# Patient Record
Sex: Female | Born: 1975 | Race: White | Marital: Single
Health system: Northeastern US, Academic
[De-identification: ages and names within clinical notes are randomized; demographics above are authoritative.]

## PROBLEM LIST (undated history)

## (undated) DIAGNOSIS — I509 Heart failure, unspecified: Secondary | ICD-10-CM

## (undated) DIAGNOSIS — C801 Malignant (primary) neoplasm, unspecified: Secondary | ICD-10-CM

## (undated) DIAGNOSIS — F329 Major depressive disorder, single episode, unspecified: Secondary | ICD-10-CM

## (undated) DIAGNOSIS — I10 Essential (primary) hypertension: Secondary | ICD-10-CM

## (undated) DIAGNOSIS — F32A Depression, unspecified: Secondary | ICD-10-CM

## (undated) DIAGNOSIS — F4310 Post-traumatic stress disorder, unspecified: Secondary | ICD-10-CM

## (undated) DIAGNOSIS — I2510 Atherosclerotic heart disease of native coronary artery without angina pectoris: Secondary | ICD-10-CM

## (undated) DIAGNOSIS — F319 Bipolar disorder, unspecified: Secondary | ICD-10-CM

## (undated) DIAGNOSIS — R569 Unspecified convulsions: Secondary | ICD-10-CM

## (undated) DIAGNOSIS — E669 Obesity, unspecified: Secondary | ICD-10-CM

## (undated) DIAGNOSIS — J45909 Unspecified asthma, uncomplicated: Secondary | ICD-10-CM

## (undated) DIAGNOSIS — F209 Schizophrenia, unspecified: Secondary | ICD-10-CM

## (undated) HISTORY — DX: Atherosclerotic heart disease of native coronary artery without angina pectoris: I25.10

## (undated) HISTORY — PX: CHOLECYSTECTOMY: SHX55

## (undated) HISTORY — PX: CARDIAC CATHETERIZATION: SHX172

---

## 2013-02-22 ENCOUNTER — Emergency Department (HOSPITAL_COMMUNITY)
Admission: EM | Admit: 2013-02-22 | Discharge: 2013-02-23 | Disposition: A | Discharge status: Home / Self Care | Payer: No Typology Code available for payment source | Attending: Emergency Medicine | Admitting: Emergency Medicine

## 2013-02-22 DIAGNOSIS — R569 Unspecified convulsions: Secondary | ICD-10-CM | POA: Insufficient documentation

## 2013-02-22 NOTE — ED Notes (Signed)
Pt states that she from [place], she took the [employer] last Monday, Pt stated that she had her medication taken from her on Sunday when she was on the [employer] bus, Pt had a seizure on arrival

## 2013-02-22 NOTE — ED Notes (Signed)
Per EMS: pt coming from the [facility] shelter with c/o seizure. The shelter reports five seizures in the last 40 minutes. First seizure pt fell out of bed and denied EMS transport. Per the pt she seized again, pt agree to transports after second seizure. Pt is not positcle, A&Ox4, respirations equal and unlabored, skin warm and dry. No signs of incontinence, oral trauma, or head trauma. Pt also c/o headache, and nausea.

## 2013-02-22 NOTE — ED Notes (Signed)
Seizure pads attached to the bed

## 2013-02-23 DIAGNOSIS — R569 Unspecified convulsions: Secondary | ICD-10-CM | POA: Diagnosis not present

## 2013-02-23 LAB — BASIC METABOLIC PANEL
BUN: 21 mg/dL (ref 6–23)
CALCIUM: 9 mg/dL (ref 8.4–10.5)
CO2: 24 meq/L (ref 19–32)
CREATININE: 0.72 mg/dL (ref 0.50–1.10)
Chloride: 104 mEq/L (ref 96–112)
GFR calc Af Amer: >90 mL/min (ref >90)
GFR calc non Af Amer: >90 mL/min (ref >90)
Glucose, Bld: 82 mg/dL (ref 70–99)
Potassium: 4.1 mEq/L (ref 3.7–5.3)
SODIUM: 140 meq/L (ref 137–147)

## 2013-02-23 LAB — CBC
HCT: 34.9 % — ABNORMAL LOW (ref 36.0–46.0)
Hemoglobin: 12 g/dL (ref 12.0–15.0)
MCH: 31.2 pg (ref 26.0–34.0)
MCHC: 34.4 g/dL (ref 30.0–36.0)
MCV: 90.6 fL (ref 78.0–100.0)
Platelets: 209 10*3/uL (ref 150–400)
RBC: 3.85 MIL/uL — ABNORMAL LOW (ref 3.87–5.11)
RDW: 13.3 % (ref 11.5–15.5)
WBC: 7.4 10*3/uL (ref 4.0–10.5)

## 2013-02-23 LAB — URINALYSIS, ROUTINE W REFLEX MICROSCOPIC
Bilirubin Urine: NEGATIVE
GLUCOSE, UA: NEGATIVE mg/dL
HGB URINE DIPSTICK: NEGATIVE
Ketones, ur: NEGATIVE mg/dL
Nitrite: NEGATIVE
PH: 5.5 (ref 5.0–8.0)
Protein, ur: NEGATIVE mg/dL
Specific Gravity, Urine: 1.01 (ref 1.005–1.030)
Urobilinogen, UA: 0.2 mg/dL (ref 0.0–1.0)

## 2013-02-23 LAB — RAPID URINE DRUG SCREEN, HOSP PERFORMED
AMPHETAMINES: NOT DETECTED
BENZODIAZEPINES: NOT DETECTED
Barbiturates: NOT DETECTED
Cocaine: NOT DETECTED
OPIATES: NOT DETECTED
Tetrahydrocannabinol: NOT DETECTED

## 2013-02-23 LAB — GLUCOSE, CAPILLARY: Glucose-Capillary: 76 mg/dL (ref 70–99)

## 2013-02-23 LAB — URINE MICROSCOPIC-ADD ON

## 2013-02-23 MED ORDER — PHENYTOIN SODIUM 50 MG/ML IJ SOLN
1000.0000 mg | Freq: Once | INTRAMUSCULAR | Status: DC
  Filled 2013-02-23: qty 20

## 2013-02-23 MED ORDER — PHENYTOIN SODIUM EXTENDED 100 MG PO CAPS
300.0000 mg | ORAL_CAPSULE | Freq: Once | ORAL | Status: AC
  Administered 2013-02-23: 300 mg via ORAL
  Filled 2013-02-23: qty 3

## 2013-02-23 MED ORDER — PHENYTOIN SODIUM EXTENDED 100 MG PO CAPS: 100.0000 mg | ORAL_CAPSULE | Freq: Every day | ORAL | Status: DC

## 2013-02-23 NOTE — ED Provider Notes (Signed)
  Physical Exam  Pulse 73  Resp 19  SpO2 100%  Physical Exam  ED Course  Procedures  MDM  Pt comes in with seizures. New to town, has no idea what meds she is on. Comes from a shelter, with what EMS thought was pseudoseizures. Basic labs ordered. She is unsure what meds she is on, but dilantin level ordered. If labs normal, for d/c.   Varney Biles, MD 02/23/13 [phone]
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
## 2013-02-23 NOTE — Discharge Instructions (Signed)
Seizure, Adult A seizure is abnormal electrical activity in the brain. Seizures usually last from 30 seconds to 2 minutes. There are various types of seizures. Before a seizure, you may have a warning sensation (aura) that a seizure is about to occur. An aura may include the following symptoms:   Fear or anxiety.  Nausea.  Feeling like the room is spinning (vertigo).  Vision changes, such as seeing flashing lights or spots. Common symptoms during a seizure include:  A change in attention or behavior (altered mental status).  Convulsions with rhythmic jerking movements.  Drooling.  Rapid eye movements.  Grunting.  Loss of bladder and bowel control.  Bitter taste in the mouth.  Tongue biting. After a seizure, you may feel confused and sleepy. You may also have an injury resulting from convulsions during the seizure. HOME CARE INSTRUCTIONS   If you are given medicines, take them exactly as prescribed by your health care provider.  Keep all follow-up appointments as directed by your health care provider.  Do not swim or drive or engage in risky activity during which a seizure could cause further injury to you or others until your health care provider says it is OK.  Get adequate rest.  Teach friends and family what to do if you have a seizure. They should:  Lay you on the ground to prevent a fall.  Put a cushion under your head.  Loosen any tight clothing around your neck.  Turn you on your side. If vomiting occurs, this helps keep your airway clear.  Stay with you until you recover.  Know whether or not you need emergency care. SEEK IMMEDIATE MEDICAL CARE IF:  The seizure lasts longer than 5 minutes.  The seizure is severe or you do not wake up immediately after the seizure.  You have an altered mental status after the seizure.  You are having more frequent or worsening seizures. Someone should drive you to the emergency department or call local emergency  services (911 in [place] MAKE SURE YOU:  Understand these instructions.  Will watch your condition.  Will get help right away if you are not doing well or get worse. Document Released: 01/12/2000 Document Revised: 11/04/2012 Document Reviewed: 08/26/2012 [employer] Patient Information 2014 [employer]  

## 2013-02-24 ENCOUNTER — Encounter (HOSPITAL_COMMUNITY): Payer: Self-pay | Admitting: Emergency Medicine

## 2013-02-24 ENCOUNTER — Emergency Department (HOSPITAL_COMMUNITY)
Admission: EM | Admit: 2013-02-24 | Discharge: 2013-02-24 | Disposition: A | Discharge status: Home / Self Care | Payer: No Typology Code available for payment source | Attending: Emergency Medicine | Admitting: Emergency Medicine

## 2013-02-24 DIAGNOSIS — M545 Low back pain, unspecified: Secondary | ICD-10-CM | POA: Insufficient documentation

## 2013-02-24 DIAGNOSIS — Z202 Contact with and (suspected) exposure to infections with a predominantly sexual mode of transmission: Secondary | ICD-10-CM | POA: Insufficient documentation

## 2013-02-24 DIAGNOSIS — Z3202 Encounter for pregnancy test, result negative: Secondary | ICD-10-CM | POA: Insufficient documentation

## 2013-02-24 DIAGNOSIS — F259 Schizoaffective disorder, unspecified: Secondary | ICD-10-CM | POA: Insufficient documentation

## 2013-02-24 DIAGNOSIS — Z791 Long term (current) use of non-steroidal anti-inflammatories (NSAID): Secondary | ICD-10-CM | POA: Insufficient documentation

## 2013-02-24 DIAGNOSIS — I10 Essential (primary) hypertension: Secondary | ICD-10-CM | POA: Insufficient documentation

## 2013-02-24 DIAGNOSIS — R4589 Other symptoms and signs involving emotional state: Secondary | ICD-10-CM

## 2013-02-24 DIAGNOSIS — R350 Frequency of micturition: Secondary | ICD-10-CM | POA: Insufficient documentation

## 2013-02-24 DIAGNOSIS — R4689 Other symptoms and signs involving appearance and behavior: Secondary | ICD-10-CM

## 2013-02-24 DIAGNOSIS — G40909 Epilepsy, unspecified, not intractable, without status epilepticus: Secondary | ICD-10-CM | POA: Insufficient documentation

## 2013-02-24 DIAGNOSIS — R45851 Suicidal ideations: Secondary | ICD-10-CM | POA: Insufficient documentation

## 2013-02-24 DIAGNOSIS — R443 Hallucinations, unspecified: Secondary | ICD-10-CM | POA: Insufficient documentation

## 2013-02-24 DIAGNOSIS — R300 Dysuria: Secondary | ICD-10-CM | POA: Insufficient documentation

## 2013-02-24 HISTORY — DX: Unspecified convulsions: R56.9

## 2013-02-24 HISTORY — DX: Major depressive disorder, single episode, unspecified: F32.9

## 2013-02-24 HISTORY — DX: Schizophrenia, unspecified: F20.9

## 2013-02-24 HISTORY — DX: Post-traumatic stress disorder, unspecified: F43.10

## 2013-02-24 HISTORY — DX: Essential (primary) hypertension: I10

## 2013-02-24 HISTORY — DX: Depression, unspecified: F32.A

## 2013-02-24 HISTORY — DX: Bipolar disorder, unspecified: F31.9

## 2013-02-24 LAB — CBC
HCT: 36.3 % (ref 36.0–46.0)
Hemoglobin: 12.2 g/dL (ref 12.0–15.0)
MCH: 30.7 pg (ref 26.0–34.0)
MCHC: 33.6 g/dL (ref 30.0–36.0)
MCV: 91.2 fL (ref 78.0–100.0)
Platelets: 218 10*3/uL (ref 150–400)
RBC: 3.98 MIL/uL (ref 3.87–5.11)
RDW: 13.3 % (ref 11.5–15.5)
WBC: 5.7 10*3/uL (ref 4.0–10.5)

## 2013-02-24 LAB — URINALYSIS, ROUTINE W REFLEX MICROSCOPIC
Bilirubin Urine: NEGATIVE
Glucose, UA: NEGATIVE mg/dL
Hgb urine dipstick: NEGATIVE
Ketones, ur: NEGATIVE mg/dL
Nitrite: NEGATIVE
Protein, ur: NEGATIVE mg/dL
Specific Gravity, Urine: 1.005 (ref 1.005–1.030)
Urobilinogen, UA: 0.2 mg/dL (ref 0.0–1.0)
pH: 7.5 (ref 5.0–8.0)

## 2013-02-24 LAB — HIV ANTIBODY (ROUTINE TESTING W REFLEX): HIV: NONREACTIVE

## 2013-02-24 LAB — COMPREHENSIVE METABOLIC PANEL
ALT: 12 U/L (ref 0–35)
AST: 16 U/L (ref 0–37)
Albumin: 3.5 g/dL (ref 3.5–5.2)
Alkaline Phosphatase: 60 U/L (ref 39–117)
BUN: 20 mg/dL (ref 6–23)
CO2: 25 mEq/L (ref 19–32)
Calcium: 9 mg/dL (ref 8.4–10.5)
Chloride: 103 mEq/L (ref 96–112)
Creatinine, Ser: 0.61 mg/dL (ref 0.50–1.10)
GFR calc Af Amer: >90 mL/min (ref >90)
GFR calc non Af Amer: >90 mL/min (ref >90)
Glucose, Bld: 93 mg/dL (ref 70–99)
Potassium: 4.5 mEq/L (ref 3.7–5.3)
Sodium: 139 mEq/L (ref 137–147)
Total Bilirubin: 0.9 mg/dL (ref 0.3–1.2)
Total Protein: 7 g/dL (ref 6.0–8.3)

## 2013-02-24 LAB — ACETAMINOPHEN LEVEL: Acetaminophen (Tylenol), Serum: <15 ug/mL (ref 10–30)

## 2013-02-24 LAB — URINE MICROSCOPIC-ADD ON

## 2013-02-24 LAB — RAPID URINE DRUG SCREEN, HOSP PERFORMED
Amphetamines: NOT DETECTED
Barbiturates: NOT DETECTED
Benzodiazepines: NOT DETECTED
Cocaine: NOT DETECTED
Opiates: NOT DETECTED
Tetrahydrocannabinol: NOT DETECTED

## 2013-02-24 LAB — RPR: RPR Ser Ql: NONREACTIVE

## 2013-02-24 LAB — SALICYLATE LEVEL: Salicylate Lvl: <2 mg/dL — ABNORMAL LOW (ref 2.8–20.0)

## 2013-02-24 LAB — ETHANOL: Alcohol, Ethyl (B): <11 mg/dL (ref 0–11)

## 2013-02-24 LAB — POCT PREGNANCY, URINE: Preg Test, Ur: NEGATIVE

## 2013-02-24 MED ORDER — CEFTRIAXONE SODIUM 250 MG IJ SOLR
250.0000 mg | Freq: Once | INTRAMUSCULAR | Status: AC
  Administered 2013-02-24: 250 mg via INTRAMUSCULAR
  Filled 2013-02-24: qty 250

## 2013-02-24 MED ORDER — RISPERIDONE 4 MG PO TABS: 4.0000 mg | ORAL_TABLET | Freq: Every day | ORAL | Status: DC

## 2013-02-24 MED ORDER — AZITHROMYCIN 250 MG PO TABS
1000.0000 mg | ORAL_TABLET | Freq: Once | ORAL | Status: AC
  Administered 2013-02-24: 1000 mg via ORAL
  Filled 2013-02-24: qty 4

## 2013-02-24 MED ORDER — ALUM & MAG HYDROXIDE-SIMETH 200-200-20 MG/5ML PO SUSP: 30.0000 mL | ORAL | Status: DC | PRN

## 2013-02-24 MED ORDER — ACETAMINOPHEN 325 MG PO TABS: 650.0000 mg | ORAL_TABLET | ORAL | Status: DC | PRN

## 2013-02-24 MED ORDER — LORAZEPAM 1 MG PO TABS: 1.0000 mg | ORAL_TABLET | Freq: Three times a day (TID) | ORAL | Status: DC | PRN

## 2013-02-24 MED ORDER — ZOLPIDEM TARTRATE 5 MG PO TABS: 5.0000 mg | ORAL_TABLET | Freq: Every evening | ORAL | Status: DC | PRN

## 2013-02-24 MED ORDER — SERTRALINE HCL 100 MG PO TABS: 200.0000 mg | ORAL_TABLET | Freq: Every day | ORAL | Status: DC

## 2013-02-24 MED ORDER — ONDANSETRON HCL 4 MG PO TABS: 4.0000 mg | ORAL_TABLET | Freq: Three times a day (TID) | ORAL | Status: DC | PRN

## 2013-02-24 MED ORDER — TRAZODONE HCL 150 MG PO TABS: 150.0000 mg | ORAL_TABLET | Freq: Every evening | ORAL | Status: DC | PRN

## 2013-02-24 MED ORDER — NICOTINE 21 MG/24HR TD PT24: 21.0000 mg | MEDICATED_PATCH | Freq: Every day | TRANSDERMAL | Status: DC

## 2013-02-24 NOTE — Discharge Instructions (Signed)
Paranoia Paranoia is a distrust of others that is not based on a real reason for distrust. This may reach delusional levels. This means the paranoid person feels the world is against them when there is no reason to make them feel that way. People with paranoia feel as though people around them are "out to get them".  SIMILAR MENTAL ILNESSES  Depression is a feeling as though you are down all the time. It is normal in some situations where you have just lost a loved one. It is abnormal if you are having feelings of paranoia with it.  Dementia is a physical problem with the brain in which the brain no longer works properly. There are problems with daily activities of living. Alzheimer's disease is one example of this. Dementia is also caused by old age changes in the brain which come with the death of brain cells and small strokes.  Paranoidschizophrenia. People with paranoid schizophrenia and persecutory delusional disorder have delusions in which they feel people around them are plotting against them. Persecutory delusions in paranoid schizophrenia are bizarre, sometimes grandiose, and often accompanied by auditory hallucinations. This means the person is hearing voices that are not there.  Delusionaldisorder (persecutory type). Delusions experienced by individuals with delusional disorder are more believable than those experienced by paranoid schizophrenics; they are not bizarre, though still unjustified. Individuals with delusional disorder may seem offbeat or quirky rather than mentally ill, and therefore, may never seek treatment. All of these problems usually do not allow these people to interact socially in an acceptable manner. CAUSES The cause of paranoia is often not known. It is common in people with extended abuse of:  Cocaine.  Amphetamine.  Marijuana.  Alcohol. Sometimes there is an inherited tendency. It may be associated with stress or changes in brain chemistry. DIAGNOSIS    When paranoia is present, your caregiver may:  Refer you to a specialist.  Do a physical exam.  Perform other tests on you to make sure there are not other problems causing the paranoia including:  Physical problems.  Mental problems.  Chemical problems (other than drugs). Testing may be done to determine if there is a psychiatric disability present that can be treated with medicine. TREATMENT   Paranoia that is a symptom of a psychiatric problem should be treated by professionals.  Medicines are available which can help this disorder. Antipsychotic medicine may be prescribed by your caregiver.  Sometimes psychotherapy may be useful.  Conditions such as depression or drug abuse are treated individually. If the paranoia is caused by drug abuse, a treatment facility may be helpful. Depression may be helped by antidepressants. PROGNOSIS   Paranoid people are difficult to treat because of their belief that everyone is out to get them or harm them. Because of this mistrust, they often must be talked into entering treatment by a trusted family member or friend. They may not want to take medicine as they may see this as an attempt to poison them.  Gradual gains in the trust of a therapist or caregiver helps in a successful treatment plan.  Some people with PPD or persecutory delusional disorder function in society without treatment in limited fashion. Document Released: 01/17/2003 Document Revised: 04/08/2011 Document Reviewed: 09/22/2007 [facility] Patient Information 2014 [place].  Schizophrenia Schizophrenia is a mental illness. It may cause disturbed or disorganized thinking, speech, or behavior. People with schizophrenia have problems functioning in one or more areas of life: work, school, home, or relationships. People with schizophrenia are  at increased risk for suicide, certain chronic physical illnesses, and unhealthy behaviors, such as smoking and drug use. People who  have family members with schizophrenia are at higher risk of developing the illness. Schizophrenia affects men and women equally but usually appears at an earlier age (teenage or early adult years) in men.  SYMPTOMS The earliest symptoms are often subtle (prodrome) and may go unnoticed until the illness becomes more severe (first-break psychosis). Symptoms of schizophrenia may be continuous or may come and go in severity. Episodes often are triggered by major life events, such as family stress, college, [employer], marriage, pregnancy or child birth, divorce, or loss of a loved one. People with schizophrenia may see, hear, or feel things that do not exist (hallucinations). They may have false beliefs in spite of obvious proof to the contrary (delusions). Sometimes speech is incoherent or behavior is odd or withdrawn.  DIAGNOSIS Schizophrenia is diagnosed through an assessment by your caregiver. Your caregiver will ask questions about your thoughts, behavior, mood, and ability to function in daily life. Your caregiver may ask questions about your medical history and use of alcohol or drugs, including prescription medication. Your caregiver may also order blood tests and imaging exams. Certain medical conditions and substances can cause symptoms that resemble schizophrenia. Your caregiver may refer you to a mental health specialist for evaluation. There are three major criterion for a diagnosis of schizophrenia:  Two or more of the following five symptoms are present for a month or longer:  Delusions. Often the delusions are that you are being attacked, harassed, cheated, persecuted or conspired against (persecutory delusions).  Hallucinations.   Disorganized speech that does not make sense to others.  Grossly disorganized (confused or unfocused) behavior or extremely overactive or underactive motor activity (catatonia).  Negative symptoms such as bland or blunted emotions (flat affect), loss of  will power (avolition), and withdrawal from social contacts (social isolation).  Level of functioning in one or more major areas of life (work, school, relationships, or self-care) is markedly below the level of functioning before the onset of illness.   There are continuous signs of illness (either mild symptoms or decreased level of functioning) for at least 6 months or longer. TREATMENT  Schizophrenia is a long-term illness. It is best controlled with continuous treatment rather than treatment only when symptoms occur. The following treatments are used to manage schizophrenia:  Medication Medication is the most effective and important form of treatment for schizophrenia. Antipsychotic medications are usually prescribed to help manage schizophrenia. Other types of medication may be added to relieve any symptoms that may occur despite the use of antipsychotic medications.  Counseling or talk therapy Individual, group, or family counseling may be helpful in providing education, support, and guidance. Many people with schizophrenia also benefit from social skills and job skills (vocational) training. A combination of medication and counseling is best for managing the disorder over time. A procedure in which electricity is applied to the brain through the scalp (electroconvulsive therapy) may be used to treat catatonic schizophrenia or schizophrenia in people who cannot take or do not respond to medication and counseling. Document Released: 01/12/2000 Document Revised: 09/16/2012 Document Reviewed: 04/08/2012 [facility] Patient Information 2014 [place].  Helping Someone Who Is Suicidal Take threats of suicide seriously. Listen to a suicidal person's thoughts and concerns with compassion. The fact that the person is talking to you is an important sign that he or she trusts you. Reasons for suicide can depend on where we  are in life.   The younger person is often depressed over lost love.  The  middle-aged person is often depressed over financial problems.  The elderly person is often depressed over health problems. SIGNS IN FAMILY OR FRIENDS WHO ARE SUICIDAL INCLUDE:  Depression which suddenly gets better. Getting over depression is usually a gradual process. A sudden change may mean the person has suddenly thought of suicide as a "solution."  A sudden loss of interest in family and friends and social withdrawal.  Loss of personal hygiene habits and not caring for himself or herself.  Decline in handling of school, work, or other activities.  Injuries which are self-inflicted, such as burning or cutting.  Expressions of helplessness, hopelessness, and a sense of the loss of ability to handle life.  Risk-taking behavior, such as casual sex and drug use. COMMON SUICIDE RISKS INCLUDE:  Death or terminal illness of a relative or friend.  Broken relationships.  Loss of health.  Financial losses.  Chemical abuse (drugs and alcohol).  Previous suicide attempts. If you do not feel adequate to listen or help, ask the person if you can help him or her get help. Ask if you can share the person's concerns with someone else such as a Medical illustrator. Just talking with someone else is helpful and you can be that help by listening. Some helpful tips are:  Listen to the person's thoughts and concerns. Let the person unburden his or her troubles on you.  Let the person know you will not let him or her be alone with the pain.  Ask the person if he or she is having thoughts of hurting himself or herself.  Ask what you can do to help lessen the pain.  Suggest that the person seek professional help and that you will assist him or her in finding help. Let the person know you will continue to be available to help. GET HELP  Contact a suicide hotline, crisis center, or local suicide prevention center for help right away. Local centers may include a hospital, clinic, community  service organization, social service provider, or health department.  Call your local emergency services (911 in the [place]).  Call a suicide hotline:  1-800-273-TALK (1-256-115-7036) in the [place].  1-800-SUICIDE 629-342-7691) in the [place].  208-079-9552 in the [place] for Spanish-speaking counselors.  E7576207 504-273-8031) in the [place] for TTY users.  Visit the following websites for information and help:  National Suicide Prevention Lifeline: www.suicidepreventionlifeline.org  Hopeline: www.hopeline.[place] for Suicide Prevention: PromotionalLoans.co.za  For lesbian, gay, bisexual, transgender, or questioning youth, contact [employer]:  Q1458887 585-598-0057) in the [place].  www.thetrevorproject.org  In [place], treatment resources are listed in each [place] with listings available under [employer] for [employer] or similar titles. Another source for Crisis Centres by [place] is located at http://www.suicideprevention.ca/in-crisis-now/find-a-crisis-centre-now/crisis-centres Document Released: 07/21/2002 Document Revised: 04/08/2011 Document Reviewed: 09/22/2007 [facility] Patient Information 2014 [place].  Self-Destructive Behavior Self-destructive behavior includes attitudes and behaviors that can harm, injure, or be destructive to the person who has or performs them. Self-destructive behavior is often used as a coping strategy to deal with painful emotions and stress. It is often habit-forming and difficult to control without help. Self-destructive behavior can result in serious injury or death.  EXAMPLES OF SELF-DESTRUCTIVE BEHAVIOR   Hurting yourself (self-injury). This includes cutting, scratching, burning, or otherwise injuring yourself to release tension or lessen emotional pain.  Binge eating and other eating disorders.  Excessive drinking.  Drug abuse.  Shopping  sprees, reckless spending, or gambling that causes you to go into debt.  Any type of addictive behavior. This includes gambling, shoplifting, and other behaviors.  Not setting healthy limits. This may include depriving yourself of proper rest and nutrition in order to meet the demands of others.  Intense or chronic feelings of anxiety, anger, impulsiveness, unworthiness, hopelessness, or loss. WHAT CAUSES SELF-DESTRUCTIVE BEHAVIOR? The underlying causes of self-destructive behavior are personal and may include:  Difficulty managing stress.  Trauma or abuse (including in past life events).  Other mental health issues, such as clinical depression and low self-esteem. WHAT SHOULD I DO IF I WANT TO HURT MYSELF?   See your health care provider or counselor. He or she will help you recognize the source of your problems, sort out your feelings, and get the help you need.  Avoid alcohol and drugs.  Try distracting yourself with other activities (such as chewing gum, exercising, cleaning, or snapping rubber bands) until you are calm and can seek help.  Learn how to deal with stress in healthy, positive ways (such as with relaxation techniques or exercise).  Learn to identify and avoid the things that trigger your self-destructive behavior.  Get involved in a local support group. SEEK MEDICAL CARE IF:   You are experiencing suicidal thoughts.  You experience illness or injury as a result of self-destructive behavior. SEEK IMMEDIATE MEDICAL CARE IF:  Your behavior is out of control and your life is in danger. Call:  The police.   Your health care provider.  Local emergency services (911 in [place] MAKE SURE YOU:  Understand these instructions.  Will watch your condition.  Will get help right away if you are not doing well or get worse. Document Released: 02/22/2004 Document Revised: 09/16/2012 Document Reviewed: 06/02/2012 [facility] Patient Information 2014 [place],  [place].  Schizoaffective Disorder Schizoaffective disorder (ScAD) is a mental illness. It causes symptoms that are a mixture of schizophrenia (a psychotic disorder) and an affective (mood) disorder. The schizophrenic symptoms may include delusions, hallucinations, or odd behavior. The mood symptoms may be similar to major depression or bipolar disorder. ScAD may interfere with personal relationships or normal daily activities. People with ScAD are at increased risk for job loss, social isolation,physical health problems, anxiety and substance use disorders, and suicide. ScAD usually occurs in cycles. Periods of severe symptoms are followed by periods of less severe symptoms or improvement. The illness affects men and women equally but usually appears at an earlier age (teenage or early adult years) in men. People who have family members with schizophrenia, bipolar disorder, or ScAD are at higher risk of developing ScAD. SYMPTOMS  At any one time, people with ScAD may have psychotic symptoms only or both psychotic and mood symptoms. The psychotic symptoms include one or more of the following:  Hearing, seeing, or feeling things that are not there (hallucinations).   Having fixed, false beliefs (delusions). The delusions usually are of being attacked, harassed, cheated, persecuted, or conspired against (paranoid delusions).  Speaking in a way that makes no sense to others (disorganized speech). The psychotic symptoms of ScAD may also include confusing or odd behavior or any of the negative symptoms of schizophrenia. These include loss of motivation for normal daily activities, such as bathing or grooming, withdrawal from other people, and lack of emotions.  The mood symptoms of ScAD occur more often than not. They resemble major depressive disorder or bipolar mania. Symptoms of major depression include  depressed mood and four or more of the following:  Loss of interest in usually pleasurable  activities (anhedonia).  Sleeping more or less than normal.  Feeling worthless or excessively guilty.  Lack of energy or motivation.  Trouble concentrating.  Eating more or less than usual.  Thinking a lot about death or suicide. Symptoms of bipolar mania include abnormally elevated or irritable mood and increased energy or activity, plus three or more of the following:   More confidence than normal or feeling that you are able to do anything (grandiosity).  Feeling rested with less sleep than normal.   Being easily distracted.   Talking more than usual or feeling pressured to keep talking.   Feeling that your thoughts are racing.  Engaging in high-risk activities such as buying sprees or foolish business decisions. DIAGNOSIS  ScAD is diagnosed through an assessment by your health care provider. Your health care provider will observe and ask questions about your thoughts, behavior, mood, and ability to function in daily life. Your health care provider may also ask questions about your medical history and use of drugs, including prescription medicines. Your health care provider may also order blood tests and imaging exams. Certain medical conditions and substances can cause symptoms that resemble ScAD. Your health care provider may refer you to a mental health specialist for evaluation.  ScAD is divided into two types. The depressive type is diagnosed if your mood symptoms are limited to major depression. The bipolar type is diagnosed if your mood symptoms are manic or a mixture of manic and depressive symptoms TREATMENT  ScAD is usually a life-long illness. Long-term treatment is necessary. The following treatments are available:  Medicine Different types of medicine are used to treat ScAD. The exact combination depends on the type and severity of your symptoms. Antipsychotic medicine is used to control psychotic symptomssuch as delusions, paranoia, and hallucinations. Mood  stabilizers can even the highs and lows of bipolar manic mood swings. Antidepressant medicines are used to treat major depressive symptoms.  Counseling or talk therapy Individual, group, or family counseling may be helpful in providing education, support, and guidance. Many people with ScAD also benefit from social skills and job skills (vocational) training. A combination of medicine and counseling is usually best for managing the disorder over time. A procedure in which electricity is applied to the brain through the scalp (electroconvulsive therapy) may be used to treat people with severe manic symptoms that do not respond to medicine and counseling. HOME CARE INSTRUCTIONS   Take all your medicine as prescribed.  Check with your health care provider before starting new prescription or over-the-counter medicines.  Keep all follow up appointments with your health care provider. SEEK MEDICAL CARE IF:   If you are not able to take your medicines as prescribed.  If your symptoms get worse. SEEK IMMEDIATE MEDICAL CARE IF:   You have serious thoughts about hurting yourself or others. Document Released: 05/27/2006 Document Revised: 11/04/2012 Document Reviewed: 08/28/2012 [facility] Patient Information 2014 [place], [place].
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
## 2013-02-24 NOTE — Progress Notes (Signed)
   CARE MANAGEMENT ED NOTE 02/24/2013  Patient:  [name] [name]   Account Number:  [id number]  Date Initiated:  02/24/2013  Documentation initiated by:  Jackelyn Poling  Subjective/Objective Assessment:   38 yr old female medicaid out of state pt who states she is from [place] but relocating to [place] without a new pcp     Subjective/Objective Assessment Detail:   Pt appreciative of resources offered     Action/Plan:   Action/Plan Detail:   Encouraged pt to contact the Dept of Social services case worker to find a local medicaid accepting primary care doctor Cm did provide her with a list of accepting [place] providers to review   Anticipated DC Date:  02/24/2013     Status Recommendation to Physician:   Result of Recommendation:    Other ED [place]  Other  PCP issues  Outpatient Services - Pt will follow up    Choice offered to / List presented to:            Status of service:  Completed, signed off  ED Comments:   ED Comments Detail:

## 2013-02-24 NOTE — Progress Notes (Signed)
Writer informed the TTS Arby Barrette) at [facility] ED of the consult request.

## 2013-02-24 NOTE — ED Notes (Signed)
Pt transferred from triage, presents with complaint of Depression, Bipolar DO, Schizophrenia, PTSD and SI.  Pt reports she has a long history hearing voices and suicidal thoughts, but has not acted on them. Pt reports she is homeless, lost her job and staying in a shelter at present. Pt calm, cooperative, interactive & pleasant at present.

## 2013-02-24 NOTE — ED Notes (Signed)
Pt eval by NP Shuvon and Dr Lovena Le. Pending DC home.

## 2013-02-24 NOTE — BHH Counselor (Signed)
Writer provided pt with following resources prior to d/c:  [place] LINE:  [phone] or [place] [address], [place] [address]  Psychiatrists [facility]   [facility] [address]    [address], [place], [place] [address]    [place], [place] [address] [phone]      [phone]  Dr. Norma Fredrickson     [facility] [address]    [place] [address]    [place] [address] [phone]      [phone]  Therapists [facility]    [facility] [address], [place]      [phone]  [facility] [address]     [address] [place] [address]    [place], [place]   Mobile Crisis Teams [facility]    Assertive  Mobile Crisis Care Unit    Psychotherapeutic Services [phone]     [address] [place], [place]) M-F 8am-3pm   [address] [place], [place] [address]   [phone] Services include: laundry, barbering, support groups, case management, phone  & computer access, showers, AA/NA mtgs, mental health/substance abuse nurse, job skills class, disability information, VA assistance, spiritual classes, etc.   [facility] [place], [place] Provides breakfast each weekday morning except Wednesdays, and an evening community meal every Friday. Access to showers is available during breakfast hours and telephones for seeking work are also provided. Also offers job referral and counseling for the homeless and unemployed.  HOMELESS  SHELTERS [facility]   [place], [place] [address]     [address], [place]  [phone]      [phone]  [facility] [place] [address]    [address] [place] [address]     [place], [place] [address] [phone]       [phone]  [facility] (women only) [address] [place], [place] [address] [place] [facility] Mobile Crisis Management - [phone]  [place] support line: [phone] in [place] call [place], [place] Counselor

## 2013-02-24 NOTE — ED Provider Notes (Signed)
Medical screening examination/treatment/procedure(s) were performed by non-physician practitioner and as supervising physician I was immediately available for consultation/collaboration.  EKG Interpretation   None        Martha K Linker, MD 02/24/13 1444 
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
## 2013-02-25 LAB — GC/CHLAMYDIA PROBE AMP
CT PROBE, AMP APTIMA: NEGATIVE
GC Probe RNA: NEGATIVE

## 2013-02-25 NOTE — Consult Note (Signed)
Face to face interview, note reviewed and agreed with

## 2013-03-01 ENCOUNTER — Emergency Department (HOSPITAL_COMMUNITY): Payer: Medicaid - Out of State

## 2013-03-01 ENCOUNTER — Encounter (HOSPITAL_COMMUNITY): Payer: Self-pay | Admitting: Emergency Medicine

## 2013-03-01 ENCOUNTER — Emergency Department (HOSPITAL_COMMUNITY)
Admission: EM | Admit: 2013-03-01 | Discharge: 2013-03-01 | Disposition: A | Discharge status: Home / Self Care | Payer: Medicaid - Out of State | Attending: Emergency Medicine | Admitting: Emergency Medicine

## 2013-03-01 DIAGNOSIS — S90829A Blister (nonthermal), unspecified foot, initial encounter: Secondary | ICD-10-CM

## 2013-03-01 DIAGNOSIS — X500XXA Overexertion from strenuous movement or load, initial encounter: Secondary | ICD-10-CM | POA: Insufficient documentation

## 2013-03-01 DIAGNOSIS — E119 Type 2 diabetes mellitus without complications: Secondary | ICD-10-CM | POA: Insufficient documentation

## 2013-03-01 DIAGNOSIS — Z8659 Personal history of other mental and behavioral disorders: Secondary | ICD-10-CM | POA: Insufficient documentation

## 2013-03-01 DIAGNOSIS — S99919A Unspecified injury of unspecified ankle, initial encounter: Principal | ICD-10-CM

## 2013-03-01 DIAGNOSIS — Z791 Long term (current) use of non-steroidal anti-inflammatories (NSAID): Secondary | ICD-10-CM | POA: Insufficient documentation

## 2013-03-01 DIAGNOSIS — S8992XA Unspecified injury of left lower leg, initial encounter: Secondary | ICD-10-CM

## 2013-03-01 DIAGNOSIS — Z79899 Other long term (current) drug therapy: Secondary | ICD-10-CM | POA: Insufficient documentation

## 2013-03-01 DIAGNOSIS — W19XXXA Unspecified fall, initial encounter: Secondary | ICD-10-CM

## 2013-03-01 DIAGNOSIS — W010XXA Fall on same level from slipping, tripping and stumbling without subsequent striking against object, initial encounter: Secondary | ICD-10-CM | POA: Insufficient documentation

## 2013-03-01 DIAGNOSIS — Z8669 Personal history of other diseases of the nervous system and sense organs: Secondary | ICD-10-CM | POA: Insufficient documentation

## 2013-03-01 DIAGNOSIS — S8990XA Unspecified injury of unspecified lower leg, initial encounter: Secondary | ICD-10-CM | POA: Insufficient documentation

## 2013-03-01 DIAGNOSIS — Y939 Activity, unspecified: Secondary | ICD-10-CM | POA: Insufficient documentation

## 2013-03-01 DIAGNOSIS — S99929A Unspecified injury of unspecified foot, initial encounter: Principal | ICD-10-CM

## 2013-03-01 DIAGNOSIS — Y929 Unspecified place or not applicable: Secondary | ICD-10-CM | POA: Insufficient documentation

## 2013-03-01 DIAGNOSIS — I10 Essential (primary) hypertension: Secondary | ICD-10-CM | POA: Insufficient documentation

## 2013-03-01 DIAGNOSIS — S99912A Unspecified injury of left ankle, initial encounter: Secondary | ICD-10-CM

## 2013-03-01 MED ORDER — AMLODIPINE BESYLATE 5 MG PO TABS: 5.0000 mg | ORAL_TABLET | Freq: Every day | ORAL | Status: DC

## 2013-03-01 MED ORDER — KETOROLAC TROMETHAMINE 60 MG/2ML IM SOLN
60.0000 mg | Freq: Once | INTRAMUSCULAR | Status: AC
  Administered 2013-03-01: 60 mg via INTRAMUSCULAR
  Filled 2013-03-01: qty 2

## 2013-03-01 MED ORDER — VALSARTAN 320 MG PO TABS: 320.0000 mg | ORAL_TABLET | Freq: Every day | ORAL | Status: DC

## 2013-03-01 MED ORDER — OXYCODONE-ACETAMINOPHEN 5-325 MG PO TABS: 1.0000 | ORAL_TABLET | Freq: Four times a day (QID) | ORAL | Status: DC | PRN

## 2013-03-01 NOTE — ED Notes (Signed)
All of patient charting done from nurses station.  Could not get computer in patient's room to come up.

## 2013-03-01 NOTE — ED Provider Notes (Signed)
Medical screening examination/treatment/procedure(s) were performed by non-physician practitioner and as supervising physician I was immediately available for consultation/collaboration.     Veryl Speak, MD 03/01/13 [phone]
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
## 2013-03-01 NOTE — ED Notes (Signed)
Patient states she fell a few days ago and hurt L ankle and L knee.   Patient states that she has had leg swelling.  Patient also complains of 5th toe pain from a blister that she got from walking.

## 2013-03-01 NOTE — Discharge Instructions (Signed)
Blisters Blisters are fluid-filled sacs that form within the skin. Common causes of blistering are friction, burns, and exposure to irritating chemicals. The fluid in the blister protects the underlying damaged skin. Most of the time it is not recommended that you open blisters. When a blister is opened, there is an increased chance for infection. Usually, a blister will open on its own. They then dry up and peel off within 10 days. If the blister is tense and uncomfortable (painful) the fluid may be drained. If it is drained the roof of the blister should be left intact. The draining should only be done by a medical professional under aseptic conditions. Poorly fitting shoes and boots can cause blisters by being too tight or too loose. Wearing extra socks or using tape, bandages, or pads over the blister-prone area helps prevent the problem by reducing friction. Blisters heal more slowly if you have diabetes or if you have problems with your circulation. You need to be careful about medical follow-up to prevent infection. HOME CARE INSTRUCTIONS  Protect areas where blisters have formed until the skin is healed. Use a special bandage with a hole cut in the middle around the blister. This reduces pressure and friction. When the blister breaks, trim off the loose skin and keep the area clean by washing it with soap daily. Soaking the blister or broken-open blister with diluted vinegar twice daily for 15 minutes will dry it up and speed the healing. Use 3 tablespoons of white vinegar per quart of water (45 mL white vinegar per liter of water). An antibiotic ointment and a bandage can be used to cover the area after soaking.  SEEK MEDICAL CARE IF:   You develop increased redness, pain, swelling, or drainage in the blistered area.  You develop a pus-like discharge from the blistered area, chills, or a fever. MAKE SURE YOU:   Understand these instructions.  Will watch your condition.  Will get help right  away if you are not doing well or get worse. Document Released: 02/22/2004 Document Revised: 04/08/2011 Document Reviewed: 01/20/2008 [facility] Patient Information 2014 [place], [place].  Multiple Traumas Bruises and sore muscles are common after injuries. These usually feel worse for the first 24 hours. You may have more stiffness and soreness over the next several hours to several days. It may be worse when you wake up the first morning following your injury. You will improve with each passing day. The amount of improvement often depends on the amount of damage done. HOME CARE INSTRUCTIONS   Put ice on the injured area.  Put ice in a plastic bag.  Place a towel between your skin and the bag.  Leave the ice on for 15-20 minutes, 03-04 times a day.  Drink more fluids than normal. Do not drink alcohol.  Take a hot or warm shower or bath once or twice a day. This will increase blood flow to sore muscles.  Return to activity as tolerated. Lifting may aggravate neck or back pain.  Only take over-the-counter or prescription medicines for pain, discomfort, or fever as directed by your caregiver. Do not use aspirin. This may increase bruising because aspirin decreases the ability of blood to form clots that stop bleeding. SEEK IMMEDIATE MEDICAL CARE IF:  You have numbness, tingling, weakness, or problems with the use of your arms or legs.  You have severe headaches not relieved with medicines.  You have changes in bowel or bladder control.  You develop increasing pain in any areas of  the body.  You have shortness of breath, dizziness, or fainting.  You have a fever.  You feel sick to your stomach (nauseous), throw up (vomit), or sweat.  You have increasing belly pain.  You have blood in your urine, stool, or vomit.  You have pain in either shoulder or in an area where a shoulder strap would be.  You feel lightheaded or faint.  You feel you may be getting worse rather than  better.  Your symptoms are worsening. MAKE SURE YOU:   Understand these instructions.  Will watch your condition.  Will get help right away if you are not doing well or get worse. Document Released: 02/03/2007 Document Revised: 04/08/2011 Document Reviewed: 07/04/2008 [facility] Patient Information 2014 [place].

## 2013-03-31 ENCOUNTER — Emergency Department (HOSPITAL_COMMUNITY): Payer: No Typology Code available for payment source

## 2013-03-31 ENCOUNTER — Emergency Department (HOSPITAL_COMMUNITY)
Admission: EM | Admit: 2013-03-31 | Discharge: 2013-03-31 | Disposition: A | Discharge status: Home / Self Care | Payer: No Typology Code available for payment source | Attending: Emergency Medicine | Admitting: Emergency Medicine

## 2013-03-31 DIAGNOSIS — I10 Essential (primary) hypertension: Secondary | ICD-10-CM | POA: Insufficient documentation

## 2013-03-31 DIAGNOSIS — Z79899 Other long term (current) drug therapy: Secondary | ICD-10-CM | POA: Insufficient documentation

## 2013-03-31 DIAGNOSIS — M1990 Unspecified osteoarthritis, unspecified site: Secondary | ICD-10-CM

## 2013-03-31 DIAGNOSIS — M1710 Unilateral primary osteoarthritis, unspecified knee: Secondary | ICD-10-CM | POA: Insufficient documentation

## 2013-03-31 DIAGNOSIS — Z8659 Personal history of other mental and behavioral disorders: Secondary | ICD-10-CM | POA: Insufficient documentation

## 2013-03-31 DIAGNOSIS — Z8781 Personal history of (healed) traumatic fracture: Secondary | ICD-10-CM | POA: Insufficient documentation

## 2013-03-31 DIAGNOSIS — Z8669 Personal history of other diseases of the nervous system and sense organs: Secondary | ICD-10-CM | POA: Insufficient documentation

## 2013-03-31 DIAGNOSIS — E119 Type 2 diabetes mellitus without complications: Secondary | ICD-10-CM | POA: Insufficient documentation

## 2013-03-31 DIAGNOSIS — IMO0002 Reserved for concepts with insufficient information to code with codable children: Secondary | ICD-10-CM | POA: Insufficient documentation

## 2013-03-31 MED ORDER — HYDROCODONE-ACETAMINOPHEN 5-325 MG PO TABS
1.0000 | ORAL_TABLET | Freq: Once | ORAL | Status: AC
  Administered 2013-03-31: 1 via ORAL
  Filled 2013-03-31: qty 1

## 2013-03-31 MED ORDER — HYDROCODONE-ACETAMINOPHEN 5-325 MG PO TABS: 1.0000 | ORAL_TABLET | ORAL | Status: DC | PRN

## 2013-03-31 NOTE — ED Notes (Signed)
Per EMS: Pt from home with c/o left leg pain. Pt reports she was walking and sudden onset of LL pain, denies injury. No swelling, obvious injury noted. 60 bpm. 22/72. 16RR.

## 2013-03-31 NOTE — ED Provider Notes (Signed)
Medical screening examination/treatment/procedure(s) were performed by non-physician practitioner and as supervising physician I was immediately available for consultation/collaboration.   EKG Interpretation None      Rolland Porter, MD, Abram Sander   Janice Norrie, MD 03/31/13 [phone]

## 2013-03-31 NOTE — Discharge Instructions (Signed)
Arthritis, Nonspecific  Arthritis is pain, redness, warmth, or puffiness (inflammation) of a joint. The joint may be stiff or hurt when you move it. One or more joints may be affected. There are many types of arthritis. Your doctor may not know what type you have right away. The most common cause of arthritis is wear and tear on the joint (osteoarthritis).  HOME CARE   · Only take medicine as told by your doctor.  · Rest the joint as much as possible.  · Raise (elevate) your joint if it is puffy.  · Use crutches if the painful joint is in your leg.  · Drink enough fluids to keep your pee (urine) clear or pale yellow.  · Follow your doctor's diet instructions.  · Use cold packs for very bad joint pain for 10 to 15 minutes every hour. Ask your doctor if it is okay for you to use hot packs.  · Exercise as told by your doctor.  · Take a warm shower if you have stiffness in the morning.  · Move your sore joints throughout the day.  GET HELP RIGHT AWAY IF:   · You have a fever.  · You have very bad joint pain, puffiness, or redness.  · You have many joints that are painful and puffy.  · You are not getting better with treatment.  · You have very bad back pain or leg weakness.  · You cannot control when you poop (bowel movement) or pee (urinate).  · You do not feel better in 24 hours or are getting worse.  · You are having side effects from your medicine.  MAKE SURE YOU:   · Understand these instructions.  · Will watch your condition.  · Will get help right away if you are not doing well or get worse.  Document Released: 04/10/2009 Document Revised: 07/16/2011 Document Reviewed: 04/10/2009  [employer]® Patient Information ©2014 [employer].

## 2013-03-31 NOTE — ED Provider Notes (Signed)
CSN: [id number]     Arrival date & time 03/31/13  1248 History  This chart was scribed for non-physician practitioner Glendell Docker, NP working with Janice Norrie, MD by Zettie Pho, ED Scribe. This patient was seen in room TR10C/TR10C and the patient's care was started at 1:09 PM.    Chief Complaint  Patient presents with  . Leg Pain   The history is provided by the patient. No language interpreter was used.   HPI Comments: [name] [name] is a 38 y.o. Female with a history of morbid obesity brought in by EMS who presents to the Emergency Department complaining of a constant pain with associated to the left leg with sudden onset while walking earlier today. She then stated that a few days ago she slipped on some ice, which is when the pain presented. Patient states that the pain is exacerbated with movement, walking/bearing weight, and touch/applied pressure. She reports a history of left ankle and left knee fracture several years ago. She reports taking hydrocodone, naprosyn, and ibuprofen at home without relief. Patient was seen here on 03/01/2013 (1 month ago) for similar complaints and received x-rays of the left knee and ankle that were negative and patient was discharged with Percocet. Patient reports an allergy to aspirin. Patient has a history of HTN, seizures, and DM. Patient also has a history of bipolar I disorder, PTSD, schizophrenia, and depression.   Past Medical History  Diagnosis Date  . Bipolar 1 disorder   . PTSD (post-traumatic stress disorder)   . Schizophrenia   . Depression   . Seizures   . Hypertension   . Diabetes mellitus without complication    No past surgical history on file. No family history on file. History  Substance Use Topics  . Smoking status: Not on file  . Smokeless tobacco: Not on file  . Alcohol Use: No   OB History   Grav Para Term Preterm Abortions TAB SAB Ect Mult Living                 Review of Systems  Musculoskeletal: Positive for  arthralgias, joint swelling and myalgias.  All other systems reviewed and are negative.      Allergies  Aspirin; Bee venom; and Sulfa antibiotics  Home Medications   Current Outpatient Rx  Name  Route  Sig  Dispense  Refill  . acetaminophen (TYLENOL) 650 MG CR tablet   Oral   Take 1,300 mg by mouth every 8 (eight) hours as needed for pain.         Marland Kitchen albuterol (PROVENTIL HFA;VENTOLIN HFA) 108 (90 BASE) MCG/ACT inhaler   Inhalation   Inhale into the lungs every 6 (six) hours as needed for wheezing or shortness of breath.         Marland Kitchen amLODipine (NORVASC) 5 MG tablet   Oral   Take 1 tablet (5 mg total) by mouth daily.   30 tablet   0   . Fluticasone-Salmeterol (ADVAIR) 500-50 MCG/DOSE AEPB   Inhalation   Inhale 1 puff into the lungs 2 (two) times daily.         . naproxen sodium (ANAPROX) 220 MG tablet   Oral   Take 220 mg by mouth 2 (two) times daily with a meal.         . oxyCODONE-acetaminophen (PERCOCET/ROXICET) 5-325 MG per tablet   Oral   Take 1 tablet by mouth every 6 (six) hours as needed for severe pain.   15 tablet  0   . valsartan (DIOVAN) 320 MG tablet   Oral   Take 1 tablet (320 mg total) by mouth daily.   30 tablet   0    Triage Vitals: BP 117/70  Pulse 82  Temp(Src) 97.8 F (36.6 C) (Oral)  Resp 20  Ht 5\' 2"  (1.575 m)  Wt 278 lb (126.1 kg)  BMI 50.83 kg/m2  SpO2 97%  Physical Exam  Nursing note and vitals reviewed. Constitutional: She is oriented to person, place, and time. She appears well-developed and well-nourished. No distress.  HENT:  Head: Normocephalic and atraumatic.  Eyes: Conjunctivae are normal.  Neck: Normal range of motion. Neck supple.  Pulmonary/Chest: Effort normal. No respiratory distress.  Abdominal: She exhibits no distension.  Musculoskeletal: Normal range of motion.  No appreciable swelling.   Neurological: She is alert and oriented to person, place, and time.  Skin: Skin is warm and dry.  Ecchymosis to  the left, lateral, lower leg.   Psychiatric: She has a normal mood and affect. Her behavior is normal.    ED Course  Procedures (including critical care time)  DIAGNOSTIC STUDIES: Oxygen Saturation is 97% on room air, normal by my interpretation.    COORDINATION OF CARE: 1:12 PM- Ordered an x-ray of the left knee and tibia/fibula. Discussed treatment plan with patient at bedside and patient verbalized agreement.     Labs Review Labs Reviewed - No data to display  Imaging Review Dg Tibia/fibula Left  03/31/2013   CLINICAL DATA:  Injury.  Leg pain.  EXAM: LEFT TIBIA AND FIBULA - 2 VIEW  COMPARISON:  DG ANKLE COMPLETE*L* dated 03/01/2013; DG KNEE COMPLETE 4 VIEWS*L* dated 03/01/2013  FINDINGS: Marginal spurring in the knee. Stable small well corticated bony structure below the lateral malleolus. Plantar calcaneal spur. Mild spurring of the distal anterior rim of the tibia.  IMPRESSION: 1. No acute bony findings. 2. Plantar calcaneal spur. 3. Degenerative spurring in the knee.   Electronically Signed   By: Sherryl Barters M.D.   On: 03/31/2013 13:54   Dg Knee Complete 4 Views Left  03/31/2013   CLINICAL DATA:  Fall with knee injury.  Distal knee pain.  EXAM: LEFT KNEE - COMPLETE 4+ VIEW  COMPARISON:  DG TIBIA/FIBULA*L* dated 03/31/2013  FINDINGS: Tricompartmental spurring. No definite knee effusion. Mild medial compartmental articular space narrowing.  IMPRESSION: 1. Mild to moderate osteoarthritis.  No acute findings.   Electronically Signed   By: Sherryl Barters M.D.   On: 03/31/2013 13:52     EKG Interpretation None      MDM   Final diagnoses:  Arthritis    No acute bony abnormality noted:will treat for pain and have follow up with ortho  I personally performed the services described in this documentation, which was scribed in my presence. The recorded information has been reviewed and is accurate.     Glendell Docker, NP 03/31/13 1402

## 2013-04-09 ENCOUNTER — Emergency Department (HOSPITAL_COMMUNITY)
Admission: EM | Admit: 2013-04-09 | Discharge: 2013-04-09 | Disposition: A | Discharge status: Home / Self Care | Payer: Medicaid - Out of State | Attending: Emergency Medicine | Admitting: Emergency Medicine

## 2013-04-09 ENCOUNTER — Encounter (HOSPITAL_COMMUNITY): Payer: Self-pay | Admitting: Emergency Medicine

## 2013-04-09 ENCOUNTER — Emergency Department (HOSPITAL_COMMUNITY): Payer: Medicaid - Out of State

## 2013-04-09 DIAGNOSIS — IMO0002 Reserved for concepts with insufficient information to code with codable children: Secondary | ICD-10-CM | POA: Insufficient documentation

## 2013-04-09 DIAGNOSIS — J45901 Unspecified asthma with (acute) exacerbation: Secondary | ICD-10-CM | POA: Insufficient documentation

## 2013-04-09 DIAGNOSIS — Z791 Long term (current) use of non-steroidal anti-inflammatories (NSAID): Secondary | ICD-10-CM | POA: Insufficient documentation

## 2013-04-09 DIAGNOSIS — I10 Essential (primary) hypertension: Secondary | ICD-10-CM | POA: Insufficient documentation

## 2013-04-09 DIAGNOSIS — Z87891 Personal history of nicotine dependence: Secondary | ICD-10-CM | POA: Insufficient documentation

## 2013-04-09 DIAGNOSIS — E119 Type 2 diabetes mellitus without complications: Secondary | ICD-10-CM | POA: Insufficient documentation

## 2013-04-09 DIAGNOSIS — Z79899 Other long term (current) drug therapy: Secondary | ICD-10-CM | POA: Insufficient documentation

## 2013-04-09 DIAGNOSIS — Z8659 Personal history of other mental and behavioral disorders: Secondary | ICD-10-CM | POA: Insufficient documentation

## 2013-04-09 LAB — CBC
HEMATOCRIT: 39.9 % (ref 36.0–46.0)
Hemoglobin: 13.6 g/dL (ref 12.0–15.0)
MCH: 31.1 pg (ref 26.0–34.0)
MCHC: 34.1 g/dL (ref 30.0–36.0)
MCV: 91.3 fL (ref 78.0–100.0)
PLATELETS: 197 10*3/uL (ref 150–400)
RBC: 4.37 MIL/uL (ref 3.87–5.11)
RDW: 13.2 % (ref 11.5–15.5)
WBC: 7.4 10*3/uL (ref 4.0–10.5)

## 2013-04-09 LAB — BASIC METABOLIC PANEL
BUN: 15 mg/dL (ref 6–23)
CALCIUM: 9.4 mg/dL (ref 8.4–10.5)
CHLORIDE: 105 meq/L (ref 96–112)
CO2: 25 meq/L (ref 19–32)
Creatinine, Ser: 0.47 mg/dL — ABNORMAL LOW (ref 0.50–1.10)
GFR calc Af Amer: >90 mL/min (ref >90)
GFR calc non Af Amer: >90 mL/min (ref >90)
Glucose, Bld: 89 mg/dL (ref 70–99)
Potassium: 5.1 mEq/L (ref 3.7–5.3)
Sodium: 141 mEq/L (ref 137–147)

## 2013-04-09 LAB — I-STAT TROPONIN, ED: Troponin i, poc: 0 ng/mL (ref 0.00–0.08)

## 2013-04-09 MED ORDER — ALBUTEROL SULFATE (2.5 MG/3ML) 0.083% IN NEBU
5.0000 mg | INHALATION_SOLUTION | Freq: Once | RESPIRATORY_TRACT | Status: AC
Start: 2013-04-09 — End: 2013-04-09
  Administered 2013-04-09: 5 mg via RESPIRATORY_TRACT
  Filled 2013-04-09: qty 6

## 2013-04-09 MED ORDER — AEROCHAMBER PLUS W/MASK MISC
1.0000 | Freq: Once | Status: AC
  Administered 2013-04-09: 1
  Filled 2013-04-09: qty 1

## 2013-04-09 MED ORDER — ALBUTEROL SULFATE HFA 108 (90 BASE) MCG/ACT IN AERS
2.0000 | INHALATION_SPRAY | Freq: Once | RESPIRATORY_TRACT | Status: AC
  Administered 2013-04-09: 2 via RESPIRATORY_TRACT
  Filled 2013-04-09: qty 6.7

## 2013-04-09 MED ORDER — ACETAMINOPHEN 500 MG PO TABS
1000.0000 mg | ORAL_TABLET | Freq: Once | ORAL | Status: AC
  Administered 2013-04-09: 1000 mg via ORAL
  Filled 2013-04-09: qty 2

## 2013-04-09 MED ORDER — FLUTICASONE-SALMETEROL 500-50 MCG/DOSE IN AEPB
1.0000 | INHALATION_SPRAY | Freq: Two times a day (BID) | RESPIRATORY_TRACT | Status: DC
Start: 2013-04-09 — End: 2013-04-30

## 2013-04-09 NOTE — ED Notes (Signed)
Pt here for sob and cp, sts recent allergies because not from this area, pt reports right side pain, and loss of voice and decrease in appetite.

## 2013-04-09 NOTE — Discharge Instructions (Signed)
Asthma, Adult Use your albuterol inhaler 2 puffs every 4 hours as needed for cough or wheeze. Return if needed more than every 4 hours Can get your Advair inhaler prescription filled at the wellness Center. He will be called with appointment time to schedule up at the wellness Center. Take Tylenol as directed for pain every 4 hours as needed Asthma is a recurring condition in which the airways tighten and narrow. Asthma can make it difficult to breathe. It can cause coughing, wheezing, and shortness of breath. Asthma episodes (also called asthma attacks) range from minor to life-threatening. Asthma cannot be cured, but medicines and lifestyle changes can help control it. CAUSES Asthma is believed to be caused by inherited (genetic) and environmental factors, but its exact cause is unknown. Asthma may be triggered by allergens, lung infections, or irritants in the air. Asthma triggers are different for each person. Common triggers include:   Animal dander.  Dust mites.  Cockroaches.  Pollen from trees or grass.  Mold.  Smoke.  Air pollutants such as dust, household cleaners, hair sprays, aerosol sprays, paint fumes, strong chemicals, or strong odors.  Cold air, weather changes, and winds (which increase molds and pollens in the air).  Strong emotional expressions such as crying or laughing hard.  Stress.  Certain medicines (such as aspirin) or types of drugs (such as beta-blockers).  Sulfites in foods and drinks. Foods and drinks that may contain sulfites include dried fruit, potato chips, and sparkling grape juice.  Infections or inflammatory conditions such as the flu, a cold, or an inflammation of the nasal membranes (rhinitis).  Gastroesophageal reflux disease (GERD).  Exercise or strenuous activity. SYMPTOMS Symptoms may occur immediately after asthma is triggered or many hours later. Symptoms include:  Wheezing.  Excessive nighttime or early morning coughing.  Frequent  or severe coughing with a common cold.  Chest tightness.  Shortness of breath. DIAGNOSIS  The diagnosis of asthma is made by a review of your medical history and a physical exam. Tests may also be performed. These may include:  Lung function studies. These tests show how much air you breath in and out.  Allergy tests.  Imaging tests such as X-rays. TREATMENT  Asthma cannot be cured, but it can usually be controlled. Treatment involves identifying and avoiding your asthma triggers. It also involves medicines. There are 2 classes of medicine used for asthma treatment:   Controller medicines. These prevent asthma symptoms from occurring. They are usually taken every day.  Reliever or rescue medicines. These quickly relieve asthma symptoms. They are used as needed and provide short-term relief. Your health care provider will help you create an asthma action plan. An asthma action plan is a written plan for managing and treating your asthma attacks. It includes a list of your asthma triggers and how they may be avoided. It also includes information on when medicines should be taken and when their dosage should be changed. An action plan may also involve the use of a device called a peak flow meter. A peak flow meter measures how well the lungs are working. It helps you monitor your condition. HOME CARE INSTRUCTIONS   Take medicine as directed by your health care provider. Speak with your health care provider if you have questions about how or when to take the medicines.  Use a peak flow meter as directed by your health care provider. Record and keep track of readings.  Understand and use the action plan to help minimize or stop an  asthma attack without needing to seek medical care.  Control your home environment in the following ways to help prevent asthma attacks:  Do not smoke. Avoid being exposed to secondhand smoke.  Change your heating and air conditioning filter regularly.  Limit  your use of fireplaces and wood stoves.  Get rid of pests (such as roaches and mice) and their droppings.  Throw away plants if you see mold on them.  Clean your floors and dust regularly. Use unscented cleaning products.  Try to have someone else vacuum for you regularly. Stay out of rooms while they are being vacuumed and for a short while afterward. If you vacuum, use a dust mask from a hardware store, a double-layered or microfilter vacuum cleaner bag, or a vacuum cleaner with a HEPA filter.  Replace carpet with wood, tile, or vinyl flooring. Carpet can trap dander and dust.  Use allergy-proof pillows, mattress covers, and box spring covers.  Wash bed sheets and blankets every week in hot water and dry them in a dryer.  Use blankets that are made of polyester or cotton.  Clean bathrooms and kitchens with bleach. If possible, have someone repaint the walls in these rooms with mold-resistant paint. Keep out of the rooms that are being cleaned and painted.  Wash hands frequently. SEEK MEDICAL CARE IF:   You have wheezing, shortness of breath, or a cough even if taking medicine to prevent attacks.  The colored mucus you cough up (sputum) is thicker than usual.  Your sputum changes from clear or white to yellow, green, gray, or bloody.  You have any problems that may be related to the medicines you are taking (such as a rash, itching, swelling, or trouble breathing).  You are using a reliever medicine more than 2 3 times per week.  Your peak flow is still at 50 79% of you personal best after following your action plan for 1 hour. SEEK IMMEDIATE MEDICAL CARE IF:   You seem to be getting worse and are unresponsive to treatment during an asthma attack.  You are short of breath even at rest.  You get short of breath when doing very little physical activity.  You have difficulty eating, drinking, or talking due to asthma symptoms.  You develop chest pain.  You develop a fast  heartbeat.  You have a bluish color to your lips or fingernails.  You are lightheaded, dizzy, or faint.  Your peak flow is less than 50% of your personal best.  You have a fever or persistent symptoms for more than 2 3 days.  You have a fever and symptoms suddenly get worse. MAKE SURE YOU:   Understand these instructions.  Will watch your condition.  Will get help right away if you are not doing well or get worse. Document Released: 01/14/2005 Document Revised: 09/16/2012 Document Reviewed: 08/13/2012 [facility] Patient Information 2014 [place], [place].

## 2013-04-09 NOTE — ED Provider Notes (Signed)
CSN: [id number]     Arrival date & time 04/09/13  1330 History   First MD Initiated Contact with Patient 04/09/13 1714     Chief Complaint  Patient presents with  . Shortness of Breath  . Chest Pain  . Allergies     (Consider location/radiation/quality/duration/timing/severity/associated sxs/prior Treatment) Patient is a 38 y.o. female presenting with shortness of breath and chest pain.  Shortness of Breath Associated symptoms: chest pain and cough   Chest Pain Associated symptoms: cough and shortness of breath    Assessment cough chest pain diffuse, worse with coughing and worse with sneezing onset 4 days ago. No fever. No other associated symptoms. She ran out of her albuterol and Advair inhalers. No treatment prior to coming here. Has trouble breathing through her nose. No other associated symptoms. Pain is mild at present. Past Medical History  Diagnosis Date  . Bipolar 1 disorder   . PTSD (post-traumatic stress disorder)   . Schizophrenia   . Depression   . Seizures   . Hypertension   . Diabetes mellitus without complication    asthma History reviewed. No pertinent past surgical history. History reviewed. No pertinent family history. History  Substance Use Topics  . Smoking status: Not on file  . Smokeless tobacco: Not on file  . Alcohol Use: No   social history former smoker. Occasional marijuana use. Former user of crack cocaine. None in several months. No alcohol. OB History   Grav Para Term Preterm Abortions TAB SAB Ect Mult Living                 Review of Systems  Constitutional: Negative.   HENT: Positive for congestion.        Hoarseness  Respiratory: Positive for cough and shortness of breath.   Cardiovascular: Positive for chest pain.  Gastrointestinal: Negative.   Genitourinary:       Postmenopausal  Musculoskeletal: Negative.   Skin: Negative.   Neurological: Negative.   Psychiatric/Behavioral: Negative.   All other systems reviewed and are  negative.      Allergies  Aspirin; Bee venom; and Sulfa antibiotics  Home Medications   Current Outpatient Rx  Name  Route  Sig  Dispense  Refill  . acetaminophen (TYLENOL) 650 MG CR tablet   Oral   Take 1,300 mg by mouth every 8 (eight) hours as needed for pain.         Marland Kitchen albuterol (PROVENTIL HFA;VENTOLIN HFA) 108 (90 BASE) MCG/ACT inhaler   Inhalation   Inhale into the lungs every 6 (six) hours as needed for wheezing or shortness of breath.         Marland Kitchen amLODipine (NORVASC) 5 MG tablet   Oral   Take 1 tablet (5 mg total) by mouth daily.   30 tablet   0   . Fluticasone-Salmeterol (ADVAIR) 500-50 MCG/DOSE AEPB   Inhalation   Inhale 1 puff into the lungs 2 (two) times daily.         Marland Kitchen HYDROcodone-acetaminophen (NORCO/VICODIN) 5-325 MG per tablet   Oral   Take 1-2 tablets by mouth every 4 (four) hours as needed.   10 tablet   0   . naproxen sodium (ANAPROX) 220 MG tablet   Oral   Take 220 mg by mouth 2 (two) times daily with a meal.         . valsartan (DIOVAN) 320 MG tablet   Oral   Take 1 tablet (320 mg total) by mouth daily.   30 tablet  0    BP 158/95  Pulse 76  Temp(Src) 98.2 F (36.8 C) (Oral)  Resp 26  Ht 5\' 5"  (1.651 m)  Wt 234 lb (106.142 kg)  BMI 38.94 kg/m2  SpO2 98% Physical Exam  Nursing note and vitals reviewed. Constitutional: She appears well-developed and well-nourished. No distress.  HENT:  Head: Normocephalic and atraumatic.  Right Ear: External ear normal.  Left Ear: External ear normal.  Mouth/Throat: Oropharynx is clear and moist.  Nasal congestion. Bilateral tympanic membranes normal  Eyes: Conjunctivae are normal. Pupils are equal, round, and reactive to light.  Neck: Neck supple. No tracheal deviation present. No thyromegaly present.  Cardiovascular: Normal rate and regular rhythm.   No murmur heard. Pulmonary/Chest: Effort normal. She has wheezes.  Mild end expiratory wheezes. Coughing occasionally. Speaks in  paragraphs no respiratory distress  Abdominal: Soft. Bowel sounds are normal. She exhibits no distension. There is no tenderness.  Morbidly obese  Musculoskeletal: Normal range of motion. She exhibits no edema and no tenderness.  Neurological: She is alert. Coordination normal.  Skin: Skin is warm and dry. No rash noted.  Psychiatric: She has a normal mood and affect.    ED Course  Procedures (including critical care time) Labs Review Labs Reviewed  BASIC METABOLIC PANEL - Abnormal; Notable for the following:    Creatinine, Ser 0.47 (*)    All other components within normal limits  CBC  I-STAT TROPOININ, ED   Imaging Review Dg Chest 2 View  04/09/2013   CLINICAL DATA:  Shortness of breath, chest pain  EXAM: CHEST  2 VIEW  COMPARISON:  None.  FINDINGS: The heart size and mediastinal contours are within normal limits. Both lungs are clear. The visualized skeletal structures are unremarkable.  IMPRESSION: No active cardiopulmonary disease.   Electronically Signed   By: Daryll Brod M.D.   On: 04/09/2013 14:24     EKG Interpretation   Date/Time:  Friday April 09 2013 13:34:29 EDT Ventricular Rate:  77 PR Interval:  166 QRS Duration: 86 QT Interval:  382 QTC Calculation: 432 R Axis:   85 Text Interpretation:  Normal sinus rhythm Early repolarization Abnormal  ECG No old tracing to compare Confirmed by Winfred Leeds  MD, Temprence Rhines [id number]) on  04/09/2013 5:33:39 PM      Results for orders placed during the hospital encounter of 04/09/13  CBC      Result Value Ref Range   WBC 7.4  4.0 - 10.5 K/uL   RBC 4.37  3.87 - 5.11 MIL/uL   Hemoglobin 13.6  12.0 - 15.0 g/dL   HCT 39.9  36.0 - 46.0 %   MCV 91.3  78.0 - 100.0 fL   MCH 31.1  26.0 - 34.0 pg   MCHC 34.1  30.0 - 36.0 g/dL   RDW 13.2  11.5 - 15.5 %   Platelets 197  150 - 400 K/uL  BASIC METABOLIC PANEL      Result Value Ref Range   Sodium 141  137 - 147 mEq/L   Potassium 5.1  3.7 - 5.3 mEq/L   Chloride 105  96 - 112 mEq/L   CO2  25  19 - 32 mEq/L   Glucose, Bld 89  70 - 99 mg/dL   BUN 15  6 - 23 mg/dL   Creatinine, Ser 0.47 (*) 0.50 - 1.10 mg/dL   Calcium 9.4  8.4 - 10.5 mg/dL   GFR calc non Af Amer >90  >90 mL/min   GFR calc Af Amer >  90  >90 mL/min  I-STAT TROPOININ, ED      Result Value Ref Range   Troponin i, poc 0.00  0.00 - 0.08 ng/mL   Comment 3            Dg Chest 2 View  04/09/2013   CLINICAL DATA:  Shortness of breath, chest pain  EXAM: CHEST  2 VIEW  COMPARISON:  None.  FINDINGS: The heart size and mediastinal contours are within normal limits. Both lungs are clear. The visualized skeletal structures are unremarkable.  IMPRESSION: No active cardiopulmonary disease.   Electronically Signed   By: Daryll Brod M.D.   On: 04/09/2013 14:24   Dg Tibia/fibula Left  03/31/2013   CLINICAL DATA:  Injury.  Leg pain.  EXAM: LEFT TIBIA AND FIBULA - 2 VIEW  COMPARISON:  DG ANKLE COMPLETE*L* dated 03/01/2013; DG KNEE COMPLETE 4 VIEWS*L* dated 03/01/2013  FINDINGS: Marginal spurring in the knee. Stable small well corticated bony structure below the lateral malleolus. Plantar calcaneal spur. Mild spurring of the distal anterior rim of the tibia.  IMPRESSION: 1. No acute bony findings. 2. Plantar calcaneal spur. 3. Degenerative spurring in the knee.   Electronically Signed   By: Sherryl Barters M.D.   On: 03/31/2013 13:54   Dg Knee Complete 4 Views Left  03/31/2013   CLINICAL DATA:  Fall with knee injury.  Distal knee pain.  EXAM: LEFT KNEE - COMPLETE 4+ VIEW  COMPARISON:  DG TIBIA/FIBULA*L* dated 03/31/2013  FINDINGS: Tricompartmental spurring. No definite knee effusion. Mild medial compartmental articular space narrowing.  IMPRESSION: 1. Mild to moderate osteoarthritis.  No acute findings.   Electronically Signed   By: Sherryl Barters M.D.   On: 03/31/2013 13:52    Chest x-ray reviewed by me 7:50 PM patient breathing much improved after treatment with albuterol nebulize treatment. She is no longer wheezing. She's only congested  in her nose. She feels ready to go home. Tylenol given for chest pain MDM  Plan the patient will be scheduled her to follow Center. Albuterol inhaler to go with spacer. Prescription Advair inhaler.tylenol for pain Final diagnoses:  None   chest pain felt to be musculoskeletal in etiology  Diagnosis #1 asthma exacerbation #2 upper respiratory tract infection #3 chest wall pain     Orlie Dakin, MD 04/09/13 2004

## 2013-04-09 NOTE — Progress Notes (Signed)
ED CM noted patient to without PCP or health insurance. Pt presented to [facility] ED with SOB and CP. Pt has had 5 ED visits in 6 months for similar complaints. In room to speak with patient.  Pt confirmed information.  Pt reports she is also homeless recently moved here from [place] and is staying at the [facility] on  [address]. Discussed the importance of PCP and f/u care not using ED for Primary Care. Pt verbalized understanding. Discussed Affordable primary care and provided a list in [facility]. Pt interested in [facility]. Offered to schedule an appt to establish care. Pt agreeable with plan. Discussed the [facility] card application can be completed at the [facility]. Verified contact phone number. Will schedule f/u appt and contact patient date and time. Pt verbalized understating teach back used. No further ED CM needs identified.

## 2013-04-13 NOTE — Progress Notes (Signed)
Called patient to inform her of her [facility] Appointment.( 3.25.15 at 11.45am) with her PCP.Left voicemail for patient and contacted number for the [facility]. No Further CM needs.

## 2013-04-15 ENCOUNTER — Encounter (HOSPITAL_COMMUNITY): Payer: Self-pay | Admitting: Emergency Medicine

## 2013-04-15 ENCOUNTER — Emergency Department (HOSPITAL_COMMUNITY)
Admission: EM | Admit: 2013-04-15 | Discharge: 2013-04-15 | Disposition: A | Discharge status: Home / Self Care | Payer: Medicaid - Out of State | Attending: Emergency Medicine | Admitting: Emergency Medicine

## 2013-04-15 DIAGNOSIS — Z8659 Personal history of other mental and behavioral disorders: Secondary | ICD-10-CM | POA: Insufficient documentation

## 2013-04-15 DIAGNOSIS — E119 Type 2 diabetes mellitus without complications: Secondary | ICD-10-CM | POA: Insufficient documentation

## 2013-04-15 DIAGNOSIS — IMO0002 Reserved for concepts with insufficient information to code with codable children: Secondary | ICD-10-CM | POA: Insufficient documentation

## 2013-04-15 DIAGNOSIS — Y939 Activity, unspecified: Secondary | ICD-10-CM | POA: Insufficient documentation

## 2013-04-15 DIAGNOSIS — Y929 Unspecified place or not applicable: Secondary | ICD-10-CM | POA: Insufficient documentation

## 2013-04-15 DIAGNOSIS — J3489 Other specified disorders of nose and nasal sinuses: Secondary | ICD-10-CM | POA: Insufficient documentation

## 2013-04-15 DIAGNOSIS — Z791 Long term (current) use of non-steroidal anti-inflammatories (NSAID): Secondary | ICD-10-CM | POA: Insufficient documentation

## 2013-04-15 DIAGNOSIS — I10 Essential (primary) hypertension: Secondary | ICD-10-CM | POA: Insufficient documentation

## 2013-04-15 DIAGNOSIS — X58XXXA Exposure to other specified factors, initial encounter: Secondary | ICD-10-CM | POA: Insufficient documentation

## 2013-04-15 DIAGNOSIS — T148XXA Other injury of unspecified body region, initial encounter: Secondary | ICD-10-CM

## 2013-04-15 DIAGNOSIS — R350 Frequency of micturition: Secondary | ICD-10-CM | POA: Insufficient documentation

## 2013-04-15 DIAGNOSIS — R319 Hematuria, unspecified: Secondary | ICD-10-CM | POA: Insufficient documentation

## 2013-04-15 DIAGNOSIS — Z3202 Encounter for pregnancy test, result negative: Secondary | ICD-10-CM | POA: Insufficient documentation

## 2013-04-15 DIAGNOSIS — Z79899 Other long term (current) drug therapy: Secondary | ICD-10-CM | POA: Insufficient documentation

## 2013-04-15 DIAGNOSIS — Z8669 Personal history of other diseases of the nervous system and sense organs: Secondary | ICD-10-CM | POA: Insufficient documentation

## 2013-04-15 DIAGNOSIS — J45909 Unspecified asthma, uncomplicated: Secondary | ICD-10-CM | POA: Insufficient documentation

## 2013-04-15 DIAGNOSIS — R109 Unspecified abdominal pain: Secondary | ICD-10-CM | POA: Insufficient documentation

## 2013-04-15 LAB — CBC WITH DIFFERENTIAL/PLATELET
BASOS PCT: 1 % (ref 0–1)
Basophils Absolute: 0 10*3/uL (ref 0.0–0.1)
Eosinophils Absolute: 0.1 10*3/uL (ref 0.0–0.7)
Eosinophils Relative: 2 % (ref 0–5)
HCT: 36.1 % (ref 36.0–46.0)
Hemoglobin: 12.7 g/dL (ref 12.0–15.0)
Lymphocytes Relative: 34 % (ref 12–46)
Lymphs Abs: 2.2 10*3/uL (ref 0.7–4.0)
MCH: 31.3 pg (ref 26.0–34.0)
MCHC: 35.2 g/dL (ref 30.0–36.0)
MCV: 88.9 fL (ref 78.0–100.0)
MONO ABS: 0.4 10*3/uL (ref 0.1–1.0)
Monocytes Relative: 5 % (ref 3–12)
NEUTROS ABS: 3.9 10*3/uL (ref 1.7–7.7)
Neutrophils Relative %: 59 % (ref 43–77)
PLATELETS: 221 10*3/uL (ref 150–400)
RBC: 4.06 MIL/uL (ref 3.87–5.11)
RDW: 12.8 % (ref 11.5–15.5)
WBC: 6.6 10*3/uL (ref 4.0–10.5)

## 2013-04-15 LAB — COMPREHENSIVE METABOLIC PANEL
ALK PHOS: 59 U/L (ref 39–117)
ALT: 12 U/L (ref 0–35)
AST: 16 U/L (ref 0–37)
Albumin: 3.4 g/dL — ABNORMAL LOW (ref 3.5–5.2)
BILIRUBIN TOTAL: 0.9 mg/dL (ref 0.3–1.2)
BUN: 19 mg/dL (ref 6–23)
CHLORIDE: 103 meq/L (ref 96–112)
CO2: 24 mEq/L (ref 19–32)
Calcium: 9.2 mg/dL (ref 8.4–10.5)
Creatinine, Ser: 0.52 mg/dL (ref 0.50–1.10)
GFR calc non Af Amer: >90 mL/min (ref >90)
GLUCOSE: 89 mg/dL (ref 70–99)
Potassium: 4.4 mEq/L (ref 3.7–5.3)
SODIUM: 140 meq/L (ref 137–147)
Total Protein: 7 g/dL (ref 6.0–8.3)

## 2013-04-15 LAB — URINALYSIS, ROUTINE W REFLEX MICROSCOPIC
BILIRUBIN URINE: NEGATIVE
GLUCOSE, UA: NEGATIVE mg/dL
HGB URINE DIPSTICK: NEGATIVE
Ketones, ur: NEGATIVE mg/dL
Nitrite: NEGATIVE
PH: 6.5 (ref 5.0–8.0)
Protein, ur: NEGATIVE mg/dL
SPECIFIC GRAVITY, URINE: 1.018 (ref 1.005–1.030)
Urobilinogen, UA: 1 mg/dL (ref 0.0–1.0)

## 2013-04-15 LAB — URINE MICROSCOPIC-ADD ON

## 2013-04-15 LAB — PREGNANCY, URINE: Preg Test, Ur: NEGATIVE

## 2013-04-15 MED ORDER — HYDROCODONE-ACETAMINOPHEN 5-325 MG PO TABS: 1.0000 | ORAL_TABLET | Freq: Four times a day (QID) | ORAL | Status: DC | PRN

## 2013-04-15 MED ORDER — CYCLOBENZAPRINE HCL 5 MG PO TABS: 5.0000 mg | ORAL_TABLET | Freq: Three times a day (TID) | ORAL | Status: DC | PRN

## 2013-04-15 MED ORDER — MORPHINE SULFATE 4 MG/ML IJ SOLN
4.0000 mg | Freq: Once | INTRAMUSCULAR | Status: AC
  Administered 2013-04-15: 4 mg via INTRAVENOUS
  Filled 2013-04-15: qty 1

## 2013-04-15 MED ORDER — ONDANSETRON HCL 4 MG/2ML IJ SOLN
4.0000 mg | Freq: Once | INTRAMUSCULAR | Status: AC
  Administered 2013-04-15: 4 mg via INTRAVENOUS
  Filled 2013-04-15: qty 2

## 2013-04-15 NOTE — ED Provider Notes (Signed)
CSN: [id number]     Arrival date & time 04/15/13  0759 History   First MD Initiated Contact with Patient 04/15/13 [id number]     Chief Complaint  Patient presents with  . Back Pain     (Consider location/radiation/quality/duration/timing/severity/associated sxs/prior Treatment) Patient is a 38 y.o. female presenting with back pain. The history is provided by the patient. No language interpreter was used.  Back Pain Associated symptoms: abdominal pain   Associated symptoms: no chest pain, no dysuria and no fever    This is a 38yo F w/ PMH schizophrenia, bipolar, depression, HTN, DM, asthma who presents w/ c/o increased urinary frequency and hematuria w/ R flank pain x yesterday. Patient notes she bent over and lifted a heavy bag yesterday at which point she felt a pain in her R middle back. This pain was mild throughout the day, then worsened at 3AM this morning when it woke her up from sleep. Patient also reports having increased urinary frequency since yesterday afternoon and at around 9PM last night had one episode of blood on the toilet paper after urination (she did not look into the toilet bowl). Patient also c/o R lower abd pain only with urination since this morning, no N/V/D. Patient notes she has not had a period since she was 38yo and thinks she went through early menopause. She is not sexually active x 5 years and is s/p tubal ligation. Denies vaginal discharge.   Denies CP, SOB, cough. She has some nasal congestion from URI she has had for about 1 week.   Patient lives in homeless shelter, but states she now has a house she will be moving into soon. She has an appointment with [facility] on 3/25.  Past Medical History  Diagnosis Date  . Bipolar 1 disorder   . PTSD (post-traumatic stress disorder)   . Schizophrenia   . Depression   . Seizures   . Hypertension   . Diabetes mellitus without complication    History reviewed. No pertinent past surgical history. No  family history on file. History  Substance Use Topics  . Smoking status: Never Smoker   . Smokeless tobacco: Not on file  . Alcohol Use: Yes   OB History   Grav Para Term Preterm Abortions TAB SAB Ect Mult Living                 Review of Systems  Constitutional: Positive for chills. Negative for fever.  HENT: Positive for congestion. Negative for sore throat.   Respiratory: Negative for cough and shortness of breath.   Cardiovascular: Negative for chest pain and leg swelling.  Gastrointestinal: Positive for abdominal pain. Negative for nausea, vomiting, diarrhea and blood in stool.  Genitourinary: Positive for frequency and hematuria. Negative for dysuria, vaginal bleeding and vaginal discharge.  Musculoskeletal: Positive for back pain.  Neurological: Negative for syncope and light-headedness.  All other systems reviewed and are negative.      Allergies  Aspirin; Bee venom; and Sulfa antibiotics  Home Medications   Current Outpatient Rx  Name  Route  Sig  Dispense  Refill  . amLODipine (NORVASC) 5 MG tablet   Oral   Take 1 tablet (5 mg total) by mouth daily.   30 tablet   0   . Fluticasone-Salmeterol (ADVAIR DISKUS) 500-50 MCG/DOSE AEPB   Inhalation   Inhale 1 puff into the lungs 2 (two) times daily.   60 each   0   . HYDROcodone-acetaminophen (NORCO/VICODIN) 5-325 MG per tablet  Oral   Take 1-2 tablets by mouth every 4 (four) hours as needed.   10 tablet   0   . naproxen sodium (ANAPROX) 220 MG tablet   Oral   Take 220 mg by mouth 2 (two) times daily with a meal.         . valsartan (DIOVAN) 320 MG tablet   Oral   Take 1 tablet (320 mg total) by mouth daily.   30 tablet   0   . acetaminophen (TYLENOL) 650 MG CR tablet   Oral   Take 1,300 mg by mouth every 8 (eight) hours as needed for pain.         Marland Kitchen albuterol (PROVENTIL HFA;VENTOLIN HFA) 108 (90 BASE) MCG/ACT inhaler   Inhalation   Inhale into the lungs every 6 (six) hours as needed for  wheezing or shortness of breath.          BP 133/54  Pulse 68  Temp(Src) 97.7 F (36.5 C)  Resp 16  SpO2 97% Physical Exam  Nursing note and vitals reviewed. Constitutional: She is oriented to person, place, and time. She appears well-developed and well-nourished. No distress.  HENT:  Head: Normocephalic and atraumatic.  Mouth/Throat: Oropharynx is clear and moist.  Eyes: Conjunctivae and EOM are normal. Pupils are equal, round, and reactive to light.  Neck: Normal range of motion. Neck supple.  Cardiovascular: Normal rate, regular rhythm and normal heart sounds.   Pulmonary/Chest: Effort normal and breath sounds normal. No respiratory distress. She has no wheezes.  Abdominal: Soft. Bowel sounds are normal. She exhibits no distension. There is tenderness.  Obese; TTP RLQ/R flank; + R CVAT  Musculoskeletal: She exhibits no edema.  BACK: ttp mid-lower paraspinal muscles  Neurological: She is alert and oriented to person, place, and time. No cranial nerve deficit.  Skin: Skin is warm and dry. She is not diaphoretic.    ED Course  Procedures (including critical care time) Labs Review Labs Reviewed  URINALYSIS, ROUTINE W REFLEX MICROSCOPIC  COMPREHENSIVE METABOLIC PANEL  CBC WITH DIFFERENTIAL  POC URINE PREG, ED   Imaging Review No results found.   EKG Interpretation None      MDM   This is a 38yo F w/ PMH schizophrenia, depression, bipolar, DM who presents with s/s c/w possible UTI vs pyelonephritis vs nephrolithiasis vs muscle strain. Will obtain UA, UPT, basic labs. Will give morphine for pain.   9:30 AM Patient told nurse she was feeling nauseated, will give zofran.   10:33 AM Patient continues to have some pain, but is doing better. UA is negative for infection. CBC/CMP unrevealing. Upon further questioning, patient's pain seems more musculoskeletal in origin as it is worsened with movement. I believe she strained a muscle when she bent over and lifted a bag  yesterday afternoon. I will discharge her with norco #20 and flexeril #30. She was instructed to return to ED with any worsening of her symptoms, especially if she develops abdominal pain, N/V, F/C, focal neurologic s/s.  Rebecca Eaton, MD 04/15/13 1037

## 2013-04-15 NOTE — Discharge Instructions (Signed)

## 2013-04-15 NOTE — Progress Notes (Signed)
CSW consult to pt regarding following up mental health provider and numerous visits to ED. Pt shared that she has no follow up since her stay at [facility]. Pt reported she has one week left of psychotropic medications. CSW shared with pt the importance of following up and taking medications as prescribed. Pt voiced understanding. CSW gave pt information and instructions on [facility]'s appt system. Pt voiced understanding. CSW reminded pt of appt at the [facility] on 04/21/13 at 11:45 am to obtain a PCP. Pt appreciative of CSW efforts.   [place], [place]

## 2013-04-15 NOTE — ED Notes (Addendum)
Rt back pain  Woke up w/ pain she states  Hurts to move and walk  And thinks  Her asthma is acting up and it hurts to void x several days  and she spotted last night she states first time in years unsure if it was vag or from bladder

## 2013-04-15 NOTE — ED Notes (Signed)
Friend at bedside pt states she forgot to tell nurse and dr that she has been dizzy and passed out yesterday wants nausea med also dr made aware of all. Social worker into see pt regarding her [facility] pt states she has been homeless but is moving states has just moved to [facility]

## 2013-04-19 NOTE — ED Provider Notes (Signed)
Patient with flank pain. Lab work reassuring. Likely musculoskeletal. Discharge home I saw and evaluated the patient, reviewed the resident's note and I agree with the findings and plan.   EKG Interpretation None        Jasper Riling. Alvino Chapel, MD 04/19/13 [phone]

## 2013-04-21 ENCOUNTER — Encounter: Payer: Self-pay | Admitting: Internal Medicine

## 2013-04-21 ENCOUNTER — Ambulatory Visit: Payer: Medicaid - Out of State | Attending: Internal Medicine | Admitting: Internal Medicine

## 2013-04-21 VITALS — BP 124/85 | HR 66 | Temp 98.5°F | Resp 18 | Ht 62.0 in | Wt 306.0 lb

## 2013-04-21 DIAGNOSIS — Z5900 Homelessness unspecified: Secondary | ICD-10-CM | POA: Insufficient documentation

## 2013-04-21 DIAGNOSIS — G8929 Other chronic pain: Secondary | ICD-10-CM | POA: Insufficient documentation

## 2013-04-21 DIAGNOSIS — M25539 Pain in unspecified wrist: Secondary | ICD-10-CM | POA: Insufficient documentation

## 2013-04-21 DIAGNOSIS — F209 Schizophrenia, unspecified: Secondary | ICD-10-CM | POA: Insufficient documentation

## 2013-04-21 DIAGNOSIS — Z882 Allergy status to sulfonamides status: Secondary | ICD-10-CM | POA: Insufficient documentation

## 2013-04-21 DIAGNOSIS — M62838 Other muscle spasm: Secondary | ICD-10-CM | POA: Insufficient documentation

## 2013-04-21 DIAGNOSIS — Z79899 Other long term (current) drug therapy: Secondary | ICD-10-CM | POA: Insufficient documentation

## 2013-04-21 DIAGNOSIS — F319 Bipolar disorder, unspecified: Secondary | ICD-10-CM | POA: Insufficient documentation

## 2013-04-21 DIAGNOSIS — E119 Type 2 diabetes mellitus without complications: Secondary | ICD-10-CM | POA: Insufficient documentation

## 2013-04-21 DIAGNOSIS — Z008 Encounter for other general examination: Secondary | ICD-10-CM | POA: Insufficient documentation

## 2013-04-21 DIAGNOSIS — M549 Dorsalgia, unspecified: Secondary | ICD-10-CM | POA: Insufficient documentation

## 2013-04-21 DIAGNOSIS — Z0000 Encounter for general adult medical examination without abnormal findings: Secondary | ICD-10-CM

## 2013-04-21 DIAGNOSIS — F4310 Post-traumatic stress disorder, unspecified: Secondary | ICD-10-CM | POA: Insufficient documentation

## 2013-04-21 DIAGNOSIS — I10 Essential (primary) hypertension: Secondary | ICD-10-CM | POA: Insufficient documentation

## 2013-04-21 MED ORDER — ACETAMINOPHEN-CODEINE #3 300-30 MG PO TABS
1.0000 | ORAL_TABLET | ORAL | Status: DC | PRN
Start: 2013-04-21 — End: 2013-04-30

## 2013-04-21 MED ORDER — AMLODIPINE BESYLATE 5 MG PO TABS: 5.0000 mg | ORAL_TABLET | Freq: Every day | ORAL | Status: DC

## 2013-04-21 MED ORDER — CYCLOBENZAPRINE HCL 5 MG PO TABS: 5.0000 mg | ORAL_TABLET | Freq: Three times a day (TID) | ORAL | Status: DC | PRN

## 2013-04-21 MED ORDER — DOXYCYCLINE HYCLATE 100 MG PO TABS: 100.0000 mg | ORAL_TABLET | Freq: Two times a day (BID) | ORAL | Status: DC

## 2013-04-21 MED ORDER — VALSARTAN 320 MG PO TABS: 320.0000 mg | ORAL_TABLET | Freq: Every day | ORAL | Status: DC

## 2013-04-21 NOTE — Progress Notes (Signed)
Pt here to establish care for chronic back pain on long term narcotics since age 38 Pt states she recently moved from [place]  And is currently homeless living in shelter Taking Vicodin and NSAIDS 9/10 chronic pain

## 2013-04-21 NOTE — Patient Instructions (Signed)

## 2013-04-21 NOTE — Progress Notes (Signed)
Patient ID: [name] [name], female   DOB: May 18, 1975, 38 y.o.   MRN: [id number]  CC: New patient  HPI: 38 year old female with past medical history of chronic back pain, wrist pain this, depression, hypertension who presented to clinic to establish primary care provider. Patient complains of generalized pain, mostly in the back and joints especially in her wrists and knees. She is homeless and lives in a shelter. She has no complaints of chest pain or shortness of breath. She has been taking blood pressure medications inconsistently due to financial difficulties and not being able to fill the medications.  Allergies  Allergen Reactions  . Aspirin Anaphylaxis and Rash  . Bee Venom Anaphylaxis and Rash  . Sulfa Antibiotics Anaphylaxis   Past Medical History  Diagnosis Date  . Bipolar 1 disorder   . PTSD (post-traumatic stress disorder)   . Schizophrenia   . Depression   . Seizures   . Hypertension   . Diabetes mellitus without complication    Current Outpatient Prescriptions on File Prior to Visit  Medication Sig Dispense Refill  . albuterol (PROVENTIL HFA;VENTOLIN HFA) 108 (90 BASE) MCG/ACT inhaler Inhale into the lungs every 6 (six) hours as needed for wheezing or shortness of breath.      . Fluticasone-Salmeterol (ADVAIR DISKUS) 500-50 MCG/DOSE AEPB Inhale 1 puff into the lungs 2 (two) times daily.  60 each  0  . HYDROcodone-acetaminophen (NORCO) 5-325 MG per tablet Take 1 tablet by mouth every 6 (six) hours as needed for moderate pain.  20 tablet  0  . acetaminophen (TYLENOL) 650 MG CR tablet Take 1,300 mg by mouth every 8 (eight) hours as needed for pain.      Marland Kitchen HYDROcodone-acetaminophen (NORCO/VICODIN) 5-325 MG per tablet Take 1-2 tablets by mouth every 4 (four) hours as needed.  10 tablet  0  . naproxen sodium (ANAPROX) 220 MG tablet Take 220 mg by mouth 2 (two) times daily with a meal.       No current facility-administered medications on file prior to visit.   Family history  significant for hypertension in parents.  History   Social History  . Marital Status: Single    Spouse Name: N/A    Number of Children: N/A  . Years of Education: N/A   Occupational History  . Not on file.   Social History Main Topics  . Smoking status: Never Smoker   . Smokeless tobacco: Not on file  . Alcohol Use: Yes  . Drug Use: No  . Sexual Activity: Not on file   Other Topics Concern  . Not on file   Social History Narrative  . No narrative on file    Review of Systems  Constitutional: Negative for fever, chills, diaphoresis, activity change, appetite change and fatigue.  HENT: Negative for ear pain, nosebleeds, congestion, facial swelling, rhinorrhea, neck pain, neck stiffness and ear discharge.   Eyes: Negative for pain, discharge, redness, itching and visual disturbance.  Respiratory: Negative for cough, choking, chest tightness, shortness of breath, wheezing and stridor.   Cardiovascular: Negative for chest pain, palpitations and leg swelling.  Gastrointestinal: Negative for abdominal distention.  Genitourinary: Negative for dysuria, urgency, frequency, hematuria, flank pain, decreased urine volume, difficulty urinating and dyspareunia.  Musculoskeletal: Per history of present illness Neurological: Negative for dizziness, tremors, seizures, syncope, facial asymmetry, speech difficulty, weakness, light-headedness, numbness and headaches.  Hematological: Negative for adenopathy. Does not bruise/bleed easily.  Psychiatric/Behavioral: Negative for hallucinations, behavioral problems, confusion, dysphoric mood, decreased concentration and agitation.  Objective:   Filed Vitals:   04/21/13 1157  BP: 124/85  Pulse: 66  Temp: 98.5 F (36.9 C)  Resp: 18    Physical Exam  Constitutional: Appears well-developed and well-nourished. No distress.  HENT: Normocephalic. External right and left ear normal. Oropharynx is clear and moist.  Eyes: Conjunctivae and EOM are  normal. PERRLA, no scleral icterus.  Neck: Normal ROM. Neck supple. No JVD. No tracheal deviation. No thyromegaly.  CVS: RRR, S1/S2 +, no murmurs, no gallops, no carotid bruit.  Pulmonary: Effort and breath sounds normal, no stridor, rhonchi, wheezes, rales.  Abdominal: Soft. BS +,  no distension, tenderness, rebound or guarding.  Musculoskeletal: Normal range of motion. No edema and no tenderness.  Lymphadenopathy: No lymphadenopathy noted, cervical, inguinal. Neuro: Alert. Normal reflexes, muscle tone coordination. No cranial nerve deficit. Skin: Skin is warm and dry. No rash noted. Not diaphoretic. No erythema. No pallor.  Psychiatric: Normal mood and affect. Behavior, judgment, thought content normal.   Lab Results  Component Value Date   WBC 6.6 04/15/2013   HGB 12.7 04/15/2013   HCT 36.1 04/15/2013   MCV 88.9 04/15/2013   PLT 221 04/15/2013   Lab Results  Component Value Date   CREATININE 0.52 04/15/2013   BUN 19 04/15/2013   NA 140 04/15/2013   K 4.4 04/15/2013   CL 103 04/15/2013   CO2 24 04/15/2013    No results found for this basename: HGBA1C   Lipid Panel  No results found for this basename: chol, trig, hdl, cholhdl, vldl, ldlcalc       Assessment and plan:   Patient Active Problem List   Diagnosis Date Noted  . Back pain, muscle spasms  04/21/2013    Priority: Medium - May use Tylenol No. 3 as well as Flexeril   . HTN (hypertension) 04/21/2013    Priority: Medium - Continue Norvasc and Diovan  - Blood pressure 124/85     Boil/ vaginal - doxycycline for 10 days

## 2013-04-30 ENCOUNTER — Emergency Department (HOSPITAL_COMMUNITY)
Admission: EM | Admit: 2013-04-30 | Discharge: 2013-04-30 | Disposition: A | Discharge status: Home / Self Care | Payer: Medicaid - Out of State | Attending: Emergency Medicine | Admitting: Emergency Medicine

## 2013-04-30 ENCOUNTER — Encounter (HOSPITAL_COMMUNITY): Payer: Self-pay | Admitting: Emergency Medicine

## 2013-04-30 ENCOUNTER — Emergency Department (HOSPITAL_COMMUNITY): Payer: Medicaid - Out of State

## 2013-04-30 DIAGNOSIS — F319 Bipolar disorder, unspecified: Secondary | ICD-10-CM | POA: Insufficient documentation

## 2013-04-30 DIAGNOSIS — Z882 Allergy status to sulfonamides status: Secondary | ICD-10-CM | POA: Insufficient documentation

## 2013-04-30 DIAGNOSIS — E119 Type 2 diabetes mellitus without complications: Secondary | ICD-10-CM | POA: Insufficient documentation

## 2013-04-30 DIAGNOSIS — K648 Other hemorrhoids: Secondary | ICD-10-CM

## 2013-04-30 DIAGNOSIS — F209 Schizophrenia, unspecified: Secondary | ICD-10-CM | POA: Insufficient documentation

## 2013-04-30 DIAGNOSIS — R3915 Urgency of urination: Secondary | ICD-10-CM | POA: Insufficient documentation

## 2013-04-30 DIAGNOSIS — F4310 Post-traumatic stress disorder, unspecified: Secondary | ICD-10-CM | POA: Insufficient documentation

## 2013-04-30 DIAGNOSIS — K921 Melena: Secondary | ICD-10-CM | POA: Insufficient documentation

## 2013-04-30 DIAGNOSIS — R109 Unspecified abdominal pain: Secondary | ICD-10-CM

## 2013-04-30 DIAGNOSIS — R569 Unspecified convulsions: Secondary | ICD-10-CM | POA: Insufficient documentation

## 2013-04-30 DIAGNOSIS — R300 Dysuria: Secondary | ICD-10-CM

## 2013-04-30 DIAGNOSIS — M549 Dorsalgia, unspecified: Secondary | ICD-10-CM

## 2013-04-30 DIAGNOSIS — Z888 Allergy status to other drugs, medicaments and biological substances status: Secondary | ICD-10-CM | POA: Insufficient documentation

## 2013-04-30 DIAGNOSIS — F411 Generalized anxiety disorder: Secondary | ICD-10-CM | POA: Insufficient documentation

## 2013-04-30 DIAGNOSIS — I10 Essential (primary) hypertension: Secondary | ICD-10-CM | POA: Insufficient documentation

## 2013-04-30 DIAGNOSIS — F3289 Other specified depressive episodes: Secondary | ICD-10-CM | POA: Insufficient documentation

## 2013-04-30 DIAGNOSIS — Z79899 Other long term (current) drug therapy: Secondary | ICD-10-CM | POA: Insufficient documentation

## 2013-04-30 DIAGNOSIS — F329 Major depressive disorder, single episode, unspecified: Secondary | ICD-10-CM | POA: Insufficient documentation

## 2013-04-30 LAB — COMPREHENSIVE METABOLIC PANEL
ALT: 16 U/L (ref 0–35)
AST: 20 U/L (ref 0–37)
Albumin: 3.9 g/dL (ref 3.5–5.2)
Alkaline Phosphatase: 69 U/L (ref 39–117)
BUN: 17 mg/dL (ref 6–23)
CALCIUM: 9.8 mg/dL (ref 8.4–10.5)
CO2: 25 mEq/L (ref 19–32)
Chloride: 105 mEq/L (ref 96–112)
Creatinine, Ser: 0.51 mg/dL (ref 0.50–1.10)
GFR calc non Af Amer: >90 mL/min (ref >90)
GLUCOSE: 87 mg/dL (ref 70–99)
Potassium: 5 mEq/L (ref 3.7–5.3)
Sodium: 143 mEq/L (ref 137–147)
TOTAL PROTEIN: 7.7 g/dL (ref 6.0–8.3)
Total Bilirubin: 1.4 mg/dL — ABNORMAL HIGH (ref 0.3–1.2)

## 2013-04-30 LAB — CBC WITH DIFFERENTIAL/PLATELET
Basophils Absolute: 0 10*3/uL (ref 0.0–0.1)
Basophils Relative: 1 % (ref 0–1)
EOS ABS: 0.2 10*3/uL (ref 0.0–0.7)
EOS PCT: 2 % (ref 0–5)
HCT: 38.5 % (ref 36.0–46.0)
HEMOGLOBIN: 13.5 g/dL (ref 12.0–15.0)
LYMPHS ABS: 2.3 10*3/uL (ref 0.7–4.0)
Lymphocytes Relative: 34 % (ref 12–46)
MCH: 31.1 pg (ref 26.0–34.0)
MCHC: 35.1 g/dL (ref 30.0–36.0)
MCV: 88.7 fL (ref 78.0–100.0)
MONOS PCT: 5 % (ref 3–12)
Monocytes Absolute: 0.3 10*3/uL (ref 0.1–1.0)
Neutro Abs: 3.9 10*3/uL (ref 1.7–7.7)
Neutrophils Relative %: 58 % (ref 43–77)
Platelets: 220 10*3/uL (ref 150–400)
RBC: 4.34 MIL/uL (ref 3.87–5.11)
RDW: 12.6 % (ref 11.5–15.5)
WBC: 6.7 10*3/uL (ref 4.0–10.5)

## 2013-04-30 LAB — URINALYSIS, ROUTINE W REFLEX MICROSCOPIC
BILIRUBIN URINE: NEGATIVE
Glucose, UA: NEGATIVE mg/dL
Hgb urine dipstick: NEGATIVE
Ketones, ur: NEGATIVE mg/dL
Leukocytes, UA: NEGATIVE
Nitrite: NEGATIVE
Protein, ur: NEGATIVE mg/dL
SPECIFIC GRAVITY, URINE: 1.015 (ref 1.005–1.030)
UROBILINOGEN UA: 1 mg/dL (ref 0.0–1.0)
pH: 6.5 (ref 5.0–8.0)

## 2013-04-30 LAB — WET PREP, GENITAL
CLUE CELLS WET PREP: NONE SEEN
Trich, Wet Prep: NONE SEEN
WBC WET PREP: NONE SEEN
Yeast Wet Prep HPF POC: NONE SEEN

## 2013-04-30 LAB — PREGNANCY, URINE: PREG TEST UR: NEGATIVE

## 2013-04-30 LAB — LIPASE, BLOOD: LIPASE: 24 U/L (ref 11–59)

## 2013-04-30 LAB — HIV ANTIBODY (ROUTINE TESTING W REFLEX): HIV: NONREACTIVE

## 2013-04-30 MED ORDER — IBUPROFEN 800 MG PO TABS: 800.0000 mg | ORAL_TABLET | Freq: Three times a day (TID) | ORAL | Status: DC | PRN

## 2013-04-30 MED ORDER — CYCLOBENZAPRINE HCL 10 MG PO TABS: 10.0000 mg | ORAL_TABLET | Freq: Two times a day (BID) | ORAL | Status: DC | PRN

## 2013-04-30 MED ORDER — ONDANSETRON HCL 4 MG/2ML IJ SOLN: 4.0000 mg | Freq: Once | INTRAMUSCULAR | Status: DC

## 2013-04-30 MED ORDER — ONDANSETRON 4 MG PO TBDP
4.0000 mg | ORAL_TABLET | Freq: Once | ORAL | Status: AC
  Administered 2013-04-30: 4 mg via ORAL
  Filled 2013-04-30: qty 1

## 2013-04-30 MED ORDER — MORPHINE SULFATE 4 MG/ML IJ SOLN
4.0000 mg | Freq: Once | INTRAMUSCULAR | Status: AC
  Administered 2013-04-30: 4 mg via INTRAMUSCULAR
  Filled 2013-04-30: qty 1

## 2013-04-30 MED ORDER — OXYCODONE-ACETAMINOPHEN 5-325 MG PO TABS
1.0000 | ORAL_TABLET | Freq: Once | ORAL | Status: AC
  Administered 2013-04-30: 1 via ORAL
  Filled 2013-04-30: qty 1

## 2013-04-30 MED ORDER — HYDROCORTISONE ACETATE 25 MG RE SUPP: 25.0000 mg | Freq: Two times a day (BID) | RECTAL | Status: DC

## 2013-04-30 MED ORDER — MORPHINE SULFATE 4 MG/ML IJ SOLN: 4.0000 mg | Freq: Once | INTRAMUSCULAR | Status: DC

## 2013-04-30 NOTE — ED Provider Notes (Signed)
CSN: [id number]     Arrival date & time 04/30/13  1051 History   First MD Initiated Contact with Patient 04/30/13 1056     Chief Complaint  Patient presents with  . Rectal Pain     (Consider location/radiation/quality/duration/timing/severity/associated sxs/prior Treatment) HPI  Pt with hx schizophrenia, bipolar disorder, diabetes, presents with right lower back pain, lower abdominal pain, and anal pain x 1 week.  Associated intermittent constipation and diarrhea as well as dysuria and urinary urgency.  Abdominal pain is sharp, comes and goes, worse with urination, bowel movements, palpation, coughing.  Subjective fevers, "feels hot." Back pain is sharp and worse with palpation.  Has blood with her bowel movements occasionally.  Denies any trauma or injury recently. Denies abnormal vaginal discharge or bleeding.  No menstrual period in 7 years, states she was previously told she had cervical cancer but never followed up.  Has been taking naprosyn, ibuprofen, friend's vicodin.  Did have some relief with vicodin.  Was seen last week at the [facility] and d/c with prescription for antibiotic for possible abscess (rectal pain.  Note states genital abscess) but states she cannot afford it.    Past Medical History  Diagnosis Date  . Bipolar 1 disorder   . PTSD (post-traumatic stress disorder)   . Schizophrenia   . Depression   . Seizures   . Hypertension   . Diabetes mellitus without complication    History reviewed. No pertinent past surgical history. History reviewed. No pertinent family history. History  Substance Use Topics  . Smoking status: Never Smoker   . Smokeless tobacco: Not on file  . Alcohol Use: Yes   OB History   Grav Para Term Preterm Abortions TAB SAB Ect Mult Living                 Review of Systems  Constitutional: Negative for fever.  Respiratory: Negative for cough and shortness of breath.   Cardiovascular: Negative for chest pain.  Gastrointestinal:  Positive for abdominal pain, diarrhea, constipation and blood in stool. Negative for nausea and vomiting.  Genitourinary: Positive for dysuria and urgency. Negative for vaginal bleeding and vaginal discharge.  Musculoskeletal: Positive for back pain.  All other systems reviewed and are negative.      Allergies  Aspirin; Bee venom; and Sulfa antibiotics  Home Medications   Current Outpatient Rx  Name  Route  Sig  Dispense  Refill  . acetaminophen (TYLENOL) 650 MG CR tablet   Oral   Take 1,300 mg by mouth every 8 (eight) hours as needed for pain.         Marland Kitchen acetaminophen-codeine (TYLENOL #3) 300-30 MG per tablet   Oral   Take 1-2 tablets by mouth every 4 (four) hours as needed for moderate pain.   45 tablet   0   . albuterol (PROVENTIL HFA;VENTOLIN HFA) 108 (90 BASE) MCG/ACT inhaler   Inhalation   Inhale into the lungs every 6 (six) hours as needed for wheezing or shortness of breath.         Marland Kitchen amLODipine (NORVASC) 5 MG tablet   Oral   Take 1 tablet (5 mg total) by mouth daily.   30 tablet   3   . cyclobenzaprine (FLEXERIL) 5 MG tablet   Oral   Take 1 tablet (5 mg total) by mouth 3 (three) times daily as needed for muscle spasms.   30 tablet   3   . doxycycline (VIBRA-TABS) 100 MG tablet   Oral  Take 1 tablet (100 mg total) by mouth 2 (two) times daily.   20 tablet   0   . Fluticasone-Salmeterol (ADVAIR DISKUS) 500-50 MCG/DOSE AEPB   Inhalation   Inhale 1 puff into the lungs 2 (two) times daily.   60 each   0   . HYDROcodone-acetaminophen (NORCO) 5-325 MG per tablet   Oral   Take 1 tablet by mouth every 6 (six) hours as needed for moderate pain.   20 tablet   0   . HYDROcodone-acetaminophen (NORCO/VICODIN) 5-325 MG per tablet   Oral   Take 1-2 tablets by mouth every 4 (four) hours as needed.   10 tablet   0   . naproxen sodium (ANAPROX) 220 MG tablet   Oral   Take 220 mg by mouth 2 (two) times daily with a meal.         . valsartan (DIOVAN)  320 MG tablet   Oral   Take 1 tablet (320 mg total) by mouth daily.   30 tablet   3    BP 128/93  Pulse 73  Temp(Src) 97.6 F (36.4 C) (Oral)  Resp 16  Ht 5\' 2"  (1.575 m)  Wt 306 lb (138.801 kg)  BMI 55.95 kg/m2  SpO2 98% Physical Exam  Nursing note and vitals reviewed. Constitutional: She appears well-developed and well-nourished. No distress.  HENT:  Head: Normocephalic and atraumatic.  Eyes: Conjunctivae are normal.  Neck: Neck supple.  Cardiovascular: Normal rate and regular rhythm.   Pulmonary/Chest: Effort normal and breath sounds normal. No respiratory distress. She has no wheezes. She has no rales.  Abdominal: Soft. Bowel sounds are normal. She exhibits no distension. There is tenderness in the right lower quadrant, suprapubic area, left upper quadrant and left lower quadrant. There is no rebound, no guarding and no CVA tenderness.  obese  Genitourinary: Vagina normal. Rectal exam shows internal hemorrhoid and tenderness. Rectal exam shows no external hemorrhoid and anal tone normal. No vaginal discharge found.  Musculoskeletal:       Arms: Spine nontender, no crepitus, or stepoffs.   Neurological: She is alert.  Skin: She is not diaphoretic.  Psychiatric: Her mood appears anxious.  Speaks quickly, jumps topics    ED Course  Procedures (including critical care time) Labs Review Labs Reviewed  COMPREHENSIVE METABOLIC PANEL - Abnormal; Notable for the following:    Total Bilirubin 1.4 (*)    All other components within normal limits  WET PREP, GENITAL  GC/CHLAMYDIA PROBE AMP  URINE CULTURE  CBC WITH DIFFERENTIAL  LIPASE, BLOOD  URINALYSIS, ROUTINE W REFLEX MICROSCOPIC  PREGNANCY, URINE  HIV ANTIBODY (ROUTINE TESTING)   Imaging Review US Transvaginal Non-ob  04/30/2013   CLINICAL DATA:  Pelvic pain.  EXAM: TRANSABDOMINAL AND TRANSVAGINAL ULTRASOUND OF PELVIS  TECHNIQUE: Both transabdominal and transvaginal ultrasound examinations of the pelvis were  performed. Transabdominal technique was performed for global imaging of the pelvis including uterus, ovaries, adnexal regions, and pelvic cul-de-sac. It was necessary to proceed with endovaginal exam following the transabdominal exam to visualize the endometrium, ovaries, and adnexa.  COMPARISON:  None  FINDINGS: Uterus  Measurements: 8.8 x 3.7 x 4.4 cm. No fibroids or other mass visualized.  Endometrium  Thickness: 3.6 mm.  No focal abnormality visualized.  Right ovary  Not visualized.  Left ovary  Not visualized.  Other findings  No free fluid.  The study was limited due to pain during the transvaginal exam and due to the patient's body habitus.  IMPRESSION: Normal-appearing uterus  and endometrium. The ovaries could not be identified.   Electronically Signed   By: Rozetta Nunnery M.D.   On: 04/30/2013 13:47   US Pelvis Complete  04/30/2013   CLINICAL DATA:  Pelvic pain.  EXAM: TRANSABDOMINAL AND TRANSVAGINAL ULTRASOUND OF PELVIS  TECHNIQUE: Both transabdominal and transvaginal ultrasound examinations of the pelvis were performed. Transabdominal technique was performed for global imaging of the pelvis including uterus, ovaries, adnexal regions, and pelvic cul-de-sac. It was necessary to proceed with endovaginal exam following the transabdominal exam to visualize the endometrium, ovaries, and adnexa.  COMPARISON:  None  FINDINGS: Uterus  Measurements: 8.8 x 3.7 x 4.4 cm. No fibroids or other mass visualized.  Endometrium  Thickness: 3.6 mm.  No focal abnormality visualized.  Right ovary  Not visualized.  Left ovary  Not visualized.  Other findings  No free fluid.  The study was limited due to pain during the transvaginal exam and due to the patient's body habitus.  IMPRESSION: Normal-appearing uterus and endometrium. The ovaries could not be identified.   Electronically Signed   By: Rozetta Nunnery M.D.   On: 04/30/2013 13:47     EKG Interpretation None      MDM   Final diagnoses:  Abdominal pain  Back pain    Internal hemorrhoids  Dysuria    Pt with multiple complaints, 7 ED visits in the past 4 months with various complaints.  Pt does have internal hemorrhoids.  Doubt perirectal abscess.  She also has right sided low back pain - no red flags.  Suspect muscular pain.  Also with diffuse lower abdominal pain.  Labs, urine are unremarkable.  Bilirubin is newly elevated however - no RUQ pain, no vomiting, no fever, WBC normal, AST/ALT normal.  Doubt cholecystitis.  Pelvic exam bimanual reproduced pain - no abnormalities noted on pelvic except tenderness.  Pt states she was previously told she had cervical cancer but never followed up - I have encouraged Gyn follow up.  Her [place] did not show the ovaries but otherwise was unremarkable.  UA shows no infection.  Doubt appendicitis.  Urine sent for culture.  Discussed result, findings, treatment, and follow up  with patient.  Pt given return precautions.  Pt verbalizes understanding and agrees with plan.        Clayton Bibles, PA-C 04/30/13 1537

## 2013-04-30 NOTE — Discharge Instructions (Signed)
Read the information below.  Use the prescribed medication as directed.  Please discuss all new medications with your pharmacist.  You may return to the Emergency Department at any time for worsening condition or any new symptoms that concern you.  If you develop high fevers, worsening abdominal pain, uncontrolled vomiting, or are unable to tolerate fluids by mouth, return to the ER for a recheck.     Abdominal Pain, Adult Many things can cause abdominal pain. Usually, abdominal pain is not caused by a disease and will improve without treatment. It can often be observed and treated at home. Your health care provider will do a physical exam and possibly order blood tests and X-rays to help determine the seriousness of your pain. However, in many cases, more time must pass before a clear cause of the pain can be found. Before that point, your health care provider may not know if you need more testing or further treatment. HOME CARE INSTRUCTIONS  Monitor your abdominal pain for any changes. The following actions may help to alleviate any discomfort you are experiencing:  Only take over-the-counter or prescription medicines as directed by your health care provider.  Do not take laxatives unless directed to do so by your health care provider.  Try a clear liquid diet (broth, tea, or water) as directed by your health care provider. Slowly move to a bland diet as tolerated. SEEK MEDICAL CARE IF:  You have unexplained abdominal pain.  You have abdominal pain associated with nausea or diarrhea.  You have pain when you urinate or have a bowel movement.  You experience abdominal pain that wakes you in the night.  You have abdominal pain that is worsened or improved by eating food.  You have abdominal pain that is worsened with eating fatty foods. SEEK IMMEDIATE MEDICAL CARE IF:   Your pain does not go away within 2 hours.  You have a fever.  You keep throwing up (vomiting).  Your pain is felt  only in portions of the abdomen, such as the right side or the left lower portion of the abdomen.  You pass bloody or black tarry stools. MAKE SURE YOU:  Understand these instructions.   Will watch your condition.   Will get help right away if you are not doing well or get worse.  Document Released: 10/24/2004 Document Revised: 11/04/2012 Document Reviewed: 09/23/2012 [facility] Patient Information 2014 [place].  Back Pain, Adult Low back pain is very common. About 1 in 5 people have back pain.The cause of low back pain is rarely dangerous. The pain often gets better over time.About half of people with a sudden onset of back pain feel better in just 2 weeks. About 8 in 10 people feel better by 6 weeks.  CAUSES Some common causes of back pain include:  Strain of the muscles or ligaments supporting the spine.  Wear and tear (degeneration) of the spinal discs.  Arthritis.  Direct injury to the back. DIAGNOSIS Most of the time, the direct cause of low back pain is not known.However, back pain can be treated effectively even when the exact cause of the pain is unknown.Answering your caregiver's questions about your overall health and symptoms is one of the most accurate ways to make sure the cause of your pain is not dangerous. If your caregiver needs more information, he or she may order lab work or imaging tests (X-rays or MRIs).However, even if imaging tests show changes in your back, this usually does not require surgery.  HOME CARE INSTRUCTIONS For many people, back pain returns.Since low back pain is rarely dangerous, it is often a condition that people can learn to [facility] their own.   Remain active. It is stressful on the back to sit or stand in one place. Do not sit, drive, or stand in one place for more than 30 minutes at a time. Take short walks on level surfaces as soon as pain allows.Try to increase the length of time you walk each day.  Do not stay in  bed.Resting more than 1 or 2 days can delay your recovery.  Do not avoid exercise or work.Your body is made to move.It is not dangerous to be active, even though your back may hurt.Your back will likely heal faster if you return to being active before your pain is gone.  Pay attention to your body when you bend and lift. Many people have less discomfortwhen lifting if they bend their knees, keep the load close to their bodies,and avoid twisting. Often, the most comfortable positions are those that put less stress on your recovering back.  Find a comfortable position to sleep. Use a firm mattress and lie on your side with your knees slightly bent. If you lie on your back, put a pillow under your knees.  Only take over-the-counter or prescription medicines as directed by your caregiver. Over-the-counter medicines to reduce pain and inflammation are often the most helpful.Your caregiver may prescribe muscle relaxant drugs.These medicines help dull your pain so you can more quickly return to your normal activities and healthy exercise.  Put ice on the injured area.  Put ice in a plastic bag.  Place a towel between your skin and the bag.  Leave the ice on for 15-20 minutes, 03-04 times a day for the first 2 to 3 days. After that, ice and heat may be alternated to reduce pain and spasms.  Ask your caregiver about trying back exercises and gentle massage. This may be of some benefit.  Avoid feeling anxious or stressed.Stress increases muscle tension and can worsen back pain.It is important to recognize when you are anxious or stressed and learn ways to manage it.Exercise is a great option. SEEK MEDICAL CARE IF:  You have pain that is not relieved with rest or medicine.  You have pain that does not improve in 1 week.  You have new symptoms.  You are generally not feeling well. SEEK IMMEDIATE MEDICAL CARE IF:   You have pain that radiates from your back into your legs.  You  develop new bowel or bladder control problems.  You have unusual weakness or numbness in your arms or legs.  You develop nausea or vomiting.  You develop abdominal pain.  You feel faint. Document Released: 01/14/2005 Document Revised: 07/16/2011 Document Reviewed: 06/04/2010 [facility] Patient Information 2014 [place].  Dysuria Dysuria is the medical term for pain with urination. There are many causes for dysuria, but urinary tract infection is the most common. If a urinalysis was performed it can show that there is a urinary tract infection. A urine culture confirms that you or your child is sick. You will need to follow up with a healthcare provider because:  If a urine culture was done you will need to know the culture results and treatment recommendations.  If the urine culture was positive, you or your child will need to be put on antibiotics or know if the antibiotics prescribed are the right antibiotics for your urinary tract infection.  If the urine  culture is negative (no urinary tract infection), then other causes may need to be explored or antibiotics need to be stopped. Today laboratory work may have been done and there does not seem to be an infection. If cultures were done they will take at least 24 to 48 hours to be completed. Today x-rays may have been taken and they read as normal. No cause can be found for the problems. The x-rays may be re-read by a radiologist and you will be contacted if additional findings are made. You or your child may have been put on medications to help with this problem until you can see your primary caregiver. If the problems get better, see your primary caregiver if the problems return. If you were given antibiotics (medications which kill germs), take all of the mediations as directed for the full course of treatment.  If laboratory work was done, you need to find the results. Leave a telephone number where you can be reached. If this is not  possible, make sure you find out how you are to get test results. HOME CARE INSTRUCTIONS   Drink lots of fluids. For adults, drink eight, 8 ounce glasses of clear juice or water a day. For children, replace fluids as suggested by your caregiver.  Empty the bladder often. Avoid holding urine for long periods of time.  After a bowel movement, women should cleanse front to back, using each tissue only once.  Empty your bladder before and after sexual intercourse.  Take all the medicine given to you until it is gone. You may feel better in a few days, but TAKE ALL MEDICINE.  Avoid caffeine, tea, alcohol and carbonated beverages, because they tend to irritate the bladder.  In men, alcohol may irritate the prostate.  Only take over-the-counter or prescription medicines for pain, discomfort, or fever as directed by your caregiver.  If your caregiver has given you a follow-up appointment, it is very important to keep that appointment. Not keeping the appointment could result in a chronic or permanent injury, pain, and disability. If there is any problem keeping the appointment, you must call back to this facility for assistance. SEEK IMMEDIATE MEDICAL CARE IF:   Back pain develops.  A fever develops.  There is nausea (feeling sick to your stomach) or vomiting (throwing up).  Problems are no better with medications or are getting worse. MAKE SURE YOU:   Understand these instructions.  Will watch your condition.  Will get help right away if you are not doing well or get worse. Document Released: 10/13/2003 Document Revised: 04/08/2011 Document Reviewed: 08/20/2007 [facility]  Patient Information 2014 [place].  Hemorrhoids Hemorrhoids are puffy (swollen) veins around the rectum or anus. Hemorrhoids can cause pain, itching, bleeding, or irritation. HOME CARE  Eat foods with fiber, such as whole grains, beans, nuts, fruits, and vegetables. Ask your doctor about taking products with  added fiber in them (fibersupplements).  Drink enough fluid to keep your pee (urine) clear or pale yellow.  Exercise often.  Go to the bathroom when you have the urge to poop. Do not wait.  Avoid straining to poop (bowel movement).  Keep the butt area dry and clean. Use wet toilet paper or moist paper towels.  Medicated creams and medicine inserted into the anus (anal suppository) may be used or applied as told.  Only take medicine as told by your doctor.  Take a warm water bath (sitz bath) for 15 20 minutes to ease pain. Do this 3 4  times a day.  Place ice packs on the area if it is tender or puffy. Use the ice packs between the warm water baths.  Put ice in a plastic bag.  Place a towel between your skin and the bag.  Leave the ice on for 15-20 minutes, 03-04 times a day.  Do not use a donut-shaped pillow or sit on the toilet for a long time. GET HELP RIGHT AWAY IF:   You have more pain that is not controlled by treatment or medicine.  You have bleeding that will not stop.  You have trouble or are unable to poop (bowel movement).  You have pain or puffiness outside the area of the hemorrhoids. MAKE SURE YOU:   Understand these instructions.  Will watch your condition.  Will get help right away if you are not doing well or get worse. Document Released: 10/24/2007 Document Revised: 01/01/2012 Document Reviewed: 11/26/2011 [facility] Patient Information 2014 [place], [place].

## 2013-04-30 NOTE — ED Notes (Signed)
[facility] [place] at bedside  

## 2013-04-30 NOTE — ED Provider Notes (Signed)
Medical screening examination/treatment/procedure(s) were performed by non-physician practitioner and as supervising physician I was immediately available for consultation/collaboration.  Richarda Blade, MD 04/30/13 1600

## 2013-04-30 NOTE — ED Notes (Signed)
Social Worker notified pt needs bus pass to get home.

## 2013-04-30 NOTE — ED Notes (Addendum)
[name] [name] presents to the ED with c/o rectal pain that has been ongoing x 1 week.  [name] [name] rectum was visualized and there is no bleeding, skin is intact around the rectum.  [name] [name] also c/o of right lower back pain that radiates to the flank area.  [name] [name] states the right back pain has been ongoing for a couple of days that worsened last night.

## 2013-05-01 LAB — GC/CHLAMYDIA PROBE AMP
CT Probe RNA: NEGATIVE
GC PROBE AMP APTIMA: NEGATIVE

## 2013-05-01 LAB — URINE CULTURE: Colony Count: 45000

## 2013-05-05 ENCOUNTER — Emergency Department (HOSPITAL_COMMUNITY)
Admission: EM | Admit: 2013-05-05 | Discharge: 2013-05-06 | Disposition: A | Discharge status: Home / Self Care | Payer: Medicaid - Out of State | Attending: Emergency Medicine | Admitting: Emergency Medicine

## 2013-05-05 ENCOUNTER — Encounter (HOSPITAL_COMMUNITY): Payer: Self-pay | Admitting: Emergency Medicine

## 2013-05-05 ENCOUNTER — Emergency Department (HOSPITAL_COMMUNITY): Payer: Medicaid - Out of State

## 2013-05-05 DIAGNOSIS — S63502A Unspecified sprain of left wrist, initial encounter: Secondary | ICD-10-CM

## 2013-05-05 DIAGNOSIS — Z791 Long term (current) use of non-steroidal anti-inflammatories (NSAID): Secondary | ICD-10-CM | POA: Insufficient documentation

## 2013-05-05 DIAGNOSIS — S63509A Unspecified sprain of unspecified wrist, initial encounter: Secondary | ICD-10-CM | POA: Insufficient documentation

## 2013-05-05 DIAGNOSIS — G8929 Other chronic pain: Secondary | ICD-10-CM | POA: Insufficient documentation

## 2013-05-05 DIAGNOSIS — Z3202 Encounter for pregnancy test, result negative: Secondary | ICD-10-CM | POA: Insufficient documentation

## 2013-05-05 DIAGNOSIS — IMO0002 Reserved for concepts with insufficient information to code with codable children: Secondary | ICD-10-CM | POA: Insufficient documentation

## 2013-05-05 DIAGNOSIS — Y9389 Activity, other specified: Secondary | ICD-10-CM | POA: Insufficient documentation

## 2013-05-05 DIAGNOSIS — E119 Type 2 diabetes mellitus without complications: Secondary | ICD-10-CM | POA: Insufficient documentation

## 2013-05-05 DIAGNOSIS — R569 Unspecified convulsions: Secondary | ICD-10-CM | POA: Insufficient documentation

## 2013-05-05 DIAGNOSIS — I10 Essential (primary) hypertension: Secondary | ICD-10-CM | POA: Insufficient documentation

## 2013-05-05 DIAGNOSIS — Z8659 Personal history of other mental and behavioral disorders: Secondary | ICD-10-CM | POA: Insufficient documentation

## 2013-05-05 DIAGNOSIS — Y929 Unspecified place or not applicable: Secondary | ICD-10-CM | POA: Insufficient documentation

## 2013-05-05 DIAGNOSIS — R296 Repeated falls: Secondary | ICD-10-CM | POA: Insufficient documentation

## 2013-05-05 LAB — URINALYSIS, ROUTINE W REFLEX MICROSCOPIC
Bilirubin Urine: NEGATIVE
Glucose, UA: NEGATIVE mg/dL
Hgb urine dipstick: NEGATIVE
Ketones, ur: NEGATIVE mg/dL
LEUKOCYTES UA: NEGATIVE
NITRITE: NEGATIVE
Protein, ur: NEGATIVE mg/dL
SPECIFIC GRAVITY, URINE: 1.01 (ref 1.005–1.030)
Urobilinogen, UA: 0.2 mg/dL (ref 0.0–1.0)
pH: 6 (ref 5.0–8.0)

## 2013-05-05 LAB — I-STAT CHEM 8, ED
BUN: 21 mg/dL (ref 6–23)
CREATININE: 0.9 mg/dL (ref 0.50–1.10)
Calcium, Ion: 1.22 mmol/L (ref 1.12–1.23)
Chloride: 104 mEq/L (ref 96–112)
GLUCOSE: 89 mg/dL (ref 70–99)
HCT: 38 % (ref 36.0–46.0)
HEMOGLOBIN: 12.9 g/dL (ref 12.0–15.0)
Potassium: 4.4 mEq/L (ref 3.7–5.3)
Sodium: 141 mEq/L (ref 137–147)
TCO2: 26 mmol/L (ref 0–100)

## 2013-05-05 LAB — CBC WITH DIFFERENTIAL/PLATELET
Basophils Absolute: 0 10*3/uL (ref 0.0–0.1)
Basophils Relative: 1 % (ref 0–1)
EOS ABS: 0.2 10*3/uL (ref 0.0–0.7)
EOS PCT: 3 % (ref 0–5)
HEMATOCRIT: 36.2 % (ref 36.0–46.0)
Hemoglobin: 12.7 g/dL (ref 12.0–15.0)
LYMPHS ABS: 2.9 10*3/uL (ref 0.7–4.0)
LYMPHS PCT: 39 % (ref 12–46)
MCH: 30.9 pg (ref 26.0–34.0)
MCHC: 35.1 g/dL (ref 30.0–36.0)
MCV: 88.1 fL (ref 78.0–100.0)
MONO ABS: 0.5 10*3/uL (ref 0.1–1.0)
MONOS PCT: 6 % (ref 3–12)
Neutro Abs: 3.8 10*3/uL (ref 1.7–7.7)
Neutrophils Relative %: 51 % (ref 43–77)
Platelets: 223 10*3/uL (ref 150–400)
RBC: 4.11 MIL/uL (ref 3.87–5.11)
RDW: 12.5 % (ref 11.5–15.5)
WBC: 7.4 10*3/uL (ref 4.0–10.5)

## 2013-05-05 LAB — RAPID URINE DRUG SCREEN, HOSP PERFORMED
Amphetamines: NOT DETECTED
BARBITURATES: NOT DETECTED
Benzodiazepines: NOT DETECTED
Cocaine: NOT DETECTED
Opiates: NOT DETECTED
Tetrahydrocannabinol: NOT DETECTED

## 2013-05-05 LAB — POC URINE PREG, ED: Preg Test, Ur: NEGATIVE

## 2013-05-05 MED ORDER — LORAZEPAM 1 MG PO TABS
1.0000 mg | ORAL_TABLET | Freq: Once | ORAL | Status: AC
  Administered 2013-05-05: 1 mg via ORAL
  Filled 2013-05-05: qty 1

## 2013-05-05 NOTE — Discharge Instructions (Signed)
Joint Sprain A sprain is a tear or stretch in the ligaments that hold a joint together. Severe sprains may need as long as 3-6 weeks of immobilization and/or exercises to heal completely. Sprained joints should be rested and protected. If not, they can become unstable and prone to re-injury. Proper treatment can reduce your pain, shorten the period of disability, and reduce the risk of repeated injuries. TREATMENT   Rest and elevate the injured joint to reduce pain and swelling.  Apply ice packs to the injury for 20-30 minutes every 2-3 hours for the next 2-3 days.  Keep the injury wrapped in a compression bandage or splint as long as the joint is painful or as instructed by your caregiver.  Do not use the injured joint until it is completely healed to prevent re-injury and chronic instability. Follow the instructions of your caregiver.  Long-term sprain management may require exercises and/or treatment by a physical therapist. Taping or special braces may help stabilize the joint until it is completely better. SEEK MEDICAL CARE IF:   You develop increased pain or swelling of the joint.  You develop increasing redness and warmth of the joint.  You develop a fever.  It becomes stiff.  Your hand or foot gets cold or numb. Document Released: 02/22/2004 Document Revised: 04/08/2011 Document Reviewed: 02/01/2008 [facility] Patient Information 2014 [place], [place]. Please try to establish medical care  You have been give a resource list to assist you in this.

## 2013-05-05 NOTE — ED Notes (Signed)
Pt reports multiple seizures x 2 weeks. States that she isn't able to afford seizure medications. Reports that she use to be on Dilantin, but "I was taken off of it because it was affecting my gums. They put me on something last month when I lived at the homeless shelter, but I don't know the name. I can't read."  Neuro intact. No tongue injury noted. PERRLA, 14mm.

## 2013-05-05 NOTE — ED Notes (Signed)
Pt states HA, Fall with left wrist pain.

## 2013-05-05 NOTE — ED Notes (Signed)
Contacted lab about urine; urine currently in process. Pt resting in bed.
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
## 2013-05-06 ENCOUNTER — Emergency Department (HOSPITAL_COMMUNITY)
Admission: EM | Admit: 2013-05-06 | Discharge: 2013-05-07 | Disposition: A | Discharge status: Home / Self Care | Payer: Medicaid - Out of State | Attending: Emergency Medicine | Admitting: Emergency Medicine

## 2013-05-06 ENCOUNTER — Encounter (HOSPITAL_COMMUNITY): Payer: Self-pay | Admitting: Emergency Medicine

## 2013-05-06 DIAGNOSIS — S0993XA Unspecified injury of face, initial encounter: Secondary | ICD-10-CM | POA: Insufficient documentation

## 2013-05-06 DIAGNOSIS — S060X9A Concussion with loss of consciousness of unspecified duration, initial encounter: Secondary | ICD-10-CM | POA: Insufficient documentation

## 2013-05-06 DIAGNOSIS — M545 Low back pain, unspecified: Secondary | ICD-10-CM

## 2013-05-06 DIAGNOSIS — I10 Essential (primary) hypertension: Secondary | ICD-10-CM | POA: Insufficient documentation

## 2013-05-06 DIAGNOSIS — IMO0002 Reserved for concepts with insufficient information to code with codable children: Secondary | ICD-10-CM | POA: Insufficient documentation

## 2013-05-06 DIAGNOSIS — Z791 Long term (current) use of non-steroidal anti-inflammatories (NSAID): Secondary | ICD-10-CM | POA: Insufficient documentation

## 2013-05-06 DIAGNOSIS — S199XXA Unspecified injury of neck, initial encounter: Principal | ICD-10-CM

## 2013-05-06 DIAGNOSIS — E119 Type 2 diabetes mellitus without complications: Secondary | ICD-10-CM | POA: Insufficient documentation

## 2013-05-06 DIAGNOSIS — M542 Cervicalgia: Secondary | ICD-10-CM

## 2013-05-06 DIAGNOSIS — Z8669 Personal history of other diseases of the nervous system and sense organs: Secondary | ICD-10-CM | POA: Insufficient documentation

## 2013-05-06 DIAGNOSIS — Z8659 Personal history of other mental and behavioral disorders: Secondary | ICD-10-CM | POA: Insufficient documentation

## 2013-05-06 MED ORDER — OXYCODONE-ACETAMINOPHEN 5-325 MG PO TABS
1.0000 | ORAL_TABLET | Freq: Once | ORAL | Status: AC
Start: 2013-05-06 — End: 2013-05-06
  Administered 2013-05-06: 1 via ORAL
  Filled 2013-05-06: qty 1

## 2013-05-06 MED ORDER — OXYCODONE-ACETAMINOPHEN 5-325 MG PO TABS
1.0000 | ORAL_TABLET | Freq: Once | ORAL | Status: AC
  Administered 2013-05-06: 1 via ORAL
  Filled 2013-05-06: qty 1

## 2013-05-06 MED ORDER — ONDANSETRON 4 MG PO TBDP
8.0000 mg | ORAL_TABLET | Freq: Once | ORAL | Status: AC
  Administered 2013-05-06: 8 mg via ORAL
  Filled 2013-05-06: qty 2
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
## 2013-05-06 NOTE — ED Provider Notes (Signed)
Medical screening examination/treatment/procedure(s) were performed by non-physician practitioner and as supervising physician I was immediately available for consultation/collaboration.   EKG Interpretation None        Sharyon Cable, MD 05/06/13 2024

## 2013-05-07 ENCOUNTER — Emergency Department (HOSPITAL_COMMUNITY): Payer: Medicaid - Out of State

## 2013-05-07 MED ORDER — MORPHINE SULFATE 4 MG/ML IJ SOLN
4.0000 mg | Freq: Once | INTRAMUSCULAR | Status: AC
  Administered 2013-05-07: 4 mg via INTRAMUSCULAR
  Filled 2013-05-07: qty 1

## 2013-05-07 MED ORDER — OXYCODONE-ACETAMINOPHEN 7.5-325 MG PO TABS: 1.0000 | ORAL_TABLET | ORAL | Status: DC | PRN

## 2013-05-07 NOTE — Discharge Instructions (Signed)
Contusion A contusion is a deep bruise. Contusions are the result of an injury that caused bleeding under the skin. The contusion may turn blue, purple, or yellow. Minor injuries will give you a painless contusion, but more severe contusions may stay painful and swollen for a few weeks.  CAUSES  A contusion is usually caused by a blow, trauma, or direct force to an area of the body. SYMPTOMS   Swelling and redness of the injured area.  Bruising of the injured area.  Tenderness and soreness of the injured area.  Pain. DIAGNOSIS  The diagnosis can be made by taking a history and physical exam. An X-ray, CT scan, or MRI may be needed to determine if there were any associated injuries, such as fractures. TREATMENT  Specific treatment will depend on what area of the body was injured. In general, the best treatment for a contusion is resting, icing, elevating, and applying cold compresses to the injured area. Over-the-counter medicines may also be recommended for pain control. Ask your caregiver what the best treatment is for your contusion. HOME CARE INSTRUCTIONS   Put ice on the injured area.  Put ice in a plastic bag.  Place a towel between your skin and the bag.  Leave the ice on for 15-20 minutes, 03-04 times a day.  Only take over-the-counter or prescription medicines for pain, discomfort, or fever as directed by your caregiver. Your caregiver may recommend avoiding anti-inflammatory medicines (aspirin, ibuprofen, and naproxen) for 48 hours because these medicines may increase bruising.  Rest the injured area.  If possible, elevate the injured area to reduce swelling. SEEK IMMEDIATE MEDICAL CARE IF:   You have increased bruising or swelling.  You have pain that is getting worse.  Your swelling or pain is not relieved with medicines. MAKE SURE YOU:   Understand these instructions.  Will watch your condition.  Will get help right away if you are not doing well or get  worse. Document Released: 10/24/2004 Document Revised: 04/08/2011 Document Reviewed: 11/19/2010 [facility] Patient Information 2014 [place], [place].  Acetaminophen; Oxycodone tablets What is this medicine? ACETAMINOPHEN; OXYCODONE (a set a MEE noe fen; ox i KOE done) is a pain reliever. It is used to treat mild to moderate pain. This medicine may be used for other purposes; ask your health care provider or pharmacist if you have questions. COMMON BRAND NAME(S): Endocet, Magnacet, Narvox, Percocet, Perloxx, Primalev, Primlev, Roxicet, Xolox What should I tell my health care provider before I take this medicine? They need to know if you have any of these conditions: -brain tumor -Crohn's disease, inflammatory bowel disease, or ulcerative colitis -drug abuse or addiction -head injury -heart or circulation problems -if you often drink alcohol -kidney disease or problems going to the bathroom -liver disease -lung disease, asthma, or breathing problems -an unusual or allergic reaction to acetaminophen, oxycodone, other opioid analgesics, other medicines, foods, dyes, or preservatives -pregnant or trying to get pregnant -breast-feeding How should I use this medicine? Take this medicine by mouth with a full glass of water. Follow the directions on the prescription label. Take your medicine at regular intervals. Do not take your medicine more often than directed. Talk to your pediatrician regarding the use of this medicine in children. Special care may be needed. Patients over 61 years old may have a stronger reaction and need a smaller dose. Overdosage: If you think you have taken too much of this medicine contact a poison control center or emergency room at once. NOTE: This medicine  is only for you. Do not share this medicine with others. What if I miss a dose? If you miss a dose, take it as soon as you can. If it is almost time for your next dose, take only that dose. Do not take double or  extra doses. What may interact with this medicine? -alcohol -antihistamines -barbiturates like amobarbital, butalbital, butabarbital, methohexital, pentobarbital, phenobarbital, thiopental, and secobarbital -benztropine -drugs for bladder problems like solifenacin, trospium, oxybutynin, tolterodine, hyoscyamine, and methscopolamine -drugs for breathing problems like ipratropium and tiotropium -drugs for certain stomach or intestine problems like propantheline, homatropine methylbromide, glycopyrrolate, atropine, belladonna, and dicyclomine -general anesthetics like etomidate, ketamine, nitrous oxide, propofol, desflurane, enflurane, halothane, isoflurane, and sevoflurane -medicines for depression, anxiety, or psychotic disturbances -medicines for sleep -muscle relaxants -naltrexone -narcotic medicines (opiates) for pain -phenothiazines like perphenazine, thioridazine, chlorpromazine, mesoridazine, fluphenazine, prochlorperazine, promazine, and trifluoperazine -scopolamine -tramadol -trihexyphenidyl This list may not describe all possible interactions. Give your health care provider a list of all the medicines, herbs, non-prescription drugs, or dietary supplements you use. Also tell them if you smoke, drink alcohol, or use illegal drugs. Some items may interact with your medicine. What should I watch for while using this medicine? Tell your doctor or health care professional if your pain does not go away, if it gets worse, or if you have new or a different type of pain. You may develop tolerance to the medicine. Tolerance means that you will need a higher dose of the medication for pain relief. Tolerance is normal and is expected if you take this medicine for a long time. Do not suddenly stop taking your medicine because you may develop a severe reaction. Your body becomes used to the medicine. This does NOT mean you are addicted. Addiction is a behavior related to getting and using a drug for a  non-medical reason. If you have pain, you have a medical reason to take pain medicine. Your doctor will tell you how much medicine to take. If your doctor wants you to stop the medicine, the dose will be slowly lowered over time to avoid any side effects. You may get drowsy or dizzy. Do not drive, use machinery, or do anything that needs mental alertness until you know how this medicine affects you. Do not stand or sit up quickly, especially if you are an older patient. This reduces the risk of dizzy or fainting spells. Alcohol may interfere with the effect of this medicine. Avoid alcoholic drinks. There are different types of narcotic medicines (opiates) for pain. If you take more than one type at the same time, you may have more side effects. Give your health care provider a list of all medicines you use. Your doctor will tell you how much medicine to take. Do not take more medicine than directed. Call emergency for help if you have problems breathing. The medicine will cause constipation. Try to have a bowel movement at least every 2 to 3 days. If you do not have a bowel movement for 3 days, call your doctor or health care professional. Do not take Tylenol (acetaminophen) or medicines that have acetaminophen with this medicine. Too much acetaminophen can be very dangerous. Many nonprescription medicines contain acetaminophen. Always read the labels carefully to avoid taking more acetaminophen. What side effects may I notice from receiving this medicine? Side effects that you should report to your doctor or health care professional as soon as possible: -allergic reactions like skin rash, itching or hives, swelling of the face, lips, or  tongue -breathing difficulties, wheezing -confusion -light headedness or fainting spells -severe stomach pain -unusually weak or tired -yellowing of the skin or the whites of the eyes  Side effects that usually do not require medical attention (report to your doctor or  health care professional if they continue or are bothersome): -dizziness -drowsiness -nausea -vomiting This list may not describe all possible side effects. Call your doctor for medical advice about side effects. You may report side effects to FDA at 1-800-FDA-1088. Where should I keep my medicine? Keep out of the reach of children. This medicine can be abused. Keep your medicine in a safe place to protect it from theft. Do not share this medicine with anyone. Selling or giving away this medicine is dangerous and against the law. Store at room temperature between 20 and 25 degrees C (68 and 77 degrees F). Keep container tightly closed. Protect from light. This medicine may cause accidental overdose and death if it is taken by other adults, children, or pets. Flush any unused medicine down the toilet to reduce the chance of harm. Do not use the medicine after the expiration date. NOTE: This sheet is a summary. It may not cover all possible information. If you have questions about this medicine, talk to your doctor, pharmacist, or health care provider.  2014, [employer]. (2012-09-07 13:17:35)

## 2013-05-18 ENCOUNTER — Encounter: Payer: Self-pay | Admitting: Obstetrics & Gynecology

## 2013-05-21 ENCOUNTER — Ambulatory Visit: Payer: Self-pay | Admitting: Internal Medicine

## 2013-05-25 ENCOUNTER — Emergency Department (HOSPITAL_COMMUNITY)
Admission: EM | Admit: 2013-05-25 | Discharge: 2013-05-25 | Disposition: A | Discharge status: Home / Self Care | Payer: No Typology Code available for payment source | Attending: Emergency Medicine | Admitting: Emergency Medicine

## 2013-05-25 ENCOUNTER — Encounter (HOSPITAL_COMMUNITY): Payer: Self-pay | Admitting: Emergency Medicine

## 2013-05-25 ENCOUNTER — Emergency Department (HOSPITAL_COMMUNITY): Payer: No Typology Code available for payment source

## 2013-05-25 DIAGNOSIS — I10 Essential (primary) hypertension: Secondary | ICD-10-CM | POA: Insufficient documentation

## 2013-05-25 DIAGNOSIS — M79672 Pain in left foot: Secondary | ICD-10-CM

## 2013-05-25 DIAGNOSIS — Z8659 Personal history of other mental and behavioral disorders: Secondary | ICD-10-CM | POA: Diagnosis not present

## 2013-05-25 DIAGNOSIS — Y9289 Other specified places as the place of occurrence of the external cause: Secondary | ICD-10-CM | POA: Diagnosis not present

## 2013-05-25 DIAGNOSIS — S8990XA Unspecified injury of unspecified lower leg, initial encounter: Secondary | ICD-10-CM | POA: Insufficient documentation

## 2013-05-25 DIAGNOSIS — E119 Type 2 diabetes mellitus without complications: Secondary | ICD-10-CM | POA: Diagnosis not present

## 2013-05-25 DIAGNOSIS — Z8669 Personal history of other diseases of the nervous system and sense organs: Secondary | ICD-10-CM | POA: Insufficient documentation

## 2013-05-25 DIAGNOSIS — S99919A Unspecified injury of unspecified ankle, initial encounter: Principal | ICD-10-CM

## 2013-05-25 DIAGNOSIS — M25572 Pain in left ankle and joints of left foot: Secondary | ICD-10-CM

## 2013-05-25 DIAGNOSIS — Z79899 Other long term (current) drug therapy: Secondary | ICD-10-CM | POA: Diagnosis not present

## 2013-05-25 DIAGNOSIS — J45909 Unspecified asthma, uncomplicated: Secondary | ICD-10-CM | POA: Insufficient documentation

## 2013-05-25 DIAGNOSIS — Y9389 Activity, other specified: Secondary | ICD-10-CM | POA: Insufficient documentation

## 2013-05-25 DIAGNOSIS — R269 Unspecified abnormalities of gait and mobility: Secondary | ICD-10-CM | POA: Insufficient documentation

## 2013-05-25 DIAGNOSIS — Z791 Long term (current) use of non-steroidal anti-inflammatories (NSAID): Secondary | ICD-10-CM | POA: Diagnosis not present

## 2013-05-25 DIAGNOSIS — X500XXA Overexertion from strenuous movement or load, initial encounter: Secondary | ICD-10-CM | POA: Insufficient documentation

## 2013-05-25 DIAGNOSIS — S99929A Unspecified injury of unspecified foot, initial encounter: Secondary | ICD-10-CM | POA: Diagnosis present

## 2013-05-25 HISTORY — DX: Unspecified asthma, uncomplicated: J45.909

## 2013-05-25 HISTORY — DX: Obesity, unspecified: E66.9

## 2013-05-25 MED ORDER — OXYCODONE-ACETAMINOPHEN 5-325 MG PO TABS
2.0000 | ORAL_TABLET | Freq: Once | ORAL | Status: AC
  Administered 2013-05-25: 2 via ORAL
  Filled 2013-05-25: qty 2

## 2013-05-25 MED ORDER — OXYCODONE-ACETAMINOPHEN 5-325 MG PO TABS: 1.0000 | ORAL_TABLET | Freq: Four times a day (QID) | ORAL | Status: DC | PRN

## 2013-05-25 MED ORDER — ONDANSETRON 4 MG PO TBDP
8.0000 mg | ORAL_TABLET | Freq: Once | ORAL | Status: AC
  Administered 2013-05-25: 8 mg via ORAL
  Filled 2013-05-25: qty 2

## 2013-05-25 NOTE — ED Notes (Signed)
Paged Ortho Tech for ASO ankle brace.

## 2013-05-25 NOTE — Progress Notes (Signed)
Orthopedic Tech Progress Note Patient Details:  [name] [name] 06/22/1975 [id number]  Ortho Devices Type of Ortho Device: ASO;Crutches Ortho Device/Splint Location: LLE Ortho Device/Splint Interventions: Ordered;Application   Braulio Bosch 05/25/2013, 10:49 PM
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
## 2013-05-25 NOTE — Discharge Instructions (Signed)
Ankle Pain Ankle pain is a common symptom. The bones, cartilage, tendons, and muscles of the ankle joint perform a lot of work each day. The ankle joint holds your body weight and allows you to move around. Ankle pain can occur on either side or back of 1 or both ankles. Ankle pain may be sharp and burning or dull and aching. There may be tenderness, stiffness, redness, or warmth around the ankle. The pain occurs more often when a person walks or puts pressure on the ankle. CAUSES  There are many reasons ankle pain can develop. It is important to work with your caregiver to identify the cause since many conditions can impact the bones, cartilage, muscles, and tendons. Causes for ankle pain include:  Injury, including a break (fracture), sprain, or strain often due to a fall, sports, or a high-impact activity.  Swelling (inflammation) of a tendon (tendonitis).  Achilles tendon rupture.  Ankle instability after repeated sprains and strains.  Poor foot alignment.  Pressure on a nerve (tarsal tunnel syndrome).  Arthritis in the ankle or the lining of the ankle.  Crystal formation in the ankle (gout or pseudogout). DIAGNOSIS  A diagnosis is based on your medical history, your symptoms, results of your physical exam, and results of diagnostic tests. Diagnostic tests may include X-ray exams or a computerized magnetic scan (magnetic resonance imaging, MRI). TREATMENT  Treatment will depend on the cause of your ankle pain and may include:  Keeping pressure off the ankle and limiting activities.  Using crutches or other walking support (a cane or brace).  Using rest, ice, compression, and elevation.  Participating in physical therapy or home exercises.  Wearing shoe inserts or special shoes.  Losing weight.  Taking medications to reduce pain or swelling or receiving an injection.  Undergoing surgery. HOME CARE INSTRUCTIONS   Only take over-the-counter or prescription medicines for  pain, discomfort, or fever as directed by your caregiver.  Put ice on the injured area.  Put ice in a plastic bag.  Place a towel between your skin and the bag.  Leave the ice on for 15-20 minutes at a time, 03-04 times a day.  Keep your leg raised (elevated) when possible to lessen swelling.  Avoid activities that cause ankle pain.  Follow specific exercises as directed by your caregiver.  Record how often you have ankle pain, the location of the pain, and what it feels like. This information may be helpful to you and your caregiver.  Ask your caregiver about returning to work or sports and whether you should drive.  Follow up with your caregiver for further examination, therapy, or testing as directed. SEEK MEDICAL CARE IF:   Pain or swelling continues or worsens beyond 1 week.  You have an oral temperature above 102 F (38.9 C).  You are feeling unwell or have chills.  You are having an increasingly difficult time with walking.  You have loss of sensation or other new symptoms.  You have questions or concerns. MAKE SURE YOU:   Understand these instructions.  Will watch your condition.  Will get help right away if you are not doing well or get worse. Document Released: 07/04/2009 Document Revised: 04/08/2011 Document Reviewed: 07/04/2009 [facility] Patient Information 2014 [place].  Musculoskeletal Pain Musculoskeletal pain is muscle and boney aches and pains. These pains can occur in any part of the body. Your caregiver may treat you without knowing the cause of the pain. They may treat you if blood or urine tests,  X-rays, and other tests were normal.  CAUSES There is often not a definite cause or reason for these pains. These pains may be caused by a type of germ (virus). The discomfort may also come from overuse. Overuse includes working out too hard when your body is not fit. Boney aches also come from weather changes. Bone is sensitive to atmospheric  pressure changes. HOME CARE INSTRUCTIONS   Ask when your test results will be ready. Make sure you get your test results.  Only take over-the-counter or prescription medicines for pain, discomfort, or fever as directed by your caregiver. If you were given medications for your condition, do not drive, operate machinery or power tools, or sign legal documents for 24 hours. Do not drink alcohol. Do not take sleeping pills or other medications that may interfere with treatment.  Continue all activities unless the activities cause more pain. When the pain lessens, slowly resume normal activities. Gradually increase the intensity and duration of the activities or exercise.  During periods of severe pain, bed rest may be helpful. Lay or sit in any position that is comfortable.  Putting ice on the injured area.  Put ice in a bag.  Place a towel between your skin and the bag.  Leave the ice on for 15 to 20 minutes, 3 to 4 times a day.  Follow up with your caregiver for continued problems and no reason can be found for the pain. If the pain becomes worse or does not go away, it may be necessary to repeat tests or do additional testing. Your caregiver may need to look further for a possible cause. SEEK IMMEDIATE MEDICAL CARE IF:  You have pain that is getting worse and is not relieved by medications.  You develop chest pain that is associated with shortness or breath, sweating, feeling sick to your stomach (nauseous), or throw up (vomit).  Your pain becomes localized to the abdomen.  You develop any new symptoms that seem different or that concern you. MAKE SURE YOU:   Understand these instructions.  Will watch your condition.  Will get help right away if you are not doing well or get worse. Document Released: 01/14/2005 Document Revised: 04/08/2011 Document Reviewed: 09/18/2012 [facility] Patient Information 2014 [place].

## 2013-05-25 NOTE — ED Provider Notes (Signed)
CSN: [id number]     Arrival date & time 05/25/13  1910 History  This chart was scribed for non-physician practitioner Cleatrice Burke, PA-C working with Leota Jacobsen, MD by Eston Mould, ED Scribe. This patient was seen in room TR10C/TR10C and the patient's care was started at 9:06 PM .   Chief Complaint  Patient presents with  . Ankle Pain   The history is provided by the patient. No language interpreter was used.   HPI Comments: [name] [name] is a 38 y.o. female who presents to the Emergency Department complaining of L ankle pain due to a fall that occurred today. Pt states she was getting out of her car, got tangled in her seat belt, and states she twisted her L ankle. Reports pain worsening when weight bearing. She reports taking OTC Ibuprofen and Tylenol 3 without relief. She denies LOC, hitting head, and any other injuries.  Past Medical History  Diagnosis Date  . Bipolar 1 disorder   . PTSD (post-traumatic stress disorder)   . Schizophrenia   . Depression   . Seizures   . Hypertension   . Diabetes mellitus without complication   . Asthma   . Obesity    Past Surgical History  Procedure Laterality Date  . Cholecystectomy    . Cesarean section     No family history on file. History  Substance Use Topics  . Smoking status: Never Smoker   . Smokeless tobacco: Not on file  . Alcohol Use: Yes   OB History   Grav Para Term Preterm Abortions TAB SAB Ect Mult Living                 Review of Systems  Respiratory: Negative for shortness of breath.   Cardiovascular: Negative for chest pain.  Musculoskeletal: Positive for arthralgias, gait problem and myalgias.  Skin: Negative for color change and wound.  Neurological: Negative for syncope, weakness and headaches.  All other systems reviewed and are negative.  Allergies  Aspirin; Bee venom; and Sulfa antibiotics  Home Medications   Prior to Admission medications   Medication Sig Start Date End Date  Taking? Authorizing Provider  albuterol (PROVENTIL HFA;VENTOLIN HFA) 108 (90 BASE) MCG/ACT inhaler Inhale 2 puffs into the lungs every 6 (six) hours as needed for wheezing or shortness of breath (asthma).    Yes Historical Provider, MD  cyclobenzaprine (FLEXERIL) 10 MG tablet Take 1 tablet (10 mg total) by mouth 2 (two) times daily as needed for muscle spasms (or pain). 04/30/13  Yes Clayton Bibles, PA-C  ibuprofen (ADVIL,MOTRIN) 800 MG tablet Take 1 tablet (800 mg total) by mouth every 8 (eight) hours as needed for mild pain or moderate pain. 04/30/13  Yes Clayton Bibles, PA-C  naproxen sodium (ANAPROX) 220 MG tablet Take 880 mg by mouth 2 (two) times daily with a meal.    Yes Historical Provider, MD  oxyCODONE-acetaminophen (PERCOCET) 7.5-325 MG per tablet Take 1 tablet by mouth every 4 (four) hours as needed for pain. 7/82/95  Yes Delora Fuel, MD   BP 621/30  Pulse 74  Temp(Src) 98 F (36.7 C) (Oral)  Resp 22  SpO2 100%  Physical Exam  Nursing note and vitals reviewed. Constitutional: She is oriented to person, place, and time. She appears well-developed and well-nourished. No distress.  Morbidly obese.  HENT:  Head: Normocephalic and atraumatic.  Right Ear: External ear normal.  Left Ear: External ear normal.  Nose: Nose normal.  Mouth/Throat: Oropharynx is clear and moist.  Eyes:  Conjunctivae and EOM are normal. Pupils are equal, round, and reactive to light.  Neck: Normal range of motion.  Cardiovascular: Normal rate, regular rhythm, normal heart sounds, intact distal pulses and normal pulses.   Pulses:      Dorsalis pedis pulses are 2+ on the left side.       Posterior tibial pulses are 2+ on the left side.  Capillary refill < 3 seconds in all toes.  Pulmonary/Chest: Effort normal and breath sounds normal. No stridor. No respiratory distress. She has no wheezes. She has no rales.  Abdominal: Soft. She exhibits no distension.  Musculoskeletal: Normal range of motion.  Exquisitely tender  to palpation diffusely over L ankle and foot. Difficult to evaluate swelling given patient's large body habitus. Compartment soft.  Neurological: She is alert and oriented to person, place, and time. She has normal strength. Gait (antalgic) abnormal.  Neurovascularly intact  Skin: Skin is warm and dry. She is not diaphoretic. No erythema.  Psychiatric: She has a normal mood and affect. Her behavior is normal.    ED Course  Procedures DIAGNOSTIC STUDIES: Oxygen Saturation is 100% on RA, normal by my interpretation.    COORDINATION OF CARE: 9:10 PM-Discussed treatment plan which includes L foot X-ray and administer Percocet while in the ED. Pt agreed to plan.   10:33 PM-Informed pt of L foot X-ray that is WNL. Will discharge pt with a brace for L foot. Advised pt to ice, elevate, use crutches and Percocet PRN. Pt agreed to plan.  Labs Review Labs Reviewed - No data to display  Imaging Review Dg Ankle Complete Left  05/25/2013   CLINICAL DATA:  Pain post trauma  EXAM: LEFT ANKLE COMPLETE - 3+ VIEW  COMPARISON:  March 01, 2013  FINDINGS: Frontal, oblique, and lateral views were obtained. There is no acute fracture or joint effusion. Ankle mortise appears intact. A well corticated focal area of calcification in the lateral malleolar region is stable and felt to be residua of old trauma. There is a spur arising from the inferior calcaneus.  IMPRESSION: Probable old trauma lateral malleolar region, stable. No acute fracture. Mortise intact. Inferior calcaneal spur present.   Electronically Signed   By: Lowella Grip M.D.   On: 05/25/2013 20:52   Dg Foot Complete Left  05/25/2013   CLINICAL DATA:  Left ankle and foot pain status post twisting injury.  EXAM: LEFT FOOT - COMPLETE 3+ VIEW  COMPARISON:  DG ANKLE COMPLETE*L* dated 05/25/2013  FINDINGS: Three views of the left foot reveal the bones to be adequately mineralized. There is no evidence of an acute fracture nor dislocation. There is a  plantar calcaneal spur. There is soft tissue swelling over the midfoot.  IMPRESSION: There is no acute bony abnormality of the left foot. There is soft tissue swelling.   Electronically Signed   By: David  Martinique   On: 05/25/2013 22:22     EKG Interpretation None      MDM   Final diagnoses:  Left ankle pain  Left foot pain    Patient presents to ED for evaluation of left ankle and foot pain after twisting her foot today. XR shows no acute bony abnormality. No other injuries sustained from the fall. Patient will be given ASO brace and crutches for comfort. Discussed with patient the importance of RICE and NSAIDs. Return instructions given. Vital signs stable for discharge. Patient / Family / Caregiver informed of clinical course, understand medical decision-making process, and agree with plan.  I personally performed the services described in this documentation, which was scribed in my presence. The recorded information has been reviewed and is accurate.    Elwyn Lade, PA-C 05/26/13 1715

## 2013-05-26 NOTE — ED Provider Notes (Signed)
Medical screening examination/treatment/procedure(s) were performed by non-physician practitioner and as supervising physician I was immediately available for consultation/collaboration.  Leota Jacobsen, MD 05/26/13 [phone]

## 2013-06-07 ENCOUNTER — Emergency Department (HOSPITAL_COMMUNITY): Payer: No Typology Code available for payment source

## 2013-06-07 ENCOUNTER — Emergency Department (HOSPITAL_COMMUNITY)
Admission: EM | Admit: 2013-06-07 | Discharge: 2013-06-07 | Disposition: A | Discharge status: Home / Self Care | Payer: No Typology Code available for payment source | Attending: Emergency Medicine | Admitting: Emergency Medicine

## 2013-06-07 DIAGNOSIS — Z8669 Personal history of other diseases of the nervous system and sense organs: Secondary | ICD-10-CM | POA: Insufficient documentation

## 2013-06-07 DIAGNOSIS — Z79899 Other long term (current) drug therapy: Secondary | ICD-10-CM | POA: Insufficient documentation

## 2013-06-07 DIAGNOSIS — Z791 Long term (current) use of non-steroidal anti-inflammatories (NSAID): Secondary | ICD-10-CM | POA: Insufficient documentation

## 2013-06-07 DIAGNOSIS — Y9289 Other specified places as the place of occurrence of the external cause: Secondary | ICD-10-CM | POA: Insufficient documentation

## 2013-06-07 DIAGNOSIS — J45909 Unspecified asthma, uncomplicated: Secondary | ICD-10-CM | POA: Insufficient documentation

## 2013-06-07 DIAGNOSIS — E119 Type 2 diabetes mellitus without complications: Secondary | ICD-10-CM | POA: Insufficient documentation

## 2013-06-07 DIAGNOSIS — S93409A Sprain of unspecified ligament of unspecified ankle, initial encounter: Secondary | ICD-10-CM | POA: Insufficient documentation

## 2013-06-07 DIAGNOSIS — W010XXA Fall on same level from slipping, tripping and stumbling without subsequent striking against object, initial encounter: Secondary | ICD-10-CM | POA: Insufficient documentation

## 2013-06-07 DIAGNOSIS — Z8659 Personal history of other mental and behavioral disorders: Secondary | ICD-10-CM | POA: Insufficient documentation

## 2013-06-07 DIAGNOSIS — E669 Obesity, unspecified: Secondary | ICD-10-CM | POA: Insufficient documentation

## 2013-06-07 DIAGNOSIS — Y9301 Activity, walking, marching and hiking: Secondary | ICD-10-CM | POA: Insufficient documentation

## 2013-06-07 DIAGNOSIS — I10 Essential (primary) hypertension: Secondary | ICD-10-CM | POA: Insufficient documentation

## 2013-06-07 MED ORDER — HYDROCODONE-ACETAMINOPHEN 5-325 MG PO TABS
2.0000 | ORAL_TABLET | Freq: Once | ORAL | Status: AC
  Administered 2013-06-07: 2 via ORAL
  Filled 2013-06-07: qty 2

## 2013-06-07 MED ORDER — TRAMADOL HCL 50 MG PO TABS: 50.0000 mg | ORAL_TABLET | Freq: Four times a day (QID) | ORAL | Status: DC | PRN

## 2013-06-07 MED ORDER — HYDROCODONE-ACETAMINOPHEN 5-325 MG PO TABS: 2.0000 | ORAL_TABLET | Freq: Four times a day (QID) | ORAL | Status: DC | PRN

## 2013-06-07 NOTE — ED Notes (Signed)
Ortho tech called for application of ASO ankle splints x 2. Pt does not need crutches.

## 2013-06-07 NOTE — ED Provider Notes (Signed)
CSN: [id number]     Arrival date & time 06/07/13  1541 History  This chart was scribed for non-physician practitioner, Hyman Bible, PA-C working with Jasper Riling. Alvino Chapel, MD by Frederich Balding, ED scribe. This patient was seen in room WTR5/WTR5 and the patient's care was started at 4:20 PM.    Chief Complaint  Patient presents with  . Ankle Injury   The history is provided by the patient. No language interpreter was used.   HPI Comments: [name] [name] is a 38 y.o. female who presents to the Emergency Department complaining of bilateral ankle injury that occurred about 2 hours ago. Pt was walking in the parking lot, tripped over a child's toy, fell and twisted both of her ankles. She has sudden onset bilateral ankle pain with associated swelling. Pt states she also has some numbness and tingling in her toes. Bearing weight and certain movements worsen the pain. Pt has had several ankle injuries in the past.   Past Medical History  Diagnosis Date  . Bipolar 1 disorder   . PTSD (post-traumatic stress disorder)   . Schizophrenia   . Depression   . Seizures   . Hypertension   . Diabetes mellitus without complication   . Asthma   . Obesity    Past Surgical History  Procedure Laterality Date  . Cholecystectomy    . Cesarean section     No family history on file. History  Substance Use Topics  . Smoking status: Never Smoker   . Smokeless tobacco: Not on file  . Alcohol Use: Yes   OB History   Grav Para Term Preterm Abortions TAB SAB Ect Mult Living                 Review of Systems  Musculoskeletal: Positive for arthralgias and joint swelling.  Neurological: Positive for numbness.  All other systems reviewed and are negative.  Allergies  Aspirin; Bee venom; and Sulfa antibiotics  Home Medications   Prior to Admission medications   Medication Sig Start Date End Date Taking? Authorizing Provider  albuterol (PROVENTIL HFA;VENTOLIN HFA) 108 (90 BASE) MCG/ACT inhaler  Inhale 2 puffs into the lungs every 6 (six) hours as needed for wheezing or shortness of breath (asthma).     Historical Provider, MD  cyclobenzaprine (FLEXERIL) 10 MG tablet Take 1 tablet (10 mg total) by mouth 2 (two) times daily as needed for muscle spasms (or pain). 04/30/13   Clayton Bibles, PA-C  ibuprofen (ADVIL,MOTRIN) 800 MG tablet Take 1 tablet (800 mg total) by mouth every 8 (eight) hours as needed for mild pain or moderate pain. 04/30/13   Clayton Bibles, PA-C  naproxen sodium (ANAPROX) 220 MG tablet Take 880 mg by mouth 2 (two) times daily with a meal.     Historical Provider, MD  oxyCODONE-acetaminophen (PERCOCET) 7.5-325 MG per tablet Take 1 tablet by mouth every 4 (four) hours as needed for pain. 03/29/58   Delora Fuel, MD  oxyCODONE-acetaminophen (PERCOCET/ROXICET) 5-325 MG per tablet Take 1-2 tablets by mouth every 6 (six) hours as needed for severe pain. 05/25/13   Kara Mead Merrell, PA-C   BP 151/127  Pulse 68  Temp(Src) 97.7 F (36.5 C) (Oral)  Resp 20  Wt 310 lb (140.615 kg)  SpO2 98%  Physical Exam  Nursing note and vitals reviewed. Constitutional: She appears well-developed and well-nourished.  HENT:  Head: Normocephalic and atraumatic.  Mouth/Throat: Oropharynx is clear and moist.  Eyes: EOM are normal. Pupils are equal, round, and reactive to  light.  Neck: Normal range of motion. Neck supple.  Cardiovascular: Normal rate, regular rhythm and normal heart sounds.   Pulses:      Dorsalis pedis pulses are 2+ on the right side, and 2+ on the left side.  Pulmonary/Chest: Effort normal and breath sounds normal. She has no wheezes.  Musculoskeletal: Normal range of motion.  Tenderness to palpation over lateral malleolus on both right and left ankle. Tenderness to palpation of medial malleolus of left and right ankle. ROM of both ankles limited secondary to pain.   Neurological: She is alert.  Sensation of all toes intact bilaterally.  Skin: Skin is warm and dry.  Psychiatric: She  has a normal mood and affect. Her behavior is normal.    ED Course  Procedures (including critical care time)  DIAGNOSTIC STUDIES: Oxygen Saturation is 98% on RA, normal by my interpretation.    COORDINATION OF CARE: 4:26 PM-Discussed treatment plan which includes pain medication and xrays with pt at bedside and pt agreed to plan.   Labs Review Labs Reviewed - No data to display  Imaging Review Dg Ankle Complete Left  06/07/2013   CLINICAL DATA:  Left ankle pain status post trauma  EXAM: LEFT ANKLE COMPLETE - 3+ VIEW  COMPARISON:  Left ankle series of May 25, 2013  FINDINGS: There is moderate diffuse soft tissue swelling. There is a tiny bony density inferior to the tip of the lateral malleolus unchanged from the previous study. The ankle joint mortise is reasonably well maintained. The medial and posterior malleoli are intact. The tailor dome is intact. There is a moderate-sized plantar calcaneal spur. The calcaneus itself appears intact. The other tarsal bones appear intact where visualized.  IMPRESSION: There is bony density inferior and slightly medial to the tip of the lateral malleolus which was demonstrated on the previous study and was felt to likely be the sequelae of a previous injury. No acute fracture of the left ankle is demonstrated. There is mild diffuse but stable appearing soft tissue swelling.   Electronically Signed   By: David  Martinique   On: 06/07/2013 16:30   Dg Ankle Complete Right  06/07/2013   CLINICAL DATA:  Ankle pain and swelling status post injury  EXAM: RIGHT ANKLE - COMPLETE 3+ VIEW  COMPARISON:  None.  FINDINGS: Three views of the right ankle reveal the joint mortise to be preserved. The tailor dome is intact. There is no acute fracture demonstrated. A tiny bony density adjacent to the tip of the medial malleolus is likely a chronic avulsion fracture fragment versus accessory ossicle. There are mild degenerative spurs noted from the medial and lateral malleoli.  There is a plantar calcaneal spur. The talus and calcaneus appear intact. There is mild is overlying soft tissue swelling.  IMPRESSION: There are mild degenerative changes of the right ankle and there is mild diffuse soft tissue swelling. There is no definite evidence of an acute fracture nor dislocation.   Electronically Signed   By: David  Martinique   On: 06/07/2013 16:32     EKG Interpretation None      MDM   Final diagnoses:  None   Patient presenting after twisting both ankles.  Xrays of both ankles negative.  Patient neurovascularly intact bilaterally.  Patient given ankle ASO's bilaterally.  Patient stable for discharge.  Return precautions given.  I personally performed the services described in this documentation, which was scribed in my presence. The recorded information has been reviewed and is accurate.  Nira Conn  Noel Rodier, PA-C 06/10/13 0830

## 2013-06-07 NOTE — Discharge Instructions (Signed)
Be sure to read and understand instructions below prior to leaving the hospital. If your symptoms persist without any improvement in 1 week it is recommended that you follow up with Primary Care Physician listed above. Use your pain medication as prescribed and do not operate heavy machinery while on pain medication.   Ankle Sprain  An ankle sprain is an injury to the ligaments that hold the ankle joint together. Your X-ray today showed no evidence of fracture, however keep all follow-up appointments with an orthopedic specialist to have follow-up X-rays, because as we discussed fractures may not appear until 3 days after the acute injury.    TREATMENT  Rest, ice, elevation, and compression are the basic modes of treatment.    HOME CARE INSTRUCTIONS  Apply ice to the sore area for 15 to 20 minutes, 3 to 4 times per day. Do this while you are awake for the first 2 days, or as directed. This can be stopped when the swelling goes away. Put the ice in a plastic bag and place a towel between the bag of ice and your skin.  Keep your leg elevated when possible to lessen swelling.  If your caregiver recommends crutches, use them as instructed for 1 week. Then, you may walk on your ankle weight bearing as tolerated.  You may take off your ankle stabilizer at night and to take a shower or bath. Wiggle your toes in the splint several times per day if you are able.  Do not drive a vehicle on pain medication. ACTIVITY:            - Weight bearing as tolerated            - Exercises should be limited to pain free range of motion            - Can start mobilization by tracing the alphabet with your foot in the air.       SEEK MEDICAL CARE IF:  You have an increase in bruising, swelling, or pain.  Your toes feel cold.  Pain relief is not achieved with medications.  EMERGENCY:: Your toes are numb or blue or you have severe pain.  MAKE SURE YOU:  Understand these instructions.  Will watch your condition.    Will get help right away if you are not doing well or get worse   COLD THERAPY DIRECTIONS:  Ice or gel packs can be used to reduce both pain and swelling. Ice is the most helpful within the first 24 to 48 hours after an injury or flareup from overusing a muscle or joint.  Ice is effective, has very few side effects, and is safe for most people to use.   If you expose your skin to cold temperatures for too long or without the proper protection, you can damage your skin or nerves. Watch for signs of skin damage due to cold.   HOME CARE INSTRUCTIONS  Follow these tips to use ice and cold packs safely.  Place a dry or damp towel between the ice and skin. A damp towel will cool the skin more quickly, so you may need to shorten the time that the ice is used.  For a more rapid response, add gentle compression to the ice.  Ice for no more than 10 to 20 minutes at a time. The bonier the area you are icing, the less time it will take to get the benefits of ice.  Check your skin after 5 minutes to make  sure there are no signs of a poor response to cold or skin damage.  Rest 20 minutes or more in between uses.  Once your skin is numb, you can end your treatment. You can test numbness by very lightly touching your skin. The touch should be so light that you do not see the skin dimple from the pressure of your fingertip. When using ice, most people will feel these normal sensations in this order: cold, burning, aching, and numbness.  Do not use ice on someone who cannot communicate their responses to pain, such as small children or people with dementia.   HOW TO MAKE AN ICE PACK  To make an ice pack, do one of the following:  Place crushed ice or a bag of frozen vegetables in a sealable plastic bag. Squeeze out the excess air. Place this bag inside another plastic bag. Slide the bag into a pillowcase or place a damp towel between your skin and the bag.  Mix 3 parts water with 1 part rubbing alcohol. Freeze  the mixture in a sealable plastic bag. When you remove the mixture from the freezer, it will be slushy. Squeeze out the excess air. Place this bag inside another plastic bag. Slide the bag into a pillowcase or place a damp towel between your sk

## 2013-06-07 NOTE — ED Notes (Signed)
Pt arrives in wheelchair to exam room.

## 2013-06-07 NOTE — ED Notes (Signed)
Pt states she has a ride home. 

## 2013-06-07 NOTE — ED Notes (Signed)
Pt states she was walking in the parking lot while shopping and tripped over a child's toy that had fallen bending both of her ankles in. Alert and oriented.

## 2013-06-11 NOTE — ED Provider Notes (Signed)
Medical screening examination/treatment/procedure(s) were performed by non-physician practitioner and as supervising physician I was immediately available for consultation/collaboration.   EKG Interpretation None       [name] [name]. Alvino Chapel, MD 06/11/13 2324

## 2013-06-16 ENCOUNTER — Encounter: Payer: Self-pay | Admitting: Obstetrics & Gynecology

## 2013-06-25 ENCOUNTER — Emergency Department (HOSPITAL_COMMUNITY)
Admission: EM | Admit: 2013-06-25 | Discharge: 2013-06-26 | Discharge status: Against Medical Advice | Payer: No Typology Code available for payment source | Attending: Emergency Medicine | Admitting: Emergency Medicine

## 2013-06-25 ENCOUNTER — Encounter (HOSPITAL_COMMUNITY): Payer: Self-pay | Admitting: Emergency Medicine

## 2013-06-25 DIAGNOSIS — J45909 Unspecified asthma, uncomplicated: Secondary | ICD-10-CM | POA: Insufficient documentation

## 2013-06-25 DIAGNOSIS — I10 Essential (primary) hypertension: Secondary | ICD-10-CM | POA: Insufficient documentation

## 2013-06-25 DIAGNOSIS — R509 Fever, unspecified: Secondary | ICD-10-CM | POA: Insufficient documentation

## 2013-06-25 DIAGNOSIS — R300 Dysuria: Secondary | ICD-10-CM | POA: Insufficient documentation

## 2013-06-25 DIAGNOSIS — E669 Obesity, unspecified: Secondary | ICD-10-CM | POA: Insufficient documentation

## 2013-06-25 DIAGNOSIS — R109 Unspecified abdominal pain: Secondary | ICD-10-CM | POA: Insufficient documentation

## 2013-06-25 DIAGNOSIS — N898 Other specified noninflammatory disorders of vagina: Secondary | ICD-10-CM | POA: Insufficient documentation

## 2013-06-25 DIAGNOSIS — E119 Type 2 diabetes mellitus without complications: Secondary | ICD-10-CM | POA: Insufficient documentation

## 2013-06-25 NOTE — ED Notes (Signed)
Called x 2 for reassessment with no answer  

## 2013-06-25 NOTE — ED Notes (Signed)
Pt states she is unable to void at this time. States "I think I will need a catheter."

## 2013-06-25 NOTE — ED Notes (Addendum)
Pt reports dysuria x 2 days. Pt reports bilateral flank pain with radiation to groin. Pt also reports white/yellow vaginal discharge with burning/iching x 4 days. Pt reports fever yesterday of 102.2, pt reports taking 1000mg  tylenol at 1200 today. NAD.

## 2013-06-25 NOTE — ED Notes (Signed)
Called x1 for reassessment with no answer

## 2013-06-25 NOTE — ED Notes (Signed)
Called x3 for reassessment, no response. Appears patient LWBS after triage.

## 2013-06-26 ENCOUNTER — Emergency Department (HOSPITAL_COMMUNITY)
Admission: EM | Admit: 2013-06-26 | Discharge: 2013-06-26 | Disposition: A | Discharge status: Home / Self Care | Payer: No Typology Code available for payment source

## 2013-06-26 ENCOUNTER — Encounter (HOSPITAL_COMMUNITY): Payer: Self-pay | Admitting: Emergency Medicine

## 2013-06-26 DIAGNOSIS — Z8659 Personal history of other mental and behavioral disorders: Secondary | ICD-10-CM | POA: Insufficient documentation

## 2013-06-26 DIAGNOSIS — Z8669 Personal history of other diseases of the nervous system and sense organs: Secondary | ICD-10-CM | POA: Insufficient documentation

## 2013-06-26 DIAGNOSIS — E119 Type 2 diabetes mellitus without complications: Secondary | ICD-10-CM | POA: Insufficient documentation

## 2013-06-26 DIAGNOSIS — E669 Obesity, unspecified: Secondary | ICD-10-CM | POA: Insufficient documentation

## 2013-06-26 DIAGNOSIS — Z3202 Encounter for pregnancy test, result negative: Secondary | ICD-10-CM | POA: Insufficient documentation

## 2013-06-26 DIAGNOSIS — I10 Essential (primary) hypertension: Secondary | ICD-10-CM | POA: Insufficient documentation

## 2013-06-26 DIAGNOSIS — J45909 Unspecified asthma, uncomplicated: Secondary | ICD-10-CM | POA: Insufficient documentation

## 2013-06-26 DIAGNOSIS — N390 Urinary tract infection, site not specified: Secondary | ICD-10-CM | POA: Insufficient documentation

## 2013-06-26 DIAGNOSIS — R112 Nausea with vomiting, unspecified: Secondary | ICD-10-CM | POA: Insufficient documentation

## 2013-06-26 LAB — BASIC METABOLIC PANEL
BUN: 13 mg/dL (ref 6–23)
CO2: 24 meq/L (ref 19–32)
Calcium: 8.8 mg/dL (ref 8.4–10.5)
Chloride: 108 mEq/L (ref 96–112)
Creatinine, Ser: 0.61 mg/dL (ref 0.50–1.10)
GFR calc Af Amer: >90 mL/min (ref >90)
GFR calc non Af Amer: >90 mL/min (ref >90)
GLUCOSE: 86 mg/dL (ref 70–99)
Potassium: 4.1 mEq/L (ref 3.7–5.3)
SODIUM: 139 meq/L (ref 137–147)

## 2013-06-26 LAB — CBC WITH DIFFERENTIAL/PLATELET
Basophils Absolute: 0 10*3/uL (ref 0.0–0.1)
Basophils Relative: 1 % (ref 0–1)
EOS PCT: 2 % (ref 0–5)
Eosinophils Absolute: 0.1 10*3/uL (ref 0.0–0.7)
HCT: 36 % (ref 36.0–46.0)
HEMOGLOBIN: 12.2 g/dL (ref 12.0–15.0)
LYMPHS ABS: 2.1 10*3/uL (ref 0.7–4.0)
Lymphocytes Relative: 36 % (ref 12–46)
MCH: 30.3 pg (ref 26.0–34.0)
MCHC: 33.9 g/dL (ref 30.0–36.0)
MCV: 89.6 fL (ref 78.0–100.0)
MONOS PCT: 5 % (ref 3–12)
Monocytes Absolute: 0.3 10*3/uL (ref 0.1–1.0)
Neutro Abs: 3.4 10*3/uL (ref 1.7–7.7)
Neutrophils Relative %: 56 % (ref 43–77)
PLATELETS: 191 10*3/uL (ref 150–400)
RBC: 4.02 MIL/uL (ref 3.87–5.11)
RDW: 13.1 % (ref 11.5–15.5)
WBC: 5.9 10*3/uL (ref 4.0–10.5)

## 2013-06-26 LAB — URINALYSIS, ROUTINE W REFLEX MICROSCOPIC
BILIRUBIN URINE: NEGATIVE
GLUCOSE, UA: NEGATIVE mg/dL
HGB URINE DIPSTICK: NEGATIVE
Ketones, ur: NEGATIVE mg/dL
Nitrite: POSITIVE — AB
PH: 6 (ref 5.0–8.0)
Protein, ur: NEGATIVE mg/dL
SPECIFIC GRAVITY, URINE: 1.018 (ref 1.005–1.030)
UROBILINOGEN UA: 0.2 mg/dL (ref 0.0–1.0)

## 2013-06-26 LAB — I-STAT CHEM 8, ED
BUN: 11 mg/dL (ref 6–23)
CALCIUM ION: 1.14 mmol/L (ref 1.12–1.23)
CREATININE: 0.7 mg/dL (ref 0.50–1.10)
Chloride: 110 mEq/L (ref 96–112)
GLUCOSE: 84 mg/dL (ref 70–99)
HCT: 36 % (ref 36.0–46.0)
HEMOGLOBIN: 12.2 g/dL (ref 12.0–15.0)
Potassium: 3.9 mEq/L (ref 3.7–5.3)
Sodium: 142 mEq/L (ref 137–147)
TCO2: 25 mmol/L (ref 0–100)

## 2013-06-26 LAB — I-STAT CG4 LACTIC ACID, ED: Lactic Acid, Venous: 0.72 mmol/L (ref 0.5–2.2)

## 2013-06-26 LAB — URINE MICROSCOPIC-ADD ON

## 2013-06-26 LAB — WET PREP, GENITAL
CLUE CELLS WET PREP: NONE SEEN
Trich, Wet Prep: NONE SEEN
Yeast Wet Prep HPF POC: NONE SEEN

## 2013-06-26 LAB — POC URINE PREG, ED: Preg Test, Ur: NEGATIVE

## 2013-06-26 MED ORDER — PROMETHAZINE HCL 25 MG PO TABS: 25.0000 mg | ORAL_TABLET | Freq: Four times a day (QID) | ORAL | Status: DC | PRN

## 2013-06-26 MED ORDER — DEXTROSE 5 % IV SOLN
1.0000 g | Freq: Once | INTRAVENOUS | Status: AC
  Administered 2013-06-26: 1 g via INTRAVENOUS
  Filled 2013-06-26: qty 10

## 2013-06-26 MED ORDER — ONDANSETRON HCL 4 MG/2ML IJ SOLN
4.0000 mg | Freq: Once | INTRAMUSCULAR | Status: AC
  Administered 2013-06-26: 4 mg via INTRAVENOUS
  Filled 2013-06-26: qty 2

## 2013-06-26 MED ORDER — SODIUM CHLORIDE 0.9 % IV BOLUS (SEPSIS)
1000.0000 mL | Freq: Once | INTRAVENOUS | Status: AC
  Administered 2013-06-26: 1000 mL via INTRAVENOUS

## 2013-06-26 MED ORDER — ONDANSETRON 4 MG PO TBDP: 4.0000 mg | ORAL_TABLET | Freq: Three times a day (TID) | ORAL | Status: DC | PRN

## 2013-06-26 MED ORDER — FLUCONAZOLE 100 MG PO TABS
150.0000 mg | ORAL_TABLET | Freq: Once | ORAL | Status: AC
  Administered 2013-06-26: 150 mg via ORAL
  Filled 2013-06-26: qty 2

## 2013-06-26 MED ORDER — ONDANSETRON HCL 4 MG/2ML IJ SOLN
4.0000 mg | Freq: Once | INTRAMUSCULAR | Status: AC
Start: 2013-06-26 — End: 2013-06-26
  Administered 2013-06-26: 4 mg via INTRAVENOUS
  Filled 2013-06-26: qty 2

## 2013-06-26 MED ORDER — HYDROMORPHONE HCL PF 1 MG/ML IJ SOLN
1.0000 mg | Freq: Once | INTRAMUSCULAR | Status: AC
  Administered 2013-06-26: 1 mg via INTRAVENOUS
  Filled 2013-06-26: qty 1

## 2013-06-26 MED ORDER — CEPHALEXIN 500 MG PO CAPS
500.0000 mg | ORAL_CAPSULE | Freq: Four times a day (QID) | ORAL | Status: DC
Start: 2013-06-26 — End: 2013-07-13

## 2013-06-26 MED ORDER — MORPHINE SULFATE 4 MG/ML IJ SOLN
4.0000 mg | Freq: Once | INTRAMUSCULAR | Status: AC
  Administered 2013-06-26: 4 mg via INTRAVENOUS
  Filled 2013-06-26: qty 1

## 2013-06-26 MED ORDER — HYDROCODONE-ACETAMINOPHEN 5-325 MG PO TABS: 1.0000 | ORAL_TABLET | Freq: Four times a day (QID) | ORAL | Status: DC | PRN

## 2013-06-26 NOTE — ED Notes (Signed)
Pt sleeping quietly at this time.

## 2013-06-26 NOTE — ED Notes (Signed)
Pt. Stated, I've been having urinary retention and I've been very light headed and I almost passed out coming from the parking lot.

## 2013-06-26 NOTE — ED Notes (Signed)
Pt vomiting.

## 2013-06-26 NOTE — ED Notes (Addendum)
2 attempts at IV..  IV team called.Marland KitchenMarland Kitchen

## 2013-06-26 NOTE — Discharge Instructions (Signed)
Take the antibiotic as directed for the urinary tract infection. Return if not improved in 2 days. Take antinausea medicine 2 different types provided. Work on small sips of fluids to keep herself hydrated. If you have persistent vomiting and can't take antibiotic to have to return. Take pain medicine as needed.

## 2013-06-26 NOTE — ED Provider Notes (Signed)
TIME SEEN: 2:37 PM  CHIEF COMPLAINT: Dysuria, hematuria, urinary frequency and urgency, urinary retention  HPI: Patient is a 38 y.o. F with history of methamphetamine abuse currently sober for 6 months who recently moved here from [facility], bipolar disorder, PTSD, schizophrenia, hypertension, diabetes, obesity who presents to the emergency department with complaints of several days of dysuria, hematuria, urinary frequency and urgency and urinary retention. She states she's had frequent urinary tract infections and states this feels the same. She states she has not had a menstrual cycle in 2 years and states she went through "early menopause". She denies any vaginal bleeding or discharge. No new back pain. No numbness or focal weakness. She has had fever she states at home of 103. She has had nausea and vomiting. No diarrhea. No history of kidney stones.   Of note, pt has had 11 visits to the ED in 6 months.   ROS: See HPI Constitutional: no fever  Eyes: no drainage  ENT: no runny nose   Cardiovascular:  no chest pain  Resp: no SOB  GI: no vomiting GU: no dysuria Integumentary: no rash  Allergy: no hives  Musculoskeletal: no leg swelling  Neurological: no slurred speech ROS otherwise negative  PAST MEDICAL HISTORY/PAST SURGICAL HISTORY:  Past Medical History  Diagnosis Date  . Bipolar 1 disorder   . PTSD (post-traumatic stress disorder)   . Schizophrenia   . Depression   . Seizures   . Hypertension   . Diabetes mellitus without complication   . Asthma   . Obesity     MEDICATIONS:  Prior to Admission medications   Medication Sig Start Date End Date Taking? Authorizing Provider  acetaminophen (TYLENOL) 500 MG tablet Take 1,000 mg by mouth every 6 (six) hours as needed for mild pain.   Yes Historical Provider, MD  ibuprofen (ADVIL,MOTRIN) 800 MG tablet Take 1 tablet (800 mg total) by mouth every 8 (eight) hours as needed for mild pain or moderate pain. 04/30/13  Yes Clayton Bibles,  PA-C  naproxen sodium (ANAPROX) 220 MG tablet Take 880 mg by mouth 2 (two) times daily as needed (for pain).    Yes Historical Provider, MD    ALLERGIES:  Allergies  Allergen Reactions  . Aspirin Anaphylaxis and Rash  . Bee Venom Anaphylaxis and Rash  . Sulfa Antibiotics Anaphylaxis  . Ultram [Tramadol] Other (See Comments)    Migraines    SOCIAL HISTORY:  History  Substance Use Topics  . Smoking status: Never Smoker   . Smokeless tobacco: Not on file  . Alcohol Use: Yes    FAMILY HISTORY: No family history on file.  EXAM: BP 110/56  Pulse 70  Temp(Src) 97.8 F (36.6 C) (Oral)  Resp 18  SpO2 98% CONSTITUTIONAL: Alert and oriented and responds appropriately to questions. Well-appearing; well-nourished, patient appears comfortable, nontoxic, obese; patient is kissing her boyfriend when I walk into the room HEAD: Normocephalic EYES: Conjunctivae clear, PERRL ENT: normal nose; no rhinorrhea; moist mucous membranes; pharynx without lesions noted NECK: Supple, no meningismus, no LAD  CARD: RRR; S1 and S2 appreciated; no murmurs, no clicks, no rubs, no gallops RESP: Normal chest excursion without splinting or tachypnea; breath sounds clear and equal bilaterally; no wheezes, no rhonchi, no rales,  ABD/GI: Normal bowel sounds; non-distended; soft, no rebound, no guarding, obese, diffusely tender to palpation but when distracted patient's abdominal exam is benign GU:  Normal external genitalia, patient has thick white cottage cheese appearing vaginal discharge, no adnexal tenderness or fullness  on exam, no cervical motion tenderness, no vaginal bleeding BACK:  The back appears normal and is non-tender to palpation, there is no CVA tenderness EXT: Normal ROM in all joints; non-tender to palpation; no edema; normal capillary refill; no cyanosis    SKIN: Normal color for age and race; warm NEURO: Moves all extremities equally, sensation to light touch intact diffusely, cranial nerves  II through XII intact PSYCH: The patient's mood and manner are appropriate. Grooming and personal hygiene are appropriate.  MEDICAL DECISION MAKING: Patient here with symptoms concerning for UTI versus pyelonephritis. Her abdominal exam is benign when she is distracted. She did have a possible syncopal event prior to coming into the emergency department although this was not witnessed and initially has slightly low blood pressure but it was immediately repeated and was normal. She states "I feel very dehydrated like I need an IV".  We'll obtain labs, urinalysis with culture, pregnancy test, pelvic exam with cultures. We'll give IV fluids, pain and nausea medication.  ED PROGRESS: Patient's basic labs are unremarkable. Lactate normal. Urine pregnancy test negative. Urinalysis and wet prep pending.  When I went to reevaluate the patient to place a peripheral IV, she was comfortable and playing games on her phone.   4:30 PM  labs unremarkable. Signed out to Dr. Rogene Houston who will followup on urine and wet prep.    Angiocath insertion Performed by: Delice Bison Zvi Duplantis  Consent: Verbal consent obtained. Risks and benefits: risks, benefits and alternatives were discussed Time out: Immediately prior to procedure a "time out" was called to verify the correct patient, procedure, equipment, support staff and site/side marked as required.  Preparation: Patient was prepped and draped in the usual sterile fashion.  Vein Location: R EJ  Not Ultrasound Guided  Gauge: 20   Normal blood return and flush without difficulty Patient tolerance: Patient tolerated the procedure well with no immediate complications.      EKG Interpretation  Date/Time:  Saturday Jun 26 2013 13:34:35 EDT Ventricular Rate:  67 PR Interval:  158 QRS Duration: 84 QT Interval:  378 QTC Calculation: 399 R Axis:   91 Text Interpretation:  Normal sinus rhythm Rightward axis Possible Anterior infarct , age undetermined Abnormal  ECG No significant change since last tracing Confirmed by Alston Berrie,  DO, Kween Bacorn [phone]) on 06/26/2013 2:40:26 PM        [place], DO 06/27/13 1731

## 2013-06-26 NOTE — ED Notes (Signed)
Pt fell coming from the car to the front check in desk while in the outside parking lot. Pts friend rushed to ask for help getting patient up. Multiple bystanders were close by and patient was assisted to wheelchair, alert, oriented x4, c/o dizziness.

## 2013-06-26 NOTE — ED Notes (Signed)
Ginger ale and water given to patient. Patient asking for more pain meds.

## 2013-06-26 NOTE — ED Notes (Signed)
Pt requesting more pain med.  Rates pain #10 on pain scale 0/10

## 2013-06-26 NOTE — ED Provider Notes (Signed)
Patient turned over to me by the daytime emergency physician. Patient's wet prep in the urinalysis was still pending at that time. Prepping and up being negative urinalysis consistent with urinary tract infection but not consistent necessarily with pyelonephritis. Patient positive nitrite. Urine culture has been sent. Patient treated with 1 g of Rocephin. Patient hydrated with IV fluids here in antinausea medicine and pain medicines had some improvement. No further persistent vomiting. No fevers. Patient will be continued on Keflex. Patient states anytime she is on AmBisome is a yeast infection so Diflucan was given.  Fredia Sorrow, MD 06/26/13 2015

## 2013-06-28 LAB — URINE CULTURE

## 2013-06-28 LAB — GC/CHLAMYDIA PROBE AMP
CT Probe RNA: NEGATIVE
GC PROBE AMP APTIMA: NEGATIVE

## 2013-06-29 ENCOUNTER — Telehealth (HOSPITAL_BASED_OUTPATIENT_CLINIC_OR_DEPARTMENT_OTHER): Payer: Self-pay | Admitting: Emergency Medicine

## 2013-06-29 NOTE — Telephone Encounter (Signed)
Post ED Visit - Positive Culture Follow-up  Culture report reviewed by antimicrobial stewardship pharmacist: [] Wes Dulaney, Pharm.D., BCPS [] Jeremy Frens, Pharm.D., BCPS [] Elizabeth Martin, Pharm.D., BCPS [x] Minh Pham, Pharm.D., BCPS, AAHIVP [] Michelle Turner, Pharm.D., BCPS, AAHIVP [] Nathan Cope, Pharm.D.  Positive urine culture Treated with Keflex, organism sensitive to the same and no further patient follow-up is required at this time.  Kaynen Minner 06/29/2013, 1:13 PM   

## 2013-07-06 ENCOUNTER — Emergency Department (HOSPITAL_COMMUNITY)
Admission: EM | Admit: 2013-07-06 | Discharge: 2013-07-06 | Disposition: A | Discharge status: Home / Self Care | Payer: Medicaid - Out of State | Attending: Emergency Medicine | Admitting: Emergency Medicine

## 2013-07-06 ENCOUNTER — Encounter (HOSPITAL_COMMUNITY): Payer: Self-pay | Admitting: Emergency Medicine

## 2013-07-06 DIAGNOSIS — E669 Obesity, unspecified: Secondary | ICD-10-CM | POA: Insufficient documentation

## 2013-07-06 DIAGNOSIS — S90569A Insect bite (nonvenomous), unspecified ankle, initial encounter: Secondary | ICD-10-CM | POA: Insufficient documentation

## 2013-07-06 DIAGNOSIS — Z791 Long term (current) use of non-steroidal anti-inflammatories (NSAID): Secondary | ICD-10-CM | POA: Insufficient documentation

## 2013-07-06 DIAGNOSIS — Z792 Long term (current) use of antibiotics: Secondary | ICD-10-CM | POA: Insufficient documentation

## 2013-07-06 DIAGNOSIS — W57XXXA Bitten or stung by nonvenomous insect and other nonvenomous arthropods, initial encounter: Principal | ICD-10-CM

## 2013-07-06 DIAGNOSIS — I10 Essential (primary) hypertension: Secondary | ICD-10-CM | POA: Insufficient documentation

## 2013-07-06 DIAGNOSIS — Z79899 Other long term (current) drug therapy: Secondary | ICD-10-CM | POA: Insufficient documentation

## 2013-07-06 DIAGNOSIS — IMO0001 Reserved for inherently not codable concepts without codable children: Secondary | ICD-10-CM

## 2013-07-06 DIAGNOSIS — Z8659 Personal history of other mental and behavioral disorders: Secondary | ICD-10-CM | POA: Insufficient documentation

## 2013-07-06 DIAGNOSIS — Y9389 Activity, other specified: Secondary | ICD-10-CM | POA: Insufficient documentation

## 2013-07-06 DIAGNOSIS — Z8669 Personal history of other diseases of the nervous system and sense organs: Secondary | ICD-10-CM | POA: Insufficient documentation

## 2013-07-06 DIAGNOSIS — J45909 Unspecified asthma, uncomplicated: Secondary | ICD-10-CM | POA: Insufficient documentation

## 2013-07-06 DIAGNOSIS — Y9289 Other specified places as the place of occurrence of the external cause: Secondary | ICD-10-CM | POA: Insufficient documentation

## 2013-07-06 MED ORDER — CEPHALEXIN 500 MG PO CAPS: 500.0000 mg | ORAL_CAPSULE | Freq: Four times a day (QID) | ORAL | Status: DC

## 2013-07-06 MED ORDER — FLUCONAZOLE 200 MG PO TABS: 200.0000 mg | ORAL_TABLET | Freq: Once | ORAL | Status: DC | PRN

## 2013-07-06 NOTE — ED Notes (Signed)
Pt in c/o possible insect bite to bilateral calfs, symptoms started two days ago, small area with redness noted, pt states pain increased today

## 2013-07-06 NOTE — ED Provider Notes (Signed)
CSN: [id number]     Arrival date & time 07/06/13  1851 History   First MD Initiated Contact with Patient 07/06/13 1932     This chart was scribed for non-physician practitioner, Monico Blitz  PA-C working with Ephraim Hamburger, MD by Forrestine Him, ED Scribe. This patient was seen in room TR10C/TR10C and the patient's care was started at 8:54 PM.   Chief Complaint  Patient presents with  . Insect Bite   The history is provided by the patient. No language interpreter was used.    HPI Comments: [name] [name] is a 38 y.o. Female with a PMHx Bipolar 1 disorder, PTSD, homelessness, HTN, DM, Schizophrenia, Asthma, and depression who presents to the Emergency Department complaining of a possible insect bite to the L calf x 2 days. She has noted some redness to area with increasing pain onset today. She states the area started off as 2 small dots and has recently progressively worsened. She also reports nausea and radiating pain into her L groin since onset of insect bite. She has not tried anything OTC for symptoms. At this time she denies any fever or chills. She has no other pertinent past medical history. No other concerns this visit.  Past Medical History  Diagnosis Date  . Bipolar 1 disorder   . PTSD (post-traumatic stress disorder)   . Schizophrenia   . Depression   . Seizures   . Hypertension   . Diabetes mellitus without complication   . Asthma   . Obesity    Past Surgical History  Procedure Laterality Date  . Cholecystectomy    . Cesarean section     History reviewed. No pertinent family history. History  Substance Use Topics  . Smoking status: Never Smoker   . Smokeless tobacco: Not on file  . Alcohol Use: Yes   OB History   Grav Para Term Preterm Abortions TAB SAB Ect Mult Living                 Review of Systems  Constitutional: Negative for fever and chills.  HENT: Negative for congestion.   Eyes: Negative for redness.  Respiratory: Negative for cough.    Gastrointestinal: Positive for nausea.  Skin: Positive for wound. Negative for rash.  Psychiatric/Behavioral: Negative for confusion.      Allergies  Aspirin; Bee venom; Sulfa antibiotics; and Ultram  Home Medications   Prior to Admission medications   Medication Sig Start Date End Date Taking? Authorizing Provider  acetaminophen (TYLENOL) 500 MG tablet Take 1,000 mg by mouth every 6 (six) hours as needed for mild pain.    Historical Provider, MD  cephALEXin (KEFLEX) 500 MG capsule Take 1 capsule (500 mg total) by mouth 4 (four) times daily. 06/26/13   Fredia Sorrow, MD  cephALEXin (KEFLEX) 500 MG capsule Take 1 capsule (500 mg total) by mouth 4 (four) times daily. 07/06/13   Porschia Willbanks, PA-C  fluconazole (DIFLUCAN) 200 MG tablet Take 1 tablet (200 mg total) by mouth once as needed (Yeast infection). 07/06/13   Tulsi Crossett, PA-C  HYDROcodone-acetaminophen (NORCO/VICODIN) 5-325 MG per tablet Take 1-2 tablets by mouth every 6 (six) hours as needed for moderate pain. 06/26/13   Fredia Sorrow, MD  ibuprofen (ADVIL,MOTRIN) 800 MG tablet Take 1 tablet (800 mg total) by mouth every 8 (eight) hours as needed for mild pain or moderate pain. 04/30/13   Clayton Bibles, PA-C  naproxen sodium (ANAPROX) 220 MG tablet Take 880 mg by mouth 2 (two) times daily as  needed (for pain).     Historical Provider, MD  ondansetron (ZOFRAN ODT) 4 MG disintegrating tablet Take 1 tablet (4 mg total) by mouth every 8 (eight) hours as needed for nausea or vomiting. 06/26/13   Fredia Sorrow, MD  promethazine (PHENERGAN) 25 MG tablet Take 1 tablet (25 mg total) by mouth every 6 (six) hours as needed for nausea or vomiting. 06/26/13   Fredia Sorrow, MD   Triage Vitals: BP 124/87  Pulse 77  Temp(Src) 98.7 F (37.1 C) (Oral)  SpO2 98%   Physical Exam  Nursing note and vitals reviewed. Constitutional: She is oriented to person, place, and time. She appears well-developed and well-nourished. No distress.  HENT:   Head: Normocephalic.  Mouth/Throat: Oropharynx is clear and moist.  Eyes: Conjunctivae and EOM are normal. Pupils are equal, round, and reactive to light.  Cardiovascular: Normal rate, regular rhythm and intact distal pulses.   Pulmonary/Chest: Effort normal and breath sounds normal. No stridor.  Abdominal: Soft. Bowel sounds are normal.  Musculoskeletal: Normal range of motion.  Neurological: She is alert and oriented to person, place, and time.  Skin:  Mildly excoriated, erythematous, pinpoint but bites to bilateral inner calves. No induration, discharge, warmth or tenderness to palpation.  Psychiatric: She has a normal mood and affect.    ED Course  Procedures (including critical care time)  DIAGNOSTIC STUDIES: Oxygen Saturation is 98% on RA, Normal by my interpretation.    COORDINATION OF CARE: 9:00 PM-Discussed treatment plan with pt at bedside and pt agreed to plan.     Labs Review Labs Reviewed - No data to display  Imaging Review No results found.   EKG Interpretation None      MDM   Final diagnoses:  Uncomplicated insect bite    Filed Vitals:   07/06/13 1856 07/06/13 2126  BP: 124/87 140/68  Pulse: 77 80  Temp: 98.7 F (37.1 C)   TempSrc: Oral   Resp:  18  SpO2: 98% 98%    Medications - No data to display  [name] [name] is a 38 y.o. female presenting with 2 insect bites to bilateral calves. No signs of overt infection. Patient request Diflucan because she states that antibiotics cause yeast infections.  Evaluation does not show pathology that would require ongoing emergent intervention or inpatient treatment. Pt is hemodynamically stable and mentating appropriately. Discussed findings and plan with patient/guardian, who agrees with care plan. All questions answered. Return precautions discussed and outpatient follow up given.   Discharge Medication List as of 07/06/2013  9:01 PM    START taking these medications   Details  !! cephALEXin (KEFLEX)  500 MG capsule Take 1 capsule (500 mg total) by mouth 4 (four) times daily., Starting 07/06/2013, Until Discontinued, Print    fluconazole (DIFLUCAN) 200 MG tablet Take 1 tablet (200 mg total) by mouth once as needed (Yeast infection)., Starting 07/06/2013, Until Discontinued, Print     !! - Potential duplicate medications found. Please discuss with provider.         I personally performed the services described in this documentation, which was scribed in my presence. The recorded information has been reviewed and is accurate.    Monico Blitz, PA-C 07/06/13 2156

## 2013-07-06 NOTE — Discharge Instructions (Signed)
If you see signs of infection (warmth, redness, tenderness, pus, sharp increase in pain, fever, red streaking) immediately return to the emergency department.  Do not hesitate to return to the Emergency Department for any new, worsening or concerning symptoms.   If you do not have a primary care doctor you can establish one at the   [place], flies, fleas, bedbugs, and many other insects can bite. Insect bites are different from insect stings. A sting is when venom is injected into the skin. Some insect bites can transmit infectious diseases. SYMPTOMS  Insect bites usually turn red, swell, and itch for 2 to 4 days. They often go away on their own. TREATMENT  Your caregiver may prescribe antibiotic medicines if a bacterial infection develops in the bite. HOME CARE INSTRUCTIONS  Do not scratch the bite area.  Keep the bite area clean and dry. Wash the bite area thoroughly with soap and water.  Put ice or cool compresses on the bite area.  Put ice in a plastic bag.  Place a towel between your skin and the bag.  Leave the ice on for 20 minutes, 4 times a day for the first 2 to 3 days, or as directed.  You may apply a baking soda paste, cortisone cream, or calamine lotion to the bite area as directed by your caregiver. This can help reduce itching and swelling.  Only take over-the-counter or prescription medicines as directed by your caregiver.  If you are given antibiotics, take them as directed. Finish them even if you start to feel better. You may need a tetanus shot if:  You cannot remember when you had your last tetanus shot.  You have never had a tetanus shot.  The injury broke your skin. If you get a tetanus shot, your arm may swell, get red, and feel warm to the touch. This is common and not a problem. If you need a tetanus shot and you choose not to have one, there is a rare chance of getting tetanus. Sickness from tetanus can be  serious. SEEK IMMEDIATE MEDICAL CARE IF:   You have increased pain, redness, or swelling in the bite area.  You see a red line on the skin coming from the bite.  You have a fever.  You have joint pain.  You have a headache or neck pain.  You have unusual weakness.  You have a rash.  You have chest pain or shortness of breath.  You have abdominal pain, nausea, or vomiting.  You feel unusually tired or sleepy. MAKE SURE YOU:   Understand these instructions.  Will watch your condition.  Will get help right away if you are not doing well or get worse. Document Released: 02/22/2004 Document Revised: 04/08/2011 Document Reviewed: 08/15/2010 [facility] Patient Information 2014 [place].  [place] [id number] [phone]  After you establish care. Let them know you were seen in the emergency room. They must obtain records for further management.

## 2013-07-06 NOTE — ED Notes (Signed)
Pager [phone]

## 2013-07-10 NOTE — ED Provider Notes (Signed)
Medical screening examination/treatment/procedure(s) were performed by non-physician practitioner and as supervising physician I was immediately available for consultation/collaboration.   EKG Interpretation None        Ephraim Hamburger, MD 07/10/13 [phone]

## 2013-07-13 ENCOUNTER — Emergency Department (HOSPITAL_COMMUNITY): Payer: Medicaid - Out of State

## 2013-07-13 ENCOUNTER — Emergency Department (HOSPITAL_COMMUNITY)
Admission: EM | Admit: 2013-07-13 | Discharge: 2013-07-13 | Disposition: A | Discharge status: Home / Self Care | Payer: Medicaid - Out of State | Attending: Emergency Medicine | Admitting: Emergency Medicine

## 2013-07-13 ENCOUNTER — Encounter (HOSPITAL_COMMUNITY): Payer: Self-pay | Admitting: Emergency Medicine

## 2013-07-13 DIAGNOSIS — Z79899 Other long term (current) drug therapy: Secondary | ICD-10-CM | POA: Insufficient documentation

## 2013-07-13 DIAGNOSIS — E119 Type 2 diabetes mellitus without complications: Secondary | ICD-10-CM | POA: Insufficient documentation

## 2013-07-13 DIAGNOSIS — N390 Urinary tract infection, site not specified: Secondary | ICD-10-CM

## 2013-07-13 DIAGNOSIS — E669 Obesity, unspecified: Secondary | ICD-10-CM | POA: Insufficient documentation

## 2013-07-13 DIAGNOSIS — Z8659 Personal history of other mental and behavioral disorders: Secondary | ICD-10-CM | POA: Insufficient documentation

## 2013-07-13 DIAGNOSIS — Z3202 Encounter for pregnancy test, result negative: Secondary | ICD-10-CM | POA: Insufficient documentation

## 2013-07-13 DIAGNOSIS — F3289 Other specified depressive episodes: Secondary | ICD-10-CM | POA: Insufficient documentation

## 2013-07-13 DIAGNOSIS — G40909 Epilepsy, unspecified, not intractable, without status epilepticus: Secondary | ICD-10-CM | POA: Insufficient documentation

## 2013-07-13 DIAGNOSIS — F329 Major depressive disorder, single episode, unspecified: Secondary | ICD-10-CM | POA: Insufficient documentation

## 2013-07-13 DIAGNOSIS — Z792 Long term (current) use of antibiotics: Secondary | ICD-10-CM | POA: Insufficient documentation

## 2013-07-13 DIAGNOSIS — R079 Chest pain, unspecified: Secondary | ICD-10-CM | POA: Insufficient documentation

## 2013-07-13 DIAGNOSIS — J45909 Unspecified asthma, uncomplicated: Secondary | ICD-10-CM | POA: Insufficient documentation

## 2013-07-13 DIAGNOSIS — R569 Unspecified convulsions: Secondary | ICD-10-CM

## 2013-07-13 DIAGNOSIS — I10 Essential (primary) hypertension: Secondary | ICD-10-CM | POA: Insufficient documentation

## 2013-07-13 LAB — CBC WITH DIFFERENTIAL/PLATELET
Basophils Absolute: 0 10*3/uL (ref 0.0–0.1)
Basophils Relative: 1 % (ref 0–1)
EOS ABS: 0.1 10*3/uL (ref 0.0–0.7)
Eosinophils Relative: 1 % (ref 0–5)
HEMATOCRIT: 37.9 % (ref 36.0–46.0)
Hemoglobin: 13.1 g/dL (ref 12.0–15.0)
LYMPHS ABS: 2 10*3/uL (ref 0.7–4.0)
LYMPHS PCT: 29 % (ref 12–46)
MCH: 30.3 pg (ref 26.0–34.0)
MCHC: 34.6 g/dL (ref 30.0–36.0)
MCV: 87.7 fL (ref 78.0–100.0)
MONO ABS: 0.3 10*3/uL (ref 0.1–1.0)
Monocytes Relative: 5 % (ref 3–12)
Neutro Abs: 4.3 10*3/uL (ref 1.7–7.7)
Neutrophils Relative %: 64 % (ref 43–77)
Platelets: 215 10*3/uL (ref 150–400)
RBC: 4.32 MIL/uL (ref 3.87–5.11)
RDW: 13 % (ref 11.5–15.5)
WBC: 6.7 10*3/uL (ref 4.0–10.5)

## 2013-07-13 LAB — URINALYSIS, ROUTINE W REFLEX MICROSCOPIC
Bilirubin Urine: NEGATIVE
GLUCOSE, UA: NEGATIVE mg/dL
Ketones, ur: NEGATIVE mg/dL
Nitrite: POSITIVE — AB
PH: 6 (ref 5.0–8.0)
Protein, ur: NEGATIVE mg/dL
SPECIFIC GRAVITY, URINE: 1.02 (ref 1.005–1.030)
Urobilinogen, UA: 0.2 mg/dL (ref 0.0–1.0)

## 2013-07-13 LAB — I-STAT TROPONIN, ED: Troponin i, poc: 0 ng/mL (ref 0.00–0.08)

## 2013-07-13 LAB — BASIC METABOLIC PANEL
BUN: 16 mg/dL (ref 6–23)
CHLORIDE: 104 meq/L (ref 96–112)
CO2: 24 meq/L (ref 19–32)
CREATININE: 0.56 mg/dL (ref 0.50–1.10)
Calcium: 9.5 mg/dL (ref 8.4–10.5)
GFR calc Af Amer: >90 mL/min (ref >90)
GFR calc non Af Amer: >90 mL/min (ref >90)
Glucose, Bld: 91 mg/dL (ref 70–99)
Potassium: 4.4 mEq/L (ref 3.7–5.3)
Sodium: 141 mEq/L (ref 137–147)

## 2013-07-13 LAB — URINE MICROSCOPIC-ADD ON

## 2013-07-13 LAB — POC URINE PREG, ED: Preg Test, Ur: NEGATIVE

## 2013-07-13 LAB — PHENYTOIN LEVEL, TOTAL

## 2013-07-13 MED ORDER — CEPHALEXIN 500 MG PO CAPS: 500.0000 mg | ORAL_CAPSULE | Freq: Four times a day (QID) | ORAL | Status: DC

## 2013-07-13 MED ORDER — SODIUM CHLORIDE 0.9 % IV SOLN
1000.0000 mg | Freq: Once | INTRAVENOUS | Status: AC
  Administered 2013-07-13: 1000 mg via INTRAVENOUS
  Filled 2013-07-13: qty 20

## 2013-07-13 MED ORDER — OXYCODONE-ACETAMINOPHEN 5-325 MG PO TABS: 2.0000 | ORAL_TABLET | ORAL | Status: DC | PRN

## 2013-07-13 MED ORDER — PHENYTOIN SODIUM EXTENDED 100 MG PO CAPS: 300.0000 mg | ORAL_CAPSULE | Freq: Three times a day (TID) | ORAL | Status: DC

## 2013-07-13 MED ORDER — ONDANSETRON HCL 4 MG/2ML IJ SOLN
4.0000 mg | Freq: Once | INTRAMUSCULAR | Status: AC
  Administered 2013-07-13: 4 mg via INTRAVENOUS
  Filled 2013-07-13: qty 2

## 2013-07-13 MED ORDER — DEXTROSE 5 % IV SOLN
1.0000 g | Freq: Once | INTRAVENOUS | Status: AC
  Administered 2013-07-13: 1 g via INTRAVENOUS
  Filled 2013-07-13: qty 10

## 2013-07-13 MED ORDER — OXYCODONE-ACETAMINOPHEN 5-325 MG PO TABS
1.0000 | ORAL_TABLET | Freq: Once | ORAL | Status: AC
  Administered 2013-07-13: 1 via ORAL
  Filled 2013-07-13: qty 1

## 2013-07-13 MED ORDER — SODIUM CHLORIDE 0.9 % IV SOLN
INTRAVENOUS | Status: DC
  Administered 2013-07-13: 17:00:00 via INTRAVENOUS

## 2013-07-13 NOTE — ED Provider Notes (Signed)
CSN: [id number]     Arrival date & time 07/13/13  1346 History   First MD Initiated Contact with Patient 07/13/13 1507     Chief Complaint  Patient presents with  . Chest Pain  . Seizures     (Consider location/radiation/quality/duration/timing/severity/associated sxs/prior Treatment) HPI   [name] [name] is a 38 y.o. female who presents for evaluation of a seizure. She had 2 seizures, this morning. She last took dilantin, one week ago. She is out of her medicines because several of them were stolen. Each seizure was witnessed, and lasted 5 minutes and was followed by a short post state. The patient feels like she is weak on her left side since the seizure, and that she bit her tongue. She denies recent fever, chills, nausea, vomiting, weakness, or dizziness. There are no other known modifying factors.   Past Medical History  Diagnosis Date  . Bipolar 1 disorder   . PTSD (post-traumatic stress disorder)   . Schizophrenia   . Depression   . Seizures   . Hypertension   . Diabetes mellitus without complication   . Asthma   . Obesity    Past Surgical History  Procedure Laterality Date  . Cholecystectomy    . Cesarean section     History reviewed. No pertinent family history. History  Substance Use Topics  . Smoking status: Never Smoker   . Smokeless tobacco: Not on file  . Alcohol Use: Yes   OB History   Grav Para Term Preterm Abortions TAB SAB Ect Mult Living                 Review of Systems  All other systems reviewed and are negative.     Allergies  Aspirin; Bee venom; Sulfa antibiotics; and Ultram  Home Medications   Prior to Admission medications   Medication Sig Start Date End Date Taking? Authorizing Provider  diphenhydrAMINE (BENADRYL) 25 mg capsule Take 100 mg by mouth daily as needed for itching or allergies.   Yes Historical Provider, MD  ibuprofen (ADVIL,MOTRIN) 200 MG tablet Take 400 mg by mouth every 6 (six) hours as needed for mild pain.   Yes  Historical Provider, MD  naproxen sodium (ANAPROX) 220 MG tablet Take 1,320 mg by mouth 3 (three) times daily as needed (for pain).    Yes Historical Provider, MD  ondansetron (ZOFRAN-ODT) 4 MG disintegrating tablet Take 4 mg by mouth every 8 (eight) hours as needed for nausea or vomiting.   Yes Historical Provider, MD  trolamine salicylate (ASPERCREME) 10 % cream Apply 1 application topically as needed for muscle pain.   Yes Historical Provider, MD  cephALEXin (KEFLEX) 500 MG capsule Take 1 capsule (500 mg total) by mouth 4 (four) times daily. 07/13/13   Richarda Blade, MD  oxyCODONE-acetaminophen (PERCOCET) 5-325 MG per tablet Take 2 tablets by mouth every 4 (four) hours as needed. 07/13/13   Richarda Blade, MD  phenytoin (DILANTIN) 100 MG ER capsule Take 3 capsules (300 mg total) by mouth 3 (three) times daily. 07/13/13   Richarda Blade, MD   BP 129/96  Pulse 79  Resp 14  SpO2 97% Physical Exam  Nursing note and vitals reviewed. Constitutional: She is oriented to person, place, and time. She appears well-developed.  Obese  HENT:  Head: Normocephalic and atraumatic.  No visible tongue trauma  Eyes: Conjunctivae and EOM are normal. Pupils are equal, round, and reactive to light.  Neck: Normal range of motion and phonation normal. Neck  supple.  Cardiovascular: Normal rate, regular rhythm and intact distal pulses.   Pulmonary/Chest: Effort normal and breath sounds normal. She exhibits no tenderness.  Abdominal: Soft. She exhibits no distension. There is no tenderness. There is no guarding.  Musculoskeletal: Normal range of motion.  Left arm, strength 4/5 left leg. Strength 3/5. Right arm, leg, and strength are 5 out of 5.  Neurological: She is alert and oriented to person, place, and time. She exhibits normal muscle tone.  No dysarthria, aphasia or nystagmus  Skin: Skin is warm and dry.  Psychiatric: She has a normal mood and affect. Her behavior is normal. Judgment and thought content  normal.    ED Course  Procedures (including critical care time)  Medications  0.9 %  sodium chloride infusion ( Intravenous New Bag/Given 07/13/13 1655)  oxyCODONE-acetaminophen (PERCOCET/ROXICET) 5-325 MG per tablet 1 tablet (not administered)  phenytoin (DILANTIN) 1,000 mg in sodium chloride 0.9 % 250 mL IVPB (0 mg Intravenous Stopped 07/13/13 1735)  cefTRIAXone (ROCEPHIN) 1 g in dextrose 5 % 50 mL IVPB (0 g Intravenous Stopped 07/13/13 1824)  ondansetron (ZOFRAN) injection 4 mg (4 mg Intravenous Given 07/13/13 1750)    Patient Vitals for the past 24 hrs:  BP Pulse Resp SpO2  07/13/13 1845 129/96 mmHg 79 14 97 %  07/13/13 1833 - - 14 95 %  07/13/13 1745 128/84 mmHg 73 27 95 %  07/13/13 1738 121/62 mmHg 77 22 95 %  07/13/13 1715 121/62 mmHg 69 19 98 %  07/13/13 1700 133/74 mmHg 69 14 98 %  07/13/13 1625 96/62 mmHg 67 19 98 %  07/13/13 1545 131/85 mmHg 59 13 98 %  07/13/13 1535 117/102 mmHg 63 13 99 %    7:03 PM Reevaluation with update and discussion. After initial assessment and treatment, an updated evaluation reveals she is feeling some better with residual mild HA and chest pain, and some tingling in left leg. WENTZ,ELLIOTT L   Labs Review Labs Reviewed  URINALYSIS, ROUTINE W REFLEX MICROSCOPIC - Abnormal; Notable for the following:    APPearance CLOUDY (*)    Hgb urine dipstick SMALL (*)    Nitrite POSITIVE (*)    Leukocytes, UA SMALL (*)    All other components within normal limits  PHENYTOIN LEVEL, TOTAL - Abnormal; Notable for the following:    Phenytoin Lvl <2.5 (*)    All other components within normal limits  URINE MICROSCOPIC-ADD ON - Abnormal; Notable for the following:    Squamous Epithelial / LPF FEW (*)    Bacteria, UA MANY (*)    All other components within normal limits  BASIC METABOLIC PANEL  CBC WITH DIFFERENTIAL  I-STAT TROPOININ, ED  POC URINE PREG, ED    Imaging Review Ct Head Wo Contrast  07/13/2013   CLINICAL DATA:  Seizure. Left-sided  weakness. Chest pain. Left-sided weakness.  EXAM: CT HEAD WITHOUT CONTRAST  TECHNIQUE: Contiguous axial images were obtained from the base of the skull through the vertex without intravenous contrast.  COMPARISON:  05/07/2013  FINDINGS: The brainstem, cerebellum, cerebral peduncles, thalamus, basal ganglia, basilar cisterns, and ventricular system appear within normal limits. No intracranial hemorrhage, mass lesion, or acute CVA. Minimal chronic ethmoid and maxillary sinusitis.  IMPRESSION: 1. Minimal chronic ethmoid and maxillary sinusitis. No additional significant abnormality observed.   Electronically Signed   By: Sherryl Barters M.D.   On: 07/13/2013 15:37   Dg Chest Portable 1 View  07/13/2013   CLINICAL DATA:  Mid chest pain radiating to  the left  EXAM: PORTABLE CHEST - 1 VIEW  COMPARISON:  04/09/2013  FINDINGS: Generous heart size, within normal limits when accounting for technique. Unremarkable upper mediastinal contours. Remote anterior left third and fourth rib fractures. No acute osseous findings. No consolidation, edema, effusion, or pneumothorax.  IMPRESSION: No active disease.   Electronically Signed   By: Jorje Guild M.D.   On: 07/13/2013 14:43     EKG Interpretation   Date/Time:  Tuesday July 13 2013 18:32:51 EDT Ventricular Rate:  71 PR Interval:  182 QRS Duration: 93 QT Interval:  400 QTC Calculation: 435 R Axis:   117 Text Interpretation:  Sinus rhythm Right axis deviation Low voltage,  precordial leads Since last tracing of earlier today No significant change  was found Confirmed by Eulis Foster  MD, ELLIOTT [phone]) on 07/13/2013 6:41:33 PM      MDM   Final diagnoses:  Seizure secondary to subtherapeutic anticonvulsant medication  UTI (lower urinary tract infection)    Seizure related to medication noncompliance. Incidental urinary tract infection. She has myalgia related to seizures, today. Doubt serious bacterial infection, metabolic instability, or impending vascular  collapse  Nursing Notes Reviewed/ Care Coordinated Applicable Imaging Reviewed Interpretation of Laboratory Data incorporated into ED treatment  The patient appears reasonably screened and/or stabilized for discharge and I doubt any other medical condition or other [facility] requiring further screening, evaluation, or treatment in the ED at this time prior to discharge.  Plan: Home Medications- Dilantin; Home Treatments- rest; return here if the recommended treatment, does not improve the symptoms; Recommended follow up- PCP of choice asap   Richarda Blade, MD 07/14/13 1155

## 2013-07-13 NOTE — ED Notes (Addendum)
[place] [facility] with c/o seizure x 2 lasting 2-3 minutes each per witnesses, reports lower back pain related to falling during seizure, denies head pain or injury.  Pt was not postictal with EMS but c/o central to left 10/10 chest heaviness and pressure with increased left sided weakness.  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  Given 1 nitro with 9.5/10 decrease.  Not given aspirin due to allergy.  Pt reports N/V/D for the last several days. Pt in NAD, A&O.

## 2013-07-13 NOTE — Discharge Instructions (Signed)
Use the Resource guide to find a doctor to see in 1-2 weeks    Seizure, Adult A seizure is abnormal electrical activity in the brain. Seizures usually last from 30 seconds to 2 minutes. There are various types of seizures. Before a seizure, you may have a warning sensation (aura) that a seizure is about to occur. An aura may include the following symptoms:   Fear or anxiety.  Nausea.  Feeling like the room is spinning (vertigo).  Vision changes, such as seeing flashing lights or spots. Common symptoms during a seizure include:  A change in attention or behavior (altered mental status).  Convulsions with rhythmic jerking movements.  Drooling.  Rapid eye movements.  Grunting.  Loss of bladder and bowel control.  Bitter taste in the mouth.  Tongue biting. After a seizure, you may feel confused and sleepy. You may also have an injury resulting from convulsions during the seizure. HOME CARE INSTRUCTIONS   If you are given medicines, take them exactly as prescribed by your health care provider.  Keep all follow-up appointments as directed by your health care provider.  Do not swim or drive or engage in risky activity during which a seizure could cause further injury to you or others until your health care provider says it is OK.  Get adequate rest.  Teach friends and family what to do if you have a seizure. They should:  Lay you on the ground to prevent a fall.  Put a cushion under your head.  Loosen any tight clothing around your neck.  Turn you on your side. If vomiting occurs, this helps keep your airway clear.  Stay with you until you recover.  Know whether or not you need emergency care. SEEK IMMEDIATE MEDICAL CARE IF:  The seizure lasts longer than 5 minutes.  The seizure is severe or you do not wake up immediately after the seizure.  You have an altered mental status after the seizure.  You are having more frequent or worsening seizures. Someone  should drive you to the emergency department or call local emergency services (911 in U.[place] MAKE SURE YOU:  Understand these instructions.  Will watch your condition.  Will get help right away if you are not doing well or get worse. Document Released: 01/12/2000 Document Revised: 11/04/2012 Document Reviewed: 08/26/2012 [facility] Patient Information 2014 [place].  Urinary Tract Infection A urinary tract infection (UTI) can occur any place along the urinary tract. The tract includes the kidneys, ureters, bladder, and urethra. A type of germ called bacteria often causes a UTI. UTIs are often helped with antibiotic medicine.  HOME CARE   If given, take antibiotics as told by your doctor. Finish them even if you start to feel better.  Drink enough fluids to keep your pee (urine) clear or pale yellow.  Avoid tea, drinks with caffeine, and bubbly (carbonated) drinks.  Pee often. Avoid holding your pee in for a long time.  Pee before and after having sex (intercourse).  Wipe from front to back after you poop (bowel movement) if you are a woman. Use each tissue only once. GET HELP RIGHT AWAY IF:   You have back pain.  You have lower belly (abdominal) pain.  You have chills.  You feel sick to your stomach (nauseous).  You throw up (vomit).  Your burning or discomfort with peeing does not go away.  You have a fever.  Your symptoms are not better in 3 days. MAKE SURE YOU:   Understand these  instructions.  Will watch your condition.  Will get help right away if you are not doing well or get worse. Document Released: 07/03/2007 Document Revised: 10/09/2011 Document Reviewed: 08/15/2011 [facility] Patient Information 2014 [place], [place].  Emergency Department Resource Guide 1) Find a Doctor and Pay Out of Pocket Although you won't have to find out who is covered by your insurance plan, it is a good idea to ask around and get recommendations. You will then need to call  the office and see if the doctor you have chosen will accept you as a new patient and what types of options they offer for patients who are self-pay. Some doctors offer discounts or will set up payment plans for their patients who do not have insurance, but you will need to ask so you aren't surprised when you get to your appointment.  2) Contact Your Local Health Department Not all health departments have doctors that can see patients for sick visits, but many do, so it is worth a call to see if yours does. If you don't know where your local health department is, you can check in your phone book. The CDC also has a tool to help you locate your state's health department, and many state websites also have listings of all of their local health departments.  3) Find a [facility] If your illness is not likely to be very severe or complicated, you may want to try a walk in clinic. These are popping up all over the country in pharmacies, drugstores, and shopping centers. They're usually staffed by nurse practitioners or physician assistants that have been trained to treat common illnesses and complaints. They're usually fairly quick and inexpensive. However, if you have serious medical issues or chronic medical problems, these are probably not your best option.  No Primary Care Doctor: - Call Health Connect at  [phone] - they can help you locate a primary care doctor that  accepts your insurance, provides certain services, etc. - Physician Referral Service- [phone]  Chronic Pain Problems: Organization         Address  Phone   Notes  [facility]  [phone] Patients need to be referred by their primary care doctor.   Medication Assistance: Organization         Address  Phone   Notes  [facility] Medication [facility] [address] [place], [place] [address] [phone] --Must be a resident of [facility] -- Must have NO insurance  coverage whatsoever (no Medicaid/ Medicare, etc.) -- The pt. MUST have a primary care doctor that directs their care regularly and follows them in the community   MedAssist  [phone]   [employer]  [phone]    Agencies that provide inexpensive medical care: Organization         Address  Phone   Notes  [place]  [phone]   [facility]    [phone]   [facility] [place], [place] [address] [phone]   [place] [address] [place] [phone]   Planned Parenthood    [phone]   [facility]    [phone]   [place] and [place] [address], [place] Phone:  [phone], Fax:  [phone] Hours of Operation:  9 am - 6 pm, M-F.  Also accepts Medicaid/Medicare and self-pay.  [facility] for [place], [address], [place] Phone: [phone], Fax: [phone]. Hours of Operation:  8:30 am - 5:30 pm, M-F.  Also accepts Medicaid and self-pay.  [facility] [address] [place] Phone: [phone]   [place], [place], [place] [phone], Ext. [phone] Mondays & Thursdays: 7-9 AM.  First 15 patients are seen on a first come, first serve basis.    [place] Providers:  Organization         Address  Phone   Notes  [facility] [address], [place] [phone] Also accepts self-pay patients.  [facility] [address], [place]  [phone]   [place], [address], [place] [phone]   [facility] [address], [place] [phone]   Lucianne Lei [address], [place]   [phone] Only accepts [place] Access [place] patients after they have their  name applied to their card.   Self-Pay (no insurance) in [facility]:  Organization         Address  Phone   Notes  Sickle Cell Patients, [facility] Internal Medicine [place] [phone]   [facility] [place] [phone]   [facility] [place]  [place], [place], [place] [phone]   [facility]/Dr. Osei-Bonsu  [address] [place] or [address], [place] [phone] Phone number for both [place] and [place] locations is the same.  [facility] [address], [place] [phone]   [facility] [address], [place] or [address] [phone]   [facility] [address], [place] [phone], phone; [phone], fax Sees patients 1st and 3rd Saturday of every month.  Must not qualify for public or private insurance (i.e. Medicaid, Medicare, [place] Health Choice, Veterans' Benefits)  Household income should be no more than 200% of the poverty level The clinic cannot treat you if you are pregnant or think you are pregnant  Sexually transmitted diseases are not treated at the clinic.    Dental Care: Organization         Address  Phone  Notes  [facility] Department of [facility] [place] [phone] Accepts children up to age 55 who are enrolled in [place] or [place]; pregnant women with a Medicaid card; and children who have applied for Medicaid or [place] Health Choice, but were declined, whose parents can pay a reduced fee at time of service.  [facility] Department of [facility]  [address], [place] [phone] Accepts children up to age 68 who are enrolled in [place] or [place]; pregnant women with a Medicaid card; and children who have applied for  Medicaid or [place] Health Choice, but were declined, whose parents can pay a reduced fee at time of service.  [place] Adult Dental Access PROGRAM  [place] [phone] Patients are seen by appointment only. Walk-ins are not accepted. [place] will see patients 73 years of age and older. Monday - Tuesday (8am-5pm) Most Wednesdays (8:30-5pm) $30 per visit, cash  only  [facility] Adult Dental Access PROGRAM  [address], [facility] [phone] Patients are seen by appointment only. Walk-ins are not accepted. [place] will see patients 28 years of age and older. One Wednesday Evening (Monthly: Volunteer Based).  $30 per visit, cash only  [place]  [phone] for adults; Children under age 5, call Graduate Pediatric Dentistry at [phone]. Children aged 52-14, please call [phone] to request a pediatric application.  Dental services are provided in all areas of dental care including fillings, crowns and bridges, complete and partial dentures, implants, gum treatment, root canals, and extractions. Preventive care is also provided. Treatment is provided to both adults and children. Patients are selected via a lottery and there is often a waiting list.   [facility] [address] [place]  [phone] [email or web address]   [facility] [address] [place], [place] [phone], Ext. [phone] Second and Fourth Thursday of each month, opens at 6:30 AM; Clinic ends at 9 AM.  Patients are seen on a first-come first-served basis, and a limited number are seen during each clinic.   [facility]  [address] Hillard Danker [place], [place] [phone]   Eligibility Requirements You must have lived in [place], [place], or [place] counties for at least the last three months.   You cannot be eligible for state or federal sponsored [employer], including [employer], [place],  or [employer].   You generally cannot be eligible for healthcare insurance through your employer.    How to apply: Eligibility screenings are held every Tuesday and Wednesday afternoon from 1:00 pm until 4:00 pm. You do not need an appointment for the interview!  [facility] [address], [place], [place]   [place]  [place] Department  [place]  [phone]    Behavioral Health Resources in the Community: Intensive Outpatient Programs Organization         Address  Phone  Notes  [facility] [place] [address] [place], [place] [phone]   [facility] [address], [place], [place]   ADS: Alcohol & Drug Svcs [address] [place], [place]   [place] [address]  [place], [place] or [phone]   Substance Abuse Resources Organization         Address  Phone  Notes  Alcohol and Drug Services  [phone]   [place]  [phone]   [facility] [place]   Chinita Pester  [phone]   Residential & Outpatient Substance Abuse Program  [phone]   Psychological Services Organization         Address  Phone  Notes  [facility]  [place]  [phone]   [place] [address] [place] or [phone]    Mobile Crisis Teams Organization         Address  Phone  Notes  [facility], Mobile Crisis Care Unit  [phone]   [facility] Psychotherapeutic Services  [address] [place], [place]   [place] Levels [address], [place] [phone]    Self-Help/Support Groups Organization         Address  Phone  Notes  [facility]ental [facility]oc. of [place] - variety of support groups   [place] Call for more information  Narcotics Anonymous (NA), [facility] [address], [employer] [place]  2 meetings at this location   Special educational needs teacher         Address  Phone  Notes  ASAP Residential Treatment [place],    [place]  [phone]   [facility]  [address] [place] [address]021, [place], [place]   [place], [place] [phone] Admissions: 8am-3pm M-F  Incentives Substance [place] [address],    [place], [place] [phone]   [facility] [address] [place], [place], [place]   [facility] [address]  [place], [place]   [facility] Programs - Intensive Outpatient [address], [place], [place]   [facility] [place].,  [place], [place] [phone] or [phone]   [facility] Treatment Services (RTS) [address], [place], [place] Accepts Medicaid  [facility] [place] [address]  [place] [phone] Substance Abuse/Addiction Treatment   [facility] Organization         Address  Phone  Notes  [facility]  [phone]   Domenic Schwab, PhD [address] Arlis Porta [place], [place]   [phone] or [phone]   [place], [place] [phone]   [facility] [address] [place], [place] [phone] Insurance/Medicaid/sponsorship through [facility] and Families [address]                                    [place], [place] [phone] [place] [address], [place] [phone]    Dr. Adele Schilder  [phone]   [facility] Dept. 1) [address], [place] 2) [place] 3)  [place] [address], [place] [phone]  [phone]   [place] [phone] or [phone] (After Hours)

## 2013-07-27 ENCOUNTER — Emergency Department (HOSPITAL_COMMUNITY): Payer: Self-pay

## 2013-07-27 ENCOUNTER — Emergency Department (HOSPITAL_COMMUNITY)
Admission: EM | Admit: 2013-07-27 | Discharge: 2013-07-27 | Disposition: A | Discharge status: Home / Self Care | Payer: Self-pay | Attending: Emergency Medicine | Admitting: Emergency Medicine

## 2013-07-27 ENCOUNTER — Emergency Department (HOSPITAL_COMMUNITY): Payer: No Typology Code available for payment source

## 2013-07-27 ENCOUNTER — Encounter (HOSPITAL_COMMUNITY): Payer: Self-pay | Admitting: Emergency Medicine

## 2013-07-27 DIAGNOSIS — Z79899 Other long term (current) drug therapy: Secondary | ICD-10-CM | POA: Insufficient documentation

## 2013-07-27 DIAGNOSIS — N739 Female pelvic inflammatory disease, unspecified: Secondary | ICD-10-CM

## 2013-07-27 DIAGNOSIS — Z3202 Encounter for pregnancy test, result negative: Secondary | ICD-10-CM | POA: Insufficient documentation

## 2013-07-27 DIAGNOSIS — Z792 Long term (current) use of antibiotics: Secondary | ICD-10-CM | POA: Insufficient documentation

## 2013-07-27 DIAGNOSIS — M545 Low back pain, unspecified: Secondary | ICD-10-CM | POA: Insufficient documentation

## 2013-07-27 DIAGNOSIS — R339 Retention of urine, unspecified: Secondary | ICD-10-CM | POA: Insufficient documentation

## 2013-07-27 DIAGNOSIS — R109 Unspecified abdominal pain: Secondary | ICD-10-CM | POA: Insufficient documentation

## 2013-07-27 DIAGNOSIS — E119 Type 2 diabetes mellitus without complications: Secondary | ICD-10-CM | POA: Insufficient documentation

## 2013-07-27 DIAGNOSIS — R300 Dysuria: Secondary | ICD-10-CM | POA: Insufficient documentation

## 2013-07-27 DIAGNOSIS — E669 Obesity, unspecified: Secondary | ICD-10-CM | POA: Insufficient documentation

## 2013-07-27 DIAGNOSIS — Z8659 Personal history of other mental and behavioral disorders: Secondary | ICD-10-CM | POA: Insufficient documentation

## 2013-07-27 DIAGNOSIS — J45909 Unspecified asthma, uncomplicated: Secondary | ICD-10-CM | POA: Insufficient documentation

## 2013-07-27 DIAGNOSIS — G40909 Epilepsy, unspecified, not intractable, without status epilepticus: Secondary | ICD-10-CM | POA: Insufficient documentation

## 2013-07-27 DIAGNOSIS — IMO0002 Reserved for concepts with insufficient information to code with codable children: Secondary | ICD-10-CM | POA: Insufficient documentation

## 2013-07-27 DIAGNOSIS — I10 Essential (primary) hypertension: Secondary | ICD-10-CM | POA: Insufficient documentation

## 2013-07-27 DIAGNOSIS — R509 Fever, unspecified: Secondary | ICD-10-CM | POA: Insufficient documentation

## 2013-07-27 LAB — URINALYSIS, ROUTINE W REFLEX MICROSCOPIC
Bilirubin Urine: NEGATIVE
GLUCOSE, UA: NEGATIVE mg/dL
Hgb urine dipstick: NEGATIVE
Ketones, ur: NEGATIVE mg/dL
Leukocytes, UA: NEGATIVE
Nitrite: NEGATIVE
Protein, ur: NEGATIVE mg/dL
SPECIFIC GRAVITY, URINE: 1.016 (ref 1.005–1.030)
Urobilinogen, UA: 0.2 mg/dL (ref 0.0–1.0)
pH: 6.5 (ref 5.0–8.0)

## 2013-07-27 LAB — BASIC METABOLIC PANEL
BUN: 16 mg/dL (ref 6–23)
CHLORIDE: 104 meq/L (ref 96–112)
CO2: 25 meq/L (ref 19–32)
Calcium: 9.4 mg/dL (ref 8.4–10.5)
Creatinine, Ser: 0.71 mg/dL (ref 0.50–1.10)
GFR calc Af Amer: >90 mL/min (ref >90)
GFR calc non Af Amer: >90 mL/min (ref >90)
Glucose, Bld: 81 mg/dL (ref 70–99)
Potassium: 4 mEq/L (ref 3.7–5.3)
SODIUM: 142 meq/L (ref 137–147)

## 2013-07-27 LAB — WET PREP, GENITAL
Clue Cells Wet Prep HPF POC: NONE SEEN
Trich, Wet Prep: NONE SEEN
Yeast Wet Prep HPF POC: NONE SEEN

## 2013-07-27 LAB — CBC WITH DIFFERENTIAL/PLATELET
BASOS ABS: 0.1 10*3/uL (ref 0.0–0.1)
Basophils Relative: 1 % (ref 0–1)
Eosinophils Absolute: 0.1 10*3/uL (ref 0.0–0.7)
Eosinophils Relative: 2 % (ref 0–5)
HCT: 39.6 % (ref 36.0–46.0)
Hemoglobin: 13.8 g/dL (ref 12.0–15.0)
LYMPHS PCT: 34 % (ref 12–46)
Lymphs Abs: 2.2 10*3/uL (ref 0.7–4.0)
MCH: 30.1 pg (ref 26.0–34.0)
MCHC: 34.8 g/dL (ref 30.0–36.0)
MCV: 86.5 fL (ref 78.0–100.0)
Monocytes Absolute: 0.6 10*3/uL (ref 0.1–1.0)
Monocytes Relative: 8 % (ref 3–12)
Neutro Abs: 3.7 10*3/uL (ref 1.7–7.7)
Neutrophils Relative %: 55 % (ref 43–77)
PLATELETS: 219 10*3/uL (ref 150–400)
RBC: 4.58 MIL/uL (ref 3.87–5.11)
RDW: 12.9 % (ref 11.5–15.5)
WBC: 6.6 10*3/uL (ref 4.0–10.5)

## 2013-07-27 LAB — LACTIC ACID, PLASMA: Lactic Acid, Venous: 0.8 mmol/L (ref 0.5–2.2)

## 2013-07-27 LAB — PREGNANCY, URINE: PREG TEST UR: NEGATIVE

## 2013-07-27 MED ORDER — ONDANSETRON HCL 4 MG/2ML IJ SOLN
4.0000 mg | Freq: Once | INTRAMUSCULAR | Status: AC
  Administered 2013-07-27: 4 mg via INTRAVENOUS
  Filled 2013-07-27: qty 2

## 2013-07-27 MED ORDER — DEXTROSE 5 % IV SOLN
2.0000 g | Freq: Once | INTRAVENOUS | Status: AC
  Administered 2013-07-27: 2 g via INTRAVENOUS
  Filled 2013-07-27 (×2): qty 2

## 2013-07-27 MED ORDER — DOXYCYCLINE HYCLATE 100 MG IV SOLR
100.0000 mg | Freq: Once | INTRAVENOUS | Status: AC
  Administered 2013-07-27: 100 mg via INTRAVENOUS
  Filled 2013-07-27 (×2): qty 100

## 2013-07-27 MED ORDER — MORPHINE SULFATE 4 MG/ML IJ SOLN
4.0000 mg | Freq: Once | INTRAMUSCULAR | Status: AC
  Administered 2013-07-27: 4 mg via INTRAVENOUS
  Filled 2013-07-27: qty 1

## 2013-07-27 MED ORDER — GADOBENATE DIMEGLUMINE 529 MG/ML IV SOLN
20.0000 mL | Freq: Once | INTRAVENOUS | Status: AC | PRN
  Administered 2013-07-27: 20 mL via INTRAVENOUS

## 2013-07-27 MED ORDER — HYDROMORPHONE HCL PF 1 MG/ML IJ SOLN
1.0000 mg | Freq: Once | INTRAMUSCULAR | Status: AC
  Administered 2013-07-27: 1 mg via INTRAVENOUS
  Filled 2013-07-27: qty 1

## 2013-07-27 MED ORDER — IOHEXOL 300 MG/ML  SOLN
50.0000 mL | Freq: Once | INTRAMUSCULAR | Status: AC | PRN
  Administered 2013-07-27: 50 mL via ORAL

## 2013-07-27 MED ORDER — IOHEXOL 300 MG/ML  SOLN
100.0000 mL | Freq: Once | INTRAMUSCULAR | Status: AC | PRN
  Administered 2013-07-27: 100 mL via INTRAVENOUS

## 2013-07-27 MED ORDER — OXYCODONE-ACETAMINOPHEN 5-325 MG PO TABS: 2.0000 | ORAL_TABLET | ORAL | Status: DC | PRN

## 2013-07-27 MED ORDER — LORAZEPAM 2 MG/ML IJ SOLN
2.0000 mg | Freq: Once | INTRAMUSCULAR | Status: AC
  Administered 2013-07-27: 2 mg via INTRAVENOUS
  Filled 2013-07-27: qty 1

## 2013-07-27 MED ORDER — DOXYCYCLINE HYCLATE 100 MG PO CAPS: 100.0000 mg | ORAL_CAPSULE | Freq: Two times a day (BID) | ORAL | Status: DC

## 2013-07-27 NOTE — ED Provider Notes (Signed)
CSN: [id number]     Arrival date & time 07/27/13  1033 History   First MD Initiated Contact with Patient 07/27/13 1047     Chief Complaint  Patient presents with  . Fever  . Back Pain     (Consider location/radiation/quality/duration/timing/severity/associated sxs/prior Treatment) HPI Comments: Patient is a 38 year old female with a past medical history of bipolar disorder, schizophrenia, depression, seizures, diabetes, and hypertension who presents with abdominal pain that started 3 days ago. The pain is located in her lower abdomen and radiates to her bilateral lower back. The pain is described as aching and severe. The pain started gradually and progressively worsened since the onset. No alleviating/aggravating factors. The patient has tried her friend's Percocet for symptoms without relief. Associated symptoms include dyspareunia, dysuria and fever. Patient denies headache, NVD, chest pain, SOB, dysuria, constipation, abnormal vaginal bleeding/discharge. Patient reports not having a period in the past 7 years.     Patient is a 38 y.o. female presenting with fever and back pain.  Fever Associated symptoms: dysuria   Associated symptoms: no chest pain, no chills, no diarrhea, no nausea and no vomiting   Back Pain Associated symptoms: abdominal pain and dysuria   Associated symptoms: no chest pain, no fever and no weakness     Past Medical History  Diagnosis Date  . Bipolar 1 disorder   . PTSD (post-traumatic stress disorder)   . Schizophrenia   . Depression   . Seizures   . Hypertension   . Diabetes mellitus without complication   . Asthma   . Obesity    Past Surgical History  Procedure Laterality Date  . Cholecystectomy    . Cesarean section     No family history on file. History  Substance Use Topics  . Smoking status: Never Smoker   . Smokeless tobacco: Not on file  . Alcohol Use: Yes   OB History   Grav Para Term Preterm Abortions TAB SAB Ect Mult Living                  Review of Systems  Constitutional: Negative for fever, chills and fatigue.  HENT: Negative for trouble swallowing.   Eyes: Negative for visual disturbance.  Respiratory: Negative for shortness of breath.   Cardiovascular: Negative for chest pain and palpitations.  Gastrointestinal: Positive for abdominal pain. Negative for nausea, vomiting and diarrhea.  Genitourinary: Positive for dysuria and dyspareunia. Negative for difficulty urinating.  Musculoskeletal: Positive for back pain. Negative for arthralgias and neck pain.  Skin: Negative for color change.  Neurological: Negative for dizziness and weakness.  Psychiatric/Behavioral: Negative for dysphoric mood.      Allergies  Aspirin; Bee venom; Sulfa antibiotics; and Ultram  Home Medications   Prior to Admission medications   Medication Sig Start Date End Date Taking? Authorizing Jennifer Holland  cephALEXin (KEFLEX) 500 MG capsule Take 1 capsule (500 mg total) by mouth 4 (four) times daily. 07/13/13   Richarda Blade, MD  diphenhydrAMINE (BENADRYL) 25 mg capsule Take 100 mg by mouth daily as needed for itching or allergies.    Historical Delano Scardino, MD  ibuprofen (ADVIL,MOTRIN) 200 MG tablet Take 400 mg by mouth every 6 (six) hours as needed for mild pain.    Historical Kalee Mcclenathan, MD  naproxen sodium (ANAPROX) 220 MG tablet Take 1,320 mg by mouth 3 (three) times daily as needed (for pain).     Historical Vanellope Passmore, MD  ondansetron (ZOFRAN-ODT) 4 MG disintegrating tablet Take 4 mg by mouth every 8 (  eight) hours as needed for nausea or vomiting.    Historical Lianna Sitzmann, MD  oxyCODONE-acetaminophen (PERCOCET) 5-325 MG per tablet Take 2 tablets by mouth every 4 (four) hours as needed. 07/13/13   Richarda Blade, MD  phenytoin (DILANTIN) 100 MG ER capsule Take 3 capsules (300 mg total) by mouth 3 (three) times daily. 07/13/13   Richarda Blade, MD  trolamine salicylate (ASPERCREME) 10 % cream Apply 1 application topically as needed for muscle  pain.    Historical Sherrina Zaugg, MD   BP 155/131  Pulse 73  Temp(Src) 98.1 F (36.7 C) (Oral)  Resp 18  SpO2 99% Physical Exam  Nursing note and vitals reviewed. Constitutional: She is oriented to person, place, and time. She appears well-developed and well-nourished. No distress.  HENT:  Head: Normocephalic and atraumatic.  Eyes: Conjunctivae and EOM are normal.  Neck: Normal range of motion.  Cardiovascular: Normal rate and regular rhythm.  Exam reveals no gallop and no friction rub.   No murmur heard. Pulmonary/Chest: Effort normal and breath sounds normal. She has no wheezes. She has no rales. She exhibits no tenderness.  Abdominal: Soft. She exhibits no distension. There is tenderness. There is no rebound and no guarding.  Patient has a large pannus. Lower abdominal tenderness to palpation. No other focal tenderness to palpation.   Genitourinary: Vagina normal.  Moderate amount of thick, white discharge in vagina. Cervical os closed. Generalized tenderness to palpation on bimanual exam.   Musculoskeletal: Normal range of motion.  Neurological: She is alert and oriented to person, place, and time. Coordination normal.  Speech is goal-oriented. Moves limbs without ataxia.   Skin: Skin is warm and dry.  Psychiatric: She has a normal mood and affect. Her behavior is normal.    ED Course  Procedures (including critical care time) Labs Review Labs Reviewed  WET PREP, GENITAL - Abnormal; Notable for the following:    WBC, Wet Prep HPF POC FEW (*)    All other components within normal limits  URINE CULTURE  GC/CHLAMYDIA PROBE AMP  CULTURE, BLOOD (ROUTINE X 2)  CULTURE, BLOOD (ROUTINE X 2)  CBC WITH DIFFERENTIAL  BASIC METABOLIC PANEL  URINALYSIS, ROUTINE W REFLEX MICROSCOPIC  PREGNANCY, URINE  LACTIC ACID, PLASMA    Imaging Review Dg Chest 2 View  07/27/2013   CLINICAL DATA:  Chest pain  EXAM: CHEST  2 VIEW  COMPARISON:  None.  FINDINGS: Cardiomediastinal silhouette is  stable. No acute infiltrate or pleural effusion. No pulmonary edema. Bony thorax is unremarkable.  IMPRESSION: No active cardiopulmonary disease.   Electronically Signed   By: Lahoma Crocker M.D.   On: 07/27/2013 13:31   Mr Lumbar Spine W Wo Contrast  07/27/2013   CLINICAL DATA:  Low back pain.  Question abscess.  EXAM: MRI LUMBAR SPINE WITHOUT AND WITH CONTRAST  TECHNIQUE: Multiplanar and multiecho pulse sequences of the lumbar spine were obtained without and with intravenous contrast.  CONTRAST:  20 mL MULTIHANCE GADOBENATE DIMEGLUMINE 529 MG/ML IV SOLN  COMPARISON:  CT abdomen and pelvis 07/27/2013.  FINDINGS: Vertebral body height, signal and alignment are maintained. There is no abscess or evidence of discitis or osteomyelitis. The conus medullaris is normal in signal and position. Imaged intra-abdominal contents are unremarkable.  The T10-11, T11-12 and T12-L1 levels are imaged in the sagittal plane only and negative.  L1-2:  Negative.  L2-3:  Negative.  L3-4: Minimal disc bulge without central canal or foraminal narrowing.  L4-5: Shallow disc bulge eccentric to the left  with a tiny annular tear. The central canal and foramina are open.  L5-S1:  Negative.  IMPRESSION: Negative for abscess or discitis.  No acute abnormality is present.  Mild spondylosis without central canal or foraminal narrowing   Electronically Signed   By: Inge Rise M.D.   On: 07/27/2013 15:47   Ct Abdomen Pelvis W Contrast  07/27/2013   CLINICAL DATA:  Fever.  Pelvic discomfort.  Low back pain.  Dysuria.  EXAM: CT ABDOMEN AND PELVIS WITH CONTRAST  TECHNIQUE: Multidetector CT imaging of the abdomen and pelvis was performed using the standard protocol following bolus administration of intravenous contrast.  CONTRAST:  124mL OMNIPAQUE IOHEXOL 300 MG/ML  SOLN  COMPARISON:  Pelvic ultrasound of 04/30/2013  FINDINGS: Mild intrahepatic biliary dilatation noted with common bile duct at 9 mm. Gallbladder surgically absent.  The liver  appears otherwise unremarkable. The spleen, pancreas, adrenal glands, and kidneys appear normal.  Appendix normal. No pathologic upper abdominal adenopathy is observed. No ureteral or bladder calculus. No pathologic pelvic adenopathy is observed.  Uterus and adnexa unremarkable. No findings of pelvic floor laxity. No specific lesion is observed in the course of the visualized urethra or vagina. At the L4-5 level there is likely a small left paracentral calcified disc protrusion but I do not observe definite lumbar impingement.  IMPRESSION: 1. No specific findings to correlate with patient's pelvic pain. 2. Mild intrahepatic and extrahepatic biliary prominence is probably a physiologic response to cholecystectomy. 3. Suspected left paracentral calcified disc protrusion at L4-5 but without definite impingement.   Electronically Signed   By: Sherryl Barters M.D.   On: 07/27/2013 14:36     EKG Interpretation None      MDM   Final diagnoses:  Bilateral low back pain without sciatica  Urinary retention  Pelvic inflammatory disease    11:06 AM Labs and urinalysis pending. Patient will have pelvic exam and wet prep. Vitals stable and patient afebrile.   12:25 PM Pelvic exam done. Patient reports she is unable to urinate. During the in and out cath, patient had 500cc of urine. Patient discussed with Dr. Darl Householder who will see the patient. We will discuss MRI.   MRI and CT abdomen pelvis negative for acute findings. Patient will have a foley catheter placed and Urology follow up. Patient will be treated for PID. Patient instructed to return with worsening or concerning symptoms.     Alvina Chou, PA-C 07/28/13 0725

## 2013-07-27 NOTE — Progress Notes (Signed)
[facility] CL spoke with patient about [facility] [employer]. Patient has [employer] with pcp [id number]. CL explained program patient and provided her with contact information for [facility].

## 2013-07-27 NOTE — ED Notes (Signed)
Patient transported to CT 

## 2013-07-27 NOTE — Discharge Instructions (Signed)
Keep foley catheter in place and follow up with [facility]. Take Percocet as needed for pain. Take doxycycline as directed until gone for pelvic inflammatory disease. Refer to attached documents for more information.

## 2013-07-27 NOTE — ED Notes (Signed)
MD at bedside. 

## 2013-07-27 NOTE — ED Notes (Signed)
Per pt, states she has been treated for UTI for over 3 weeks, has been on antibiotics but still having symptoms-states now having lower back pain and fever-dysuria-

## 2013-07-27 NOTE — ED Notes (Addendum)
Requesting in and out

## 2013-07-28 LAB — URINE CULTURE
CULTURE: NO GROWTH
Colony Count: NO GROWTH
Special Requests: NORMAL

## 2013-07-28 LAB — GC/CHLAMYDIA PROBE AMP
CT PROBE, AMP APTIMA: NEGATIVE
GC Probe RNA: NEGATIVE

## 2013-07-28 NOTE — ED Provider Notes (Signed)
Medical screening examination/treatment/procedure(s) were conducted as a shared visit with non-physician practitioner(s) and myself.  I personally evaluated the patient during the encounter.   EKG Interpretation None      [name] [name] is a 38 y.o. female hx of bipolar, schizophrenia, DM here with low ab pain, flank pain, back pain fever. States symptoms for the last 3 days. Last fever was 101 this AM. Has some flank pain and dysuria. Denies vaginal discharge. Of note, she just moved to [facility] from [place] since January and has frequent ED visits. Has been treated for UTI recently. She also notes that she had trouble urinating today but no numbness or weakness in her legs. She states that she had PID in [place] requiring hospitalization. On exam, vital stable (slightly hypertensive). Afebrile, not tachy. Obese, well appearing. Lungs clear. Abdomen soft, obese, but mild diffuse tenderness, worse in lower aspect. Pelvic exam done by PA. ? Midline lumbar tenderness, mostly paralumbar area. ? Dec Rectal tone. No saddle anesthesia. Nl strength and sensation bilateral lower extremities. She wasn't able to urinate so she was cathed and 500 cc urine came out. Since she had retention, I was concerned for possible epidural abscess vs pyelo vs PID. MRI showed no abscess. Treated empirically for PID. CT showed no fluid collection. Able to ambulate and has no other neuro deficits. WBC nl, lactate nl. I am wondering if she has been exaggerating her symptoms to get attention as she still lives in a shelter. We placed foley and have her f/u with urology for urinary retention. She is d/c home with pain meds, doxy (to complete treatment for chlamydia).    Wandra Arthurs, MD 07/28/13 [phone]

## 2013-07-29 ENCOUNTER — Encounter (HOSPITAL_COMMUNITY): Payer: Self-pay | Admitting: Emergency Medicine

## 2013-07-29 ENCOUNTER — Emergency Department (HOSPITAL_COMMUNITY)
Admission: EM | Admit: 2013-07-29 | Discharge: 2013-07-30 | Disposition: A | Discharge status: Home / Self Care | Payer: No Typology Code available for payment source | Attending: Emergency Medicine | Admitting: Emergency Medicine

## 2013-07-29 DIAGNOSIS — R197 Diarrhea, unspecified: Secondary | ICD-10-CM | POA: Insufficient documentation

## 2013-07-29 DIAGNOSIS — Z79899 Other long term (current) drug therapy: Secondary | ICD-10-CM | POA: Insufficient documentation

## 2013-07-29 DIAGNOSIS — R300 Dysuria: Secondary | ICD-10-CM | POA: Insufficient documentation

## 2013-07-29 DIAGNOSIS — E669 Obesity, unspecified: Secondary | ICD-10-CM | POA: Insufficient documentation

## 2013-07-29 DIAGNOSIS — I10 Essential (primary) hypertension: Secondary | ICD-10-CM | POA: Insufficient documentation

## 2013-07-29 DIAGNOSIS — Z8659 Personal history of other mental and behavioral disorders: Secondary | ICD-10-CM | POA: Insufficient documentation

## 2013-07-29 DIAGNOSIS — Z792 Long term (current) use of antibiotics: Secondary | ICD-10-CM | POA: Insufficient documentation

## 2013-07-29 DIAGNOSIS — J45909 Unspecified asthma, uncomplicated: Secondary | ICD-10-CM | POA: Insufficient documentation

## 2013-07-29 DIAGNOSIS — M549 Dorsalgia, unspecified: Secondary | ICD-10-CM | POA: Insufficient documentation

## 2013-07-29 DIAGNOSIS — R109 Unspecified abdominal pain: Secondary | ICD-10-CM | POA: Insufficient documentation

## 2013-07-29 DIAGNOSIS — E119 Type 2 diabetes mellitus without complications: Secondary | ICD-10-CM | POA: Insufficient documentation

## 2013-07-29 DIAGNOSIS — R339 Retention of urine, unspecified: Secondary | ICD-10-CM | POA: Insufficient documentation

## 2013-07-29 LAB — POC OCCULT BLOOD, ED: FECAL OCCULT BLD: NEGATIVE

## 2013-07-29 NOTE — Discharge Instructions (Signed)
Abdominal Pain Many things can cause belly (abdominal) pain. Most times, the belly pain is not dangerous. Many cases of belly pain can be watched and treated at home. HOME CARE   Do not take medicines that help you go poop (laxatives) unless told to by your doctor.  Only take medicine as told by your doctor.  Eat or drink as told by your doctor. Your doctor will tell you if you should be on a special diet. GET HELP IF:  You do not know what is causing your belly pain.  You have belly pain while you are sick to your stomach (nauseous) or have runny poop (diarrhea).  You have pain while you pee or poop.  Your belly pain wakes you up at night.  You have belly pain that gets worse or better when you eat.  You have belly pain that gets worse when you eat fatty foods.  You have a fever. GET HELP RIGHT AWAY IF:   The pain does not go away within 2 hours.  You keep throwing up (vomiting).  The pain changes and is only in the right or left part of the belly.  You have bloody or tarry looking poop. MAKE SURE YOU:   Understand these instructions.  Will watch your condition.  Will get help right away if you are not doing well or get worse. Document Released: 07/03/2007 Document Revised: 01/19/2013 Document Reviewed: 09/23/2012 [facility] Patient Information 2015 [place], [place]. This information is not intended to replace advice given to you by your health care provider. Make sure you discuss any questions you have with your health care provider. Your stool test shows NO Blood  Your CT Scan form 3 days ago is normal  You do not have a urinary tract infection nor an pelvic infection  Please make an appointment with [facility] for primary care as well as urology for further evaluation of your dysuria

## 2013-07-29 NOTE — ED Notes (Signed)
Patient was seen and treated on 07/27/13 for urinary retention and PID. She did not have her prescriptions filled for doxycycline and oxycodone and is back complaining of the same symptoms.

## 2013-07-29 NOTE — ED Provider Notes (Signed)
CSN: [id number]     Arrival date & time 07/29/13  2108 History   First MD Initiated Contact with Patient 07/29/13 2123     This chart was scribed for non-physician practitioner, Junius Creamer, [place] working with Jasper Riling. Alvino Chapel, MD by Forrestine Him, ED Scribe. This patient was seen in room WTR6/WTR6 and the patient's care was started at 9:30 PM.   Chief Complaint  Patient presents with  . Abdominal Pain  . Back Pain   HPI  HPI Comments: [name] [name] is a 38 y.o. female with a PMHx of Bipolar disorder, Urinary retention, PTSD, HTN, DM who presents to the Emergency Department complaining of ongoing, constant, moderate abdominal pain x 1 week that is unchanged. She also mentions ongoing back pain since onset of initial symptoms. Pt was recently treated on 6/30 for urinary retention. She was given a prescriptions for Doxycycline and Oxycodone but did not get these prescriptions filled. States medications were too expensive to fill. She states she has a history of urinary retention; 3 episodes since beginning of 2015. Pt is requesting removal of her foley catheter today. No other concerns this visit.  Past Medical History  Diagnosis Date  . Bipolar 1 disorder   . PTSD (post-traumatic stress disorder)   . Schizophrenia   . Depression   . Seizures   . Hypertension   . Asthma   . Obesity   . Diabetes mellitus without complication     pt states hypoglycemic   Past Surgical History  Procedure Laterality Date  . Cholecystectomy    . Cesarean section     History reviewed. No pertinent family history. History  Substance Use Topics  . Smoking status: Never Smoker   . Smokeless tobacco: Not on file  . Alcohol Use: Yes   OB History   Grav Para Term Preterm Abortions TAB SAB Ect Mult Living                 Review of Systems  Constitutional: Negative for fever and chills.  Gastrointestinal: Positive for abdominal pain.  Genitourinary: Positive for difficulty urinating.   Musculoskeletal: Positive for back pain.      Allergies  Aspirin; Bee venom; Sulfa antibiotics; and Ultram  Home Medications   Prior to Admission medications   Medication Sig Start Date End Date Taking? Authorizing Provider  acetaminophen (TYLENOL) 500 MG tablet Take 1,000 mg by mouth every 6 (six) hours as needed (pain).    Historical Provider, MD  albuterol (PROVENTIL HFA;VENTOLIN HFA) 108 (90 BASE) MCG/ACT inhaler Inhale 2 puffs into the lungs every 6 (six) hours as needed for wheezing or shortness of breath (SOB).    Historical Provider, MD  diphenhydrAMINE (BENADRYL) 25 mg capsule Take 100 mg by mouth daily as needed for itching or allergies.    Historical Provider, MD  doxycycline (VIBRAMYCIN) 100 MG capsule Take 1 capsule (100 mg total) by mouth 2 (two) times daily. 07/27/13   Kaitlyn Szekalski, PA-C  ibuprofen (ADVIL,MOTRIN) 200 MG tablet Take 400 mg by mouth every 6 (six) hours as needed for mild pain.    Historical Provider, MD  levETIRAcetam (KEPPRA) 500 MG tablet Take 500 mg by mouth 2 (two) times daily.    Historical Provider, MD  naproxen sodium (ANAPROX) 220 MG tablet Take 1,320 mg by mouth 3 (three) times daily as needed (for pain).     Historical Provider, MD  nitrofurantoin, macrocrystal-monohydrate, (MACROBID) 100 MG capsule Take 100 mg by mouth daily. One week course   Started  6/18    Historical Provider, MD  oxyCODONE-acetaminophen (PERCOCET) 5-325 MG per tablet Take 2 tablets by mouth every 4 (four) hours as needed. 07/13/13   Richarda Blade, MD  oxyCODONE-acetaminophen (PERCOCET/ROXICET) 5-325 MG per tablet Take 2 tablets by mouth every 4 (four) hours as needed for moderate pain or severe pain. 07/27/13   Kaitlyn Szekalski, PA-C  trolamine salicylate (ASPERCREME) 10 % cream Apply 1 application topically as needed for muscle pain.    Historical Provider, MD   Triage Vitals: BP 115/83  Pulse 82  Temp(Src) 99 F (37.2 C) (Oral)  SpO2 98%   Physical Exam  ED Course   Procedures (including critical care time)  DIAGNOSTIC STUDIES: Oxygen Saturation is 98% on RA, Normal by my interpretation.    COORDINATION OF CARE: 11:58 PM-Discussed treatment plan with pt at bedside and pt agreed to plan.     Labs Review Labs Reviewed  OCCULT BLOOD X 1 CARD TO LAB, STOOL  URINALYSIS, ROUTINE W REFLEX MICROSCOPIC  POC OCCULT BLOOD, ED    Imaging Review No results found.   EKG Interpretation None      MDM  Cultures reviewed negative for CG Chlamydia, yeast, bacteria, trichomoniasis No antibiotic needed at this time Patient requesting Foley Catheter removal as she can not be seen by urology until late next week   Patinet now stating she had diarrhea for the past 3 weeks with bloody stools today  CT Scan of 6/30 shows no diverticular diease  Foley removed  patient unable to urinate will reinsert and have patient FU with urology  Final diagnoses:  Dysuria  Abdominal discomfort  Diarrhea in adult patient  Urinary retention       I personally performed the services described in this documentation, which was scribed in my presence. The recorded information has been reviewed and is accurate.    Garald Balding, NP 07/29/13 2242  Garald Balding, NP 07/29/13 2358

## 2013-07-30 ENCOUNTER — Telehealth (HOSPITAL_COMMUNITY): Payer: Self-pay

## 2013-07-30 LAB — URINALYSIS, ROUTINE W REFLEX MICROSCOPIC
BILIRUBIN URINE: NEGATIVE
Glucose, UA: NEGATIVE mg/dL
Hgb urine dipstick: NEGATIVE
KETONES UR: NEGATIVE mg/dL
Leukocytes, UA: NEGATIVE
NITRITE: NEGATIVE
Protein, ur: NEGATIVE mg/dL
SPECIFIC GRAVITY, URINE: 1.019 (ref 1.005–1.030)
UROBILINOGEN UA: 1 mg/dL (ref 0.0–1.0)
pH: 5.5 (ref 5.0–8.0)

## 2013-07-30 NOTE — ED Provider Notes (Signed)
Medical screening examination/treatment/procedure(s) were performed by non-physician practitioner and as supervising physician I was immediately available for consultation/collaboration.   EKG Interpretation None       Jasper Riling. Alvino Chapel, MD 07/30/13 5170

## 2013-07-30 NOTE — ED Notes (Signed)
Patient teaching at discharge: 1) how to change from standard drainage bag to leg bag 2) cleaning of catheter 3) when to use standard bag v/s leg bag 4) placement of standard drainage bag when sleeping  Patient given sandwich, applesauce, and drink at discharge. Patient requested cab voucher, advised patient we no longer offer cab vouchers, patient then requested bus pass. Patient was advised buses are not currently running and therefore we are unable to provide a bus pass at this time. Patient was reminded upon entrance to lobby if she has still been unable to arrange transportation home at [place], to ask for a bus pass at the registration window. Patient confirms understanding.

## 2013-08-02 ENCOUNTER — Encounter (HOSPITAL_COMMUNITY): Payer: Self-pay | Admitting: Emergency Medicine

## 2013-08-02 ENCOUNTER — Emergency Department (HOSPITAL_COMMUNITY)
Admission: EM | Admit: 2013-08-02 | Discharge: 2013-08-02 | Disposition: A | Discharge status: Home / Self Care | Payer: Medicaid - Out of State | Attending: Emergency Medicine | Admitting: Emergency Medicine

## 2013-08-02 ENCOUNTER — Emergency Department (HOSPITAL_COMMUNITY): Payer: Medicaid - Out of State

## 2013-08-02 DIAGNOSIS — I10 Essential (primary) hypertension: Secondary | ICD-10-CM | POA: Insufficient documentation

## 2013-08-02 DIAGNOSIS — N760 Acute vaginitis: Secondary | ICD-10-CM | POA: Insufficient documentation

## 2013-08-02 DIAGNOSIS — E119 Type 2 diabetes mellitus without complications: Secondary | ICD-10-CM | POA: Insufficient documentation

## 2013-08-02 DIAGNOSIS — F319 Bipolar disorder, unspecified: Secondary | ICD-10-CM | POA: Diagnosis not present

## 2013-08-02 DIAGNOSIS — Z3202 Encounter for pregnancy test, result negative: Secondary | ICD-10-CM | POA: Insufficient documentation

## 2013-08-02 DIAGNOSIS — G40909 Epilepsy, unspecified, not intractable, without status epilepticus: Secondary | ICD-10-CM | POA: Diagnosis not present

## 2013-08-02 DIAGNOSIS — E669 Obesity, unspecified: Secondary | ICD-10-CM | POA: Insufficient documentation

## 2013-08-02 DIAGNOSIS — Z79899 Other long term (current) drug therapy: Secondary | ICD-10-CM | POA: Diagnosis not present

## 2013-08-02 DIAGNOSIS — N898 Other specified noninflammatory disorders of vagina: Secondary | ICD-10-CM | POA: Diagnosis present

## 2013-08-02 DIAGNOSIS — Z8659 Personal history of other mental and behavioral disorders: Secondary | ICD-10-CM | POA: Insufficient documentation

## 2013-08-02 DIAGNOSIS — Z466 Encounter for fitting and adjustment of urinary device: Secondary | ICD-10-CM | POA: Diagnosis not present

## 2013-08-02 DIAGNOSIS — B9689 Other specified bacterial agents as the cause of diseases classified elsewhere: Secondary | ICD-10-CM | POA: Diagnosis not present

## 2013-08-02 DIAGNOSIS — A499 Bacterial infection, unspecified: Secondary | ICD-10-CM | POA: Diagnosis not present

## 2013-08-02 DIAGNOSIS — R109 Unspecified abdominal pain: Secondary | ICD-10-CM | POA: Diagnosis not present

## 2013-08-02 DIAGNOSIS — J45909 Unspecified asthma, uncomplicated: Secondary | ICD-10-CM | POA: Insufficient documentation

## 2013-08-02 LAB — BASIC METABOLIC PANEL
Anion gap: 15 (ref 5–15)
BUN: 17 mg/dL (ref 6–23)
CO2: 20 mEq/L (ref 19–32)
Calcium: 9.1 mg/dL (ref 8.4–10.5)
Chloride: 106 mEq/L (ref 96–112)
Creatinine, Ser: 0.53 mg/dL (ref 0.50–1.10)
GFR calc non Af Amer: >90 mL/min (ref >90)
Glucose, Bld: 84 mg/dL (ref 70–99)
POTASSIUM: 4.5 meq/L (ref 3.7–5.3)
SODIUM: 141 meq/L (ref 137–147)

## 2013-08-02 LAB — CBC WITH DIFFERENTIAL/PLATELET
Basophils Absolute: 0 10*3/uL (ref 0.0–0.1)
Basophils Relative: 0 % (ref 0–1)
Eosinophils Absolute: 0.2 10*3/uL (ref 0.0–0.7)
Eosinophils Relative: 3 % (ref 0–5)
HCT: 37 % (ref 36.0–46.0)
Hemoglobin: 12.6 g/dL (ref 12.0–15.0)
Lymphocytes Relative: 26 % (ref 12–46)
Lymphs Abs: 2 10*3/uL (ref 0.7–4.0)
MCH: 29.9 pg (ref 26.0–34.0)
MCHC: 34.1 g/dL (ref 30.0–36.0)
MCV: 87.9 fL (ref 78.0–100.0)
Monocytes Absolute: 0.5 10*3/uL (ref 0.1–1.0)
Monocytes Relative: 6 % (ref 3–12)
Neutro Abs: 5 10*3/uL (ref 1.7–7.7)
Neutrophils Relative %: 65 % (ref 43–77)
PLATELETS: 188 10*3/uL (ref 150–400)
RBC: 4.21 MIL/uL (ref 3.87–5.11)
RDW: 13.3 % (ref 11.5–15.5)
WBC: 7.8 10*3/uL (ref 4.0–10.5)

## 2013-08-02 LAB — URINALYSIS, ROUTINE W REFLEX MICROSCOPIC
Bilirubin Urine: NEGATIVE
Glucose, UA: NEGATIVE mg/dL
Ketones, ur: NEGATIVE mg/dL
Nitrite: NEGATIVE
PROTEIN: NEGATIVE mg/dL
Specific Gravity, Urine: 1.014 (ref 1.005–1.030)
UROBILINOGEN UA: 0.2 mg/dL (ref 0.0–1.0)
pH: 6 (ref 5.0–8.0)

## 2013-08-02 LAB — WET PREP, GENITAL
Trich, Wet Prep: NONE SEEN
Yeast Wet Prep HPF POC: NONE SEEN

## 2013-08-02 LAB — URINE MICROSCOPIC-ADD ON

## 2013-08-02 LAB — CULTURE, BLOOD (ROUTINE X 2)
Culture: NO GROWTH
Culture: NO GROWTH

## 2013-08-02 LAB — PREGNANCY, URINE: PREG TEST UR: NEGATIVE

## 2013-08-02 MED ORDER — HYDROCODONE-ACETAMINOPHEN 5-325 MG PO TABS
2.0000 | ORAL_TABLET | Freq: Once | ORAL | Status: AC
  Administered 2013-08-02: 2 via ORAL
  Filled 2013-08-02: qty 2

## 2013-08-02 MED ORDER — METRONIDAZOLE 500 MG PO TABS: 500.0000 mg | ORAL_TABLET | Freq: Two times a day (BID) | ORAL | Status: DC

## 2013-08-02 NOTE — ED Notes (Signed)
Pt c/o vaginal pain and discharge. sts she has a urinary catheter placed last tuesday after she got a UTI and kidney stone because she couldn't urinate on her own, denies being admitted to the hospital. sts she is taking antibiotics x 1 month, sts she is finished taking them. sts she was d/c from ED and told to make a follow up appointment, pt sts they can't see her for 2 weeks and she wants her catheter taken out and wants to have her vaginal d/c looked at. Pt sts she is sexually active, doesn't use condom, sts she was tested for STDs on Tuesday and told she didn't have anything, denies having vaginal d/c then. sts she was having vaginal pain then. Nad, skin warm and dry, resp e/u.

## 2013-08-02 NOTE — ED Notes (Signed)
Tried to get blood pt pulled arm away told RN to have Phlebotomy to come get it.

## 2013-08-02 NOTE — ED Notes (Signed)
Per pt she had catheter placed a few weeks ago and wants it out. sts it is causing her pain and she has been having some vaginal discharge. sts lower back pain.

## 2013-08-02 NOTE — ED Provider Notes (Signed)
CSN: [id number]     Arrival date & time 08/02/13  1449 History   First MD Initiated Contact with Patient 08/02/13 1746     Chief Complaint  Patient presents with  . Vaginal Discharge  . Urinary Retention     (Consider location/radiation/quality/duration/timing/severity/associated sxs/prior Treatment) The history is provided by the patient and medical records.    Patient with hx bipolar disorder, PTSD, DM, HTN, urinary retention presents with pain from indwelling foley catheter and abnormal vaginal discharge.  Pt has had several episodes of urinary retention since the beginning of this year, has not yet followed up with urology.  Pt was seen 07/27/13 and had pelvic, CT abd/pelvis, MR L-spine done, dx PID but pt did not fill doxycyline, d/c with indwelling foley catheter for urinary retention. Returned 07/29/13, foley catheter removed but replaced when patient could not urinate.  Pt presents today c/o continued pain from foley - states she has not followed up with urologist because of changing address and no phone service, etc..  Vaginal discharge is pasty and sticky.  She has not been sexually active since foley placed.  Has had constant lower abdominal pain that is unchanged x 2 weeks, crampy, throbbing, sharp, constant.  Also with chronic low back pain that is unchanged.  Pt developed chest pain while in ED - it comes on quickly then tapers over over minutes while sitting on stretcher.  Pt does have active URI with cough productive of green sputum.   Past Medical History  Diagnosis Date  . Bipolar 1 disorder   . PTSD (post-traumatic stress disorder)   . Schizophrenia   . Depression   . Seizures   . Hypertension   . Asthma   . Obesity   . Diabetes mellitus without complication     pt states hypoglycemic   Past Surgical History  Procedure Laterality Date  . Cholecystectomy    . Cesarean section     History reviewed. No pertinent family history. History  Substance Use Topics  . Smoking  status: Never Smoker   . Smokeless tobacco: Not on file  . Alcohol Use: Yes   OB History   Grav Para Term Preterm Abortions TAB SAB Ect Mult Living                 Review of Systems  All other systems reviewed and are negative.     Allergies  Aspirin; Bee venom; Sulfa antibiotics; and Ultram  Home Medications   Prior to Admission medications   Medication Sig Start Date End Date Taking? Authorizing Provider  acetaminophen (TYLENOL) 500 MG tablet Take 1,000 mg by mouth every 6 (six) hours as needed (pain).   Yes Historical Provider, MD  albuterol (PROVENTIL HFA;VENTOLIN HFA) 108 (90 BASE) MCG/ACT inhaler Inhale 2 puffs into the lungs every 6 (six) hours as needed for wheezing or shortness of breath (SOB).   Yes Historical Provider, MD  diphenhydrAMINE (BENADRYL) 25 mg capsule Take 100 mg by mouth daily as needed for itching or allergies.   Yes Historical Provider, MD  ibuprofen (ADVIL,MOTRIN) 200 MG tablet Take 400 mg by mouth every 6 (six) hours as needed for mild pain.   Yes Historical Provider, MD  levETIRAcetam (KEPPRA) 500 MG tablet Take 500 mg by mouth 2 (two) times daily.   Yes Historical Provider, MD  naproxen sodium (ANAPROX) 220 MG tablet Take 1,320 mg by mouth 3 (three) times daily as needed (for pain).    Yes Historical Provider, MD  oxyCODONE-acetaminophen (PERCOCET/ROXICET) 5-325  MG per tablet Take 2 tablets by mouth every 4 (four) hours as needed for moderate pain or severe pain. 07/27/13  Yes Kaitlyn Szekalski, PA-C  trolamine salicylate (ASPERCREME) 10 % cream Apply 1 application topically as needed for muscle pain.   Yes Historical Provider, MD   BP 114/86  Pulse 65  Temp(Src) 98.5 F (36.9 C) (Oral)  Resp 16  Ht 5\' 2"  (1.575 m)  SpO2 95% Physical Exam  Nursing note and vitals reviewed. Constitutional: She appears well-developed and well-nourished. No distress.  HENT:  Head: Normocephalic and atraumatic.  Neck: Neck supple.  Cardiovascular: Normal rate and  regular rhythm.   Pulmonary/Chest: Effort normal and breath sounds normal. No respiratory distress. She has no wheezes. She has no rales.  Abdominal: Soft. She exhibits no distension. There is tenderness. There is no rebound and no guarding.  Obese.  Diffuse lower abdominal tenderness  Genitourinary: Uterus is tender. Cervix exhibits motion tenderness. Right adnexum displays tenderness. Left adnexum displays tenderness. No erythema, tenderness or bleeding around the vagina. No foreign body around the vagina. No signs of injury around the vagina. Vaginal discharge found.  Exam limited due to pt body habitus.  Malodorous vaginal discharge  Neurological: She is alert.  Skin: She is not diaphoretic.    ED Course  Procedures (including critical care time) Labs Review Labs Reviewed  WET PREP, GENITAL - Abnormal; Notable for the following:    Clue Cells Wet Prep HPF POC FEW (*)    WBC, Wet Prep HPF POC FEW (*)    All other components within normal limits  URINALYSIS, ROUTINE W REFLEX MICROSCOPIC - Abnormal; Notable for the following:    APPearance CLOUDY (*)    Hgb urine dipstick TRACE (*)    Leukocytes, UA LARGE (*)    All other components within normal limits  URINE MICROSCOPIC-ADD ON - Abnormal; Notable for the following:    Squamous Epithelial / LPF MANY (*)    Bacteria, UA FEW (*)    All other components within normal limits  URINE CULTURE  CBC WITH DIFFERENTIAL  BASIC METABOLIC PANEL  PREGNANCY, URINE    Imaging Review No results found.   EKG Interpretation None      MDM   Final diagnoses:  Encounter for Foley catheter removal  Bacterial vaginosis    Pt with 18 ED visits in the past 6 months and 3 in the past 8 days for similar complaints.  Pt with lower abdominal pain that is unchanged since CT abd/pelvis.  Also with abnormal vaginal discharge.  Wet prep shows clue cells. Pt has tender cervix but Prior GC/Chlam negative and pt has not been sexually active since that  collection.  Pt with intermittent urinary retention - truly needs to follow up with urology but has been unable - states she will be able to now - requested d/c foley and attempt to urinate in ED - pt was able to urinate.  Appears to be contaminated specimen, will send for culture. Workup otherwise unremarkable.  Gyn, urology follow up. Discussed result, findings, treatment, and follow up  with patient.  Pt given return precautions.  Pt verbalizes understanding and agrees with plan.       [place], PA-C 08/03/13 0022

## 2013-08-02 NOTE — Discharge Instructions (Signed)
Read the information below.  Use the prescribed medication as directed.  Please discuss all new medications with your pharmacist.  You may return to the Emergency Department at any time for worsening condition or any new symptoms that concern you.  If you develop high fevers, worsening abdominal pain, uncontrolled vomiting, or are unable to tolerate fluids by mouth, return to the ER for a recheck.      Bacterial Vaginosis Bacterial vaginosis is a vaginal infection that occurs when the normal balance of bacteria in the vagina is disrupted. It results from an overgrowth of certain bacteria. This is the most common vaginal infection in women of childbearing age. Treatment is important to prevent complications, especially in pregnant women, as it can cause a premature delivery. CAUSES  Bacterial vaginosis is caused by an increase in harmful bacteria that are normally present in smaller amounts in the vagina. Several different kinds of bacteria can cause bacterial vaginosis. However, the reason that the condition develops is not fully understood. RISK FACTORS Certain activities or behaviors can put you at an increased risk of developing bacterial vaginosis, including:  Having a new sex partner or multiple sex partners.  Douching.  Using an intrauterine device (IUD) for contraception. Women do not get bacterial vaginosis from toilet seats, bedding, swimming pools, or contact with objects around them. SIGNS AND SYMPTOMS  Some women with bacterial vaginosis have no signs or symptoms. Common symptoms include:  Grey vaginal discharge.  A fishlike odor with discharge, especially after sexual intercourse.  Itching or burning of the vagina and vulva.  Burning or pain with urination. DIAGNOSIS  Your health care provider will take a medical history and examine the vagina for signs of bacterial vaginosis. A sample of vaginal fluid may be taken. Your health care provider will look at this sample under a  microscope to check for bacteria and abnormal cells. A vaginal pH test may also be done.  TREATMENT  Bacterial vaginosis may be treated with antibiotic medicines. These may be given in the form of a pill or a vaginal cream. A second round of antibiotics may be prescribed if the condition comes back after treatment.  HOME CARE INSTRUCTIONS   Only take over-the-counter or prescription medicines as directed by your health care provider.  If antibiotic medicine was prescribed, take it as directed. Make sure you finish it even if you start to feel better.  Do not have sex until treatment is completed.  Tell all sexual partners that you have a vaginal infection. They should see their health care provider and be treated if they have problems, such as a mild rash or itching.  Practice safe sex by using condoms and only having one sex partner. SEEK MEDICAL CARE IF:   Your symptoms are not improving after 3 days of treatment.  You have increased discharge or pain.  You have a fever. MAKE SURE YOU:   Understand these instructions.  Will watch your condition.  Will get help right away if you are not doing well or get worse. FOR MORE INFORMATION  Centers for Disease Control and Prevention, Division of STD Prevention: AppraiserFraud.fi American Sexual Health Association (ASHA): www.ashastd.org  Document Released: 01/14/2005 Document Revised: 11/04/2012 Document Reviewed: 08/26/2012 [facility] Patient Information 2015 [place], [place]. This information is not intended to replace advice given to you by your health care provider. Make sure you discuss any questions you have with your health care provider.   Emergency Department Resource Guide 1) Find a Doctor and Pay Out  of Pocket Although you won't have to find out who is covered by your insurance plan, it is a good idea to ask around and get recommendations. You will then need to call the office and see if the doctor you have chosen will accept you  as a new patient and what types of options they offer for patients who are self-pay. Some doctors offer discounts or will set up payment plans for their patients who do not have insurance, but you will need to ask so you aren't surprised when you get to your appointment.  2) Contact Your Local Health Department Not all health departments have doctors that can see patients for sick visits, but many do, so it is worth a call to see if yours does. If you don't know where your local health department is, you can check in your phone book. The CDC also has a tool to help you locate your state's health department, and many state websites also have listings of all of their local health departments.  3) Find a [facility] If your illness is not likely to be very severe or complicated, you may want to try a walk in clinic. These are popping up all over the country in pharmacies, drugstores, and shopping centers. They're usually staffed by nurse practitioners or physician assistants that have been trained to treat common illnesses and complaints. They're usually fairly quick and inexpensive. However, if you have serious medical issues or chronic medical problems, these are probably not your best option.  No Primary Care Doctor: - Call Health Connect at  [phone] - they can help you locate a primary care doctor that  accepts your insurance, provides certain services, etc. - Physician Referral Service- [phone]  Chronic Pain Problems: Organization         Address  Phone   Notes  [facility]  [phone] Patients need to be referred by their primary care doctor.   Medication Assistance: Organization         Address  Phone   Notes  [facility] Medication [facility] [address] [place], [place] [address] [phone] --Must be a resident of [facility] -- Must have NO insurance coverage whatsoever (no Medicaid/ Medicare, etc.) -- The pt. MUST have  a primary care doctor that directs their care regularly and follows them in the community   MedAssist  [phone]   [employer]  [phone]    Agencies that provide inexpensive medical care: Organization         Address  Phone   Notes  [place]  [phone]   [facility]    [phone]   [facility] [place], [place] [address] [phone]   [place] [address] [place] [phone]   Planned Parenthood    [phone]   [facility]    [phone]   [place] and [place] [address], [place] Phone:  [phone], Fax:  [phone] Hours of Operation:  9 am - 6 pm, M-F.  Also accepts Medicaid/Medicare and self-pay.  [facility] for [address] [address], [address], [place] Phone: [phone], Fax: [phone]. Hours of Operation:  8:30 am - 5:30 pm, M-F.  Also accepts Medicaid and self-pay.  [facility] [address], [employer] Phone: [phone]   [facility]  [place], [place], [place] [phone], Ext. [phone] Mondays & Thursdays: 7-9 AM.  First 15 patients are seen on a first come, first serve basis.    [place] Providers:  Organization         Address  Phone   Notes  [facility] [address], [place] [phone] Also accepts self-pay patients.  [facility] [address], [place]  [phone]   [place], [address], [place] [phone]   [facility] [address] [place] [phone]   Lucianne Lei [address], [place]   [phone] Only accepts [place] Access [place] patients after they have their name applied to their card.   Self-Pay (no insurance) in [facility]:  Organization         Address  Phone   Notes  Sickle Cell Patients, [facility] [place] [phone]   [facility] [place] [phone]   [facility] [place]  [place], [place], [place] [phone]   [facility]/Dr. Osei-Bonsu  [address] [place] or [address], [place] [phone] Phone number for both [place] and [place] locations is the same.  Urgent Medical and [facility] [address], [place] [phone]   [facility] [address], [place] or [address] [phone]   [facility] [address] [place] [phone], phone; [phone], fax Sees patients 1st and 3rd Saturday of every month.  Must not qualify for public or private insurance (i.e. Medicaid, Medicare, [place] Health Choice, Veterans' Benefits)  Household income should be no more than 200% of the poverty level The clinic cannot treat you if you are pregnant or think you are pregnant  Sexually transmitted diseases are not treated at the clinic.    Dental Care: Organization         Address  Phone  Notes  [facility] [phone] Accepts children up to age 2 who are enrolled in [place] or [place]; pregnant women with a Medicaid card; and children who have applied for Medicaid or [place] Health Choice, but were declined, whose parents can pay a reduced fee at time of service.  [facility] Department of [facility]  [address], [place] [phone] Accepts children up to age 9 who are enrolled in [place] or [place]; pregnant women with a Medicaid card; and children who have applied for Medicaid or  Health Choice, but were declined, whose parents can  pay a reduced fee at time of service.  [facility] Adult Dental Access PROGRAM  [place] [phone] Patients are seen by appointment only. Walk-ins are not accepted. [place] will see patients 60 years of age and older. Monday - Tuesday (8am-5pm) Most Wednesdays (8:30-5pm) $30 per visit, cash only  [facility] Adult Dental Access PROGRAM  [address], [facility] [phone] Patients are seen by appointment only. Walk-ins are not accepted. [place] will see patients 65 years of age and older. One Wednesday Evening (Monthly: Volunteer Based).  $30 per visit, cash  only  [place]  [phone] for adults; Children under age 53, call Graduate Pediatric Dentistry at [phone]. Children aged 70-14, please call [phone] to request a pediatric application.  Dental services are provided in all areas of dental care including fillings, crowns and bridges, complete and partial dentures, implants, gum treatment, root canals, and extractions. Preventive care is also provided. Treatment is provided to both adults and children. Patients are selected via a lottery and there is often a waiting list.   [facility] [address] [place]  [phone] www.drcivils.com   [facility] [address] [place], [place] [phone], Ext. [phone] Second and Fourth Thursday of each month, opens at 6:30 AM; Clinic ends at 9 AM.  Patients are seen on a first-come first-served basis, and a limited number are seen during each clinic.   [facility]  [address] Hillard Danker [place], [place] [phone]   Eligibility Requirements You must have lived in [place], [place], or [place] counties for at least the last three months.   You cannot be eligible for state or federal sponsored [employer], including [employer], [place], or [employer].   You generally cannot be eligible for healthcare  insurance through your employer.    How to apply: Eligibility screenings are held every Tuesday and Wednesday afternoon from 1:00 pm until 4:00 pm. You do not need an appointment for the interview!  [facility] [address] [place], [place]   [place]  [place] Department  [place]  [phone]    Behavioral Health Resources in the Community: Intensive Outpatient Programs Organization         Address  Phone  Notes  [place]. [address] [place], [place] [phone]   [facility] [address], [place], [place]   ADS: Alcohol & Drug Svcs [address], [place], [place]   [place] [address],  [place], [place] or [phone]   Substance Abuse Resources Organization         Address  Phone  Notes  Alcohol and Drug Services  [phone]   [place]  [phone]   [facility] [place]   Chinita Pester  [phone]   Residential & Outpatient Substance Abuse Program  [phone]   Psychological Services Organization         Address  Phone  Notes  [facility]  [place]  [phone]   [place] [address] [place] or [phone]    Mobile Crisis Teams Organization         Address  Phone  Notes  [facility], Mobile Crisis Care Unit  [phone]   [facility] Psychotherapeutic Services  [address] [place], [place]   [place] Levels [address], [place] [phone]    Self-Help/Support Groups Organization         Address  Phone             Notes  [place]. of [place] - variety of support groups  [place] Call for more information  Narcotics Anonymous (NA),  [facility] [address], [employer] [place]  2 meetings at this location   Special educational needs teacher  Address  Phone  Notes  [facility] [address]    [place]  [phone]   [facility]  [address] [place] [phone], [place], [place]   [place], [place] [phone] Admissions: 8am-3pm M-F  Incentives Substance [place] [address]    [place], [place] [phone]   [facility] [address] [place], [place], [place]   [facility] [address]  [place], [place]   [facility] Programs - Intensive Outpatient [address], [place], [place]   [facility] [address].,  [place], [place] [phone] or [phone]   [facility]idential Treatment Services (RTS) [address] [place], [place] Accepts Medicaid  [facility] [place] [address],  [place] [phone] Substance Abuse/Addiction Treatment   [facility] Organization         Address  Phone  Notes  [facility]  [phone]   Domenic Schwab, PhD [address] Arlis Porta [place], [place]   [phone] or [phone]   [place], [place] [phone]   [facility] [address] [place], [place] [phone] Insurance/Medicaid/sponsorship through [facility] and Families [address] [place]                                    [place], [place] [phone] [place] [address], [place] [phone]    Dr. Adele Schilder  [phone]   [facility] Dept. 1) [address] [place] 2) [place] 3)  [place] [address], [place] [phone]  [phone]    [place] [phone] or [phone] (After Hours)

## 2013-08-03 ENCOUNTER — Ambulatory Visit (HOSPITAL_COMMUNITY)
Admission: EM | Admit: 2013-08-03 | Discharge: 2013-08-03 | Disposition: A | Discharge status: Home / Self Care | Payer: Medicaid - Out of State | Attending: Emergency Medicine | Admitting: Emergency Medicine

## 2013-08-03 DIAGNOSIS — N739 Female pelvic inflammatory disease, unspecified: Secondary | ICD-10-CM | POA: Insufficient documentation

## 2013-08-03 DIAGNOSIS — R339 Retention of urine, unspecified: Secondary | ICD-10-CM | POA: Insufficient documentation

## 2013-08-03 DIAGNOSIS — M545 Low back pain, unspecified: Secondary | ICD-10-CM | POA: Diagnosis not present

## 2013-08-03 LAB — URINE CULTURE: Colony Count: 85000

## 2013-08-03 NOTE — ED Provider Notes (Signed)
Medical screening examination/treatment/procedure(s) were performed by non-physician practitioner and as supervising physician I was immediately available for consultation/collaboration.    Dorie Rank, MD 08/03/13 [phone]

## 2013-08-03 NOTE — Progress Notes (Signed)
  CARE MANAGEMENT ED NOTE 08/03/2013  Patient:  [name],[name]   Account Number:  [id number]  Date Initiated:  08/03/2013  Documentation initiated by:  Edwyna Shell  Subjective/Objective Assessment:   CM received phone call regarding discharge medications     Subjective/Objective Assessment Detail:     Action/Plan:   [place] letter was faxed to the pharmacy for $3 copay for antibiotic   Action/Plan Detail:   Anticipated DC Date:       Status Recommendation to Physician:   Result of Recommendation:  Agreed    [place]  [facility] Program    Choice offered to / List presented to:            Status of service:  Completed, signed off  ED Comments:   ED Comments Detail:  CM received phone call from [facility] pharmacist, Berneta Sages, and he stated that the discharge prescription for Flagyl costs $15.00 and the patient is stating that she is unable to afford the prescription. The pharmacists was requesting a change in the antibiotic to Keflex but this CM discussed the diagnosis in regards to the prescribed medication and agreed that the change would not be appropriate. This CM discussed the [facility] program with the pharmacist and that the patient could only use 1 time in a year. He then spoke with the patient and she agreed to use the [facility] program. This CM then faxed the [facility] letter to [phone]. No other questions or concerns at this time.

## 2013-08-04 LAB — GC/CHLAMYDIA PROBE AMP
CT PROBE, AMP APTIMA: NEGATIVE
GC PROBE AMP APTIMA: NEGATIVE

## 2013-08-23 ENCOUNTER — Emergency Department (HOSPITAL_COMMUNITY)
Admission: EM | Admit: 2013-08-23 | Discharge: 2013-08-23 | Disposition: A | Discharge status: Home / Self Care | Payer: No Typology Code available for payment source | Attending: Emergency Medicine | Admitting: Emergency Medicine

## 2013-08-23 ENCOUNTER — Encounter (HOSPITAL_COMMUNITY): Payer: Self-pay | Admitting: Emergency Medicine

## 2013-08-23 DIAGNOSIS — E119 Type 2 diabetes mellitus without complications: Secondary | ICD-10-CM | POA: Insufficient documentation

## 2013-08-23 DIAGNOSIS — I10 Essential (primary) hypertension: Secondary | ICD-10-CM | POA: Insufficient documentation

## 2013-08-23 DIAGNOSIS — A499 Bacterial infection, unspecified: Secondary | ICD-10-CM | POA: Insufficient documentation

## 2013-08-23 DIAGNOSIS — G40909 Epilepsy, unspecified, not intractable, without status epilepticus: Secondary | ICD-10-CM | POA: Insufficient documentation

## 2013-08-23 DIAGNOSIS — N898 Other specified noninflammatory disorders of vagina: Secondary | ICD-10-CM | POA: Insufficient documentation

## 2013-08-23 DIAGNOSIS — B9689 Other specified bacterial agents as the cause of diseases classified elsewhere: Secondary | ICD-10-CM | POA: Insufficient documentation

## 2013-08-23 DIAGNOSIS — E669 Obesity, unspecified: Secondary | ICD-10-CM | POA: Insufficient documentation

## 2013-08-23 DIAGNOSIS — Z79899 Other long term (current) drug therapy: Secondary | ICD-10-CM | POA: Insufficient documentation

## 2013-08-23 DIAGNOSIS — N3000 Acute cystitis without hematuria: Secondary | ICD-10-CM

## 2013-08-23 DIAGNOSIS — Z3202 Encounter for pregnancy test, result negative: Secondary | ICD-10-CM | POA: Insufficient documentation

## 2013-08-23 DIAGNOSIS — IMO0002 Reserved for concepts with insufficient information to code with codable children: Secondary | ICD-10-CM

## 2013-08-23 DIAGNOSIS — Z8659 Personal history of other mental and behavioral disorders: Secondary | ICD-10-CM | POA: Insufficient documentation

## 2013-08-23 DIAGNOSIS — T7421XA Adult sexual abuse, confirmed, initial encounter: Secondary | ICD-10-CM | POA: Insufficient documentation

## 2013-08-23 DIAGNOSIS — J45909 Unspecified asthma, uncomplicated: Secondary | ICD-10-CM | POA: Insufficient documentation

## 2013-08-23 DIAGNOSIS — N760 Acute vaginitis: Secondary | ICD-10-CM | POA: Insufficient documentation

## 2013-08-23 DIAGNOSIS — F319 Bipolar disorder, unspecified: Secondary | ICD-10-CM | POA: Insufficient documentation

## 2013-08-23 LAB — WET PREP, GENITAL
Trich, Wet Prep: NONE SEEN
Yeast Wet Prep HPF POC: NONE SEEN

## 2013-08-23 LAB — PREGNANCY, URINE: Preg Test, Ur: NEGATIVE

## 2013-08-23 LAB — URINALYSIS, ROUTINE W REFLEX MICROSCOPIC
Bilirubin Urine: NEGATIVE
Glucose, UA: NEGATIVE mg/dL
Ketones, ur: NEGATIVE mg/dL
NITRITE: POSITIVE — AB
Protein, ur: 100 mg/dL — AB
SPECIFIC GRAVITY, URINE: 1.016 (ref 1.005–1.030)
UROBILINOGEN UA: 1 mg/dL (ref 0.0–1.0)
pH: 6 (ref 5.0–8.0)

## 2013-08-23 LAB — URINE MICROSCOPIC-ADD ON

## 2013-08-23 LAB — HIV ANTIBODY (ROUTINE TESTING W REFLEX): HIV 1&2 Ab, 4th Generation: NONREACTIVE

## 2013-08-23 LAB — RPR

## 2013-08-23 MED ORDER — ACETAMINOPHEN 500 MG PO TABS: 1000.0000 mg | ORAL_TABLET | Freq: Once | ORAL | Status: DC

## 2013-08-23 MED ORDER — LIDOCAINE HCL (PF) 1 % IJ SOLN
INTRAMUSCULAR | Status: AC
  Filled 2013-08-23: qty 5

## 2013-08-23 MED ORDER — LIDOCAINE HCL (PF) 1 % IJ SOLN
0.9000 mL | Freq: Once | INTRAMUSCULAR | Status: AC
  Administered 2013-08-23: 0.9 mL via INTRADERMAL

## 2013-08-23 MED ORDER — CEFTRIAXONE SODIUM 250 MG IJ SOLR
250.0000 mg | Freq: Once | INTRAMUSCULAR | Status: AC
  Administered 2013-08-23: 250 mg via INTRAMUSCULAR
  Filled 2013-08-23: qty 250

## 2013-08-23 MED ORDER — AZITHROMYCIN 250 MG PO TABS
1000.0000 mg | ORAL_TABLET | Freq: Once | ORAL | Status: AC
  Administered 2013-08-23: 1000 mg via ORAL
  Filled 2013-08-23: qty 4

## 2013-08-23 MED ORDER — HYDROCODONE-ACETAMINOPHEN 5-325 MG PO TABS
2.0000 | ORAL_TABLET | Freq: Once | ORAL | Status: AC
  Administered 2013-08-23: 2 via ORAL
  Filled 2013-08-23: qty 2

## 2013-08-23 MED ORDER — CEPHALEXIN 500 MG PO CAPS: 500.0000 mg | ORAL_CAPSULE | Freq: Two times a day (BID) | ORAL | Status: DC

## 2013-08-23 MED ORDER — METRONIDAZOLE 500 MG PO TABS
500.0000 mg | ORAL_TABLET | Freq: Two times a day (BID) | ORAL | Status: DC
Start: 2013-08-23 — End: 2022-06-20
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
## 2013-08-23 NOTE — Discharge Instructions (Signed)
Sexual Assault or Rape  Sexual assault is any sexual activity that a person is forced, threatened, or coerced into participating in. It may or may not involve physical contact. You are being sexually abused if you are forced to have sexual contact of any kind. Sexual assault is called rape if penetration has occurred (vaginal, oral, or anal). Many times, sexual assaults are committed by a friend, relative, or associate. Sexual assault and rape are never the victim's fault.   Sexual assault can result in various health problems for the person who was assaulted. Some of these problems include:  · Physical injuries in the genital area or other areas of the body.  · Risk of unwanted pregnancy.  · Risk of sexually transmitted infections (STIs).  · Psychological problems such as anxiety, depression, or posttraumatic stress disorder.  WHAT STEPS SHOULD BE TAKEN AFTER A SEXUAL ASSAULT?  If you have been sexually assaulted, you should take the following steps as soon as possible:  · Go to a safe area as quickly as possible and call your local emergency services (911 in U.S.). Get away from the area where you have been attacked.    · Do not wash, shower, comb your hair, or clean any part of your body.    · Do not change your clothes.    · Do not remove or touch anything in the area where you were assaulted.    · Go to an emergency room for a complete physical exam. Get the necessary tests to protect yourself from STIs or pregnancy. You may be treated for an STI even if no signs of one are present. Emergency contraceptive medicines are also available to help prevent pregnancy, if this is desired. You may need to be examined by a specially trained health care provider.  · Have the health care provider collect evidence during the exam, even if you are not sure if you will file a report with the police.  · Find out how to file the correct papers with the authorities. This is important for all assaults, even if they were committed  by a family member or friend.  · Find out where you can get additional help and support, such as a local rape crisis center.  · Follow up with your health care provider as directed.    HOW CAN YOU REDUCE THE CHANCES OF SEXUAL ASSAULT?  Take the following steps to help reduce your chances of being sexually assaulted:  · Consider carrying mace or pepper spray for protection against an attacker.    · Consider taking a self-defense course.  · Do not try to fight off an attacker if he or she has a gun or knife.    · Be aware of your surroundings, what is happening around you, and who might be there.    · Be assertive, trust your instincts, and walk with confidence and direction.  · Be careful not to drink too much alcohol or use other intoxicants. These can reduce your ability to fight off an assault.  · Always lock your doors and windows. Be sure to have high-quality locks for your home.    · Do not let people enter your house if you do not know them.    · Get a home security system that has a siren if you are able.    · Protect the keys to your house and car. Do not lend them out. Do not put your name and address on them. If you lose them, get your locks changed.    · Always   so that you travel on well-lit and frequently used streets.  Keep your car serviced. Always have at least half a tank of gas in it.   Do not go into isolated areas alone. This includes open garages, empty buildings or offices, or [employer].   Do not walk or jog alone, especially when it is dark.   Never hitchhike.   If your car breaks down, call the police for help on your cell phone and stay inside the car with your doors locked and windows up.   If you are being followed, go to a busy area and call for help.   If you are stopped by a police officer, especially one in  an unmarked police car, keep your door locked. Do not put your window down all the way. Ask the officer to show you identification first.   Be aware of "date rape drugs" that can be placed in a drink when you are not looking. These drugs can make you unable to fight off an assault. FOR MORE INFORMATION  Office on [employer], U.S. Department of Health and Human Services: JuniorPods.pl  National Sexual Assault Hotline: 1-800-656-HOPE 586-669-2375)  [place]: 1-800-799-SAFE (937)350-0991) or www.thehotline.org Document Released: 01/12/2000 Document Revised: 09/16/2012 Document Reviewed: 06/17/2012 [facility] Patient Information 2015 [place], [place]. This information is not intended to replace advice given to you by your health care provider. Make sure you discuss any questions you have with your health care provider. Bacterial Vaginosis Bacterial vaginosis is a vaginal infection that occurs when the normal balance of bacteria in the vagina is disrupted. It results from an overgrowth of certain bacteria. This is the most common vaginal infection in women of childbearing age. Treatment is important to prevent complications, especially in pregnant women, as it can cause a premature delivery. CAUSES  Bacterial vaginosis is caused by an increase in harmful bacteria that are normally present in smaller amounts in the vagina. Several different kinds of bacteria can cause bacterial vaginosis. However, the reason that the condition develops is not fully understood. RISK FACTORS Certain activities or behaviors can put you at an increased risk of developing bacterial vaginosis, including:  Having a new sex partner or multiple sex partners.  Douching.  Using an intrauterine device (IUD) for contraception. Women do not get bacterial vaginosis from toilet seats, bedding, swimming pools, or contact with objects around  them. SIGNS AND SYMPTOMS  Some women with bacterial vaginosis have no signs or symptoms. Common symptoms include:  Grey vaginal discharge.  A fishlike odor with discharge, especially after sexual intercourse.  Itching or burning of the vagina and vulva.  Burning or pain with urination. DIAGNOSIS  Your health care provider will take a medical history and examine the vagina for signs of bacterial vaginosis. A sample of vaginal fluid may be taken. Your health care provider will look at this sample under a microscope to check for bacteria and abnormal cells. A vaginal pH test may also be done.  TREATMENT  Bacterial vaginosis may be treated with antibiotic medicines. These may be given in the form of a pill or a vaginal cream. A second round of antibiotics may be prescribed if the condition comes back after treatment.  HOME CARE INSTRUCTIONS   Only take over-the-counter or prescription medicines as directed by your health care provider.  If antibiotic medicine was prescribed, take it as directed. Make sure you finish it even if you start to feel better.  Do not have sex until treatment  is completed.  Tell all sexual partners that you have a vaginal infection. They should see their health care provider and be treated if they have problems, such as a mild rash or itching.  Practice safe sex by using condoms and only having one sex partner. SEEK MEDICAL CARE IF:   Your symptoms are not improving after 3 days of treatment.  You have increased discharge or pain.  You have a fever. MAKE SURE YOU:   Understand these instructions.  Will watch your condition.  Will get help right away if you are not doing well or get worse. FOR MORE INFORMATION  Centers for Disease Control and Prevention, Division of STD Prevention: AppraiserFraud.fi American Sexual Health Association (ASHA): www.ashastd.org  Document Released: 01/14/2005 Document Revised: 11/04/2012 Document Reviewed:  08/26/2012 [facility] Patient Information 2015 [place], [place]. This information is not intended to replace advice given to you by your health care provider. Make sure you discuss any questions you have with your health care provider. Urinary Tract Infection Urinary tract infections (UTIs) can develop anywhere along your urinary tract. Your urinary tract is your body's drainage system for removing wastes and extra water. Your urinary tract includes two kidneys, two ureters, a bladder, and a urethra. Your kidneys are a pair of bean-shaped organs. Each kidney is about the size of your fist. They are located below your ribs, one on each side of your spine. CAUSES Infections are caused by microbes, which are microscopic organisms, including fungi, viruses, and bacteria. These organisms are so small that they can only be seen through a microscope. Bacteria are the microbes that most commonly cause UTIs. SYMPTOMS  Symptoms of UTIs may vary by age and gender of the patient and by the location of the infection. Symptoms in young women typically include a frequent and intense urge to urinate and a painful, burning feeling in the bladder or urethra during urination. Older women and men are more likely to be tired, shaky, and weak and have muscle aches and abdominal pain. A fever may mean the infection is in your kidneys. Other symptoms of a kidney infection include pain in your back or sides below the ribs, nausea, and vomiting. DIAGNOSIS To diagnose a UTI, your caregiver will ask you about your symptoms. Your caregiver also will ask to provide a urine sample. The urine sample will be tested for bacteria and white blood cells. White blood cells are made by your body to help fight infection. TREATMENT  Typically, UTIs can be treated with medication. Because most UTIs are caused by a bacterial infection, they usually can be treated with the use of antibiotics. The choice of antibiotic and length of treatment depend on  your symptoms and the type of bacteria causing your infection. HOME CARE INSTRUCTIONS  If you were prescribed antibiotics, take them exactly as your caregiver instructs you. Finish the medication even if you feel better after you have only taken some of the medication.  Drink enough water and fluids to keep your urine clear or pale yellow.  Avoid caffeine, tea, and carbonated beverages. They tend to irritate your bladder.  Empty your bladder often. Avoid holding urine for long periods of time.  Empty your bladder before and after sexual intercourse.  After a bowel movement, women should cleanse from front to back. Use each tissue only once. SEEK MEDICAL CARE IF:   You have back pain.  You develop a fever.  Your symptoms do not begin to resolve within 3 days. [place]  IF:   You have severe back pain or lower abdominal pain.  You develop chills.  You have nausea or vomiting.  You have continued burning or discomfort with urination. MAKE SURE YOU:   Understand these instructions.  Will watch your condition.  Will get help right away if you are not doing well or get worse. Document Released: 10/24/2004 Document Revised: 07/16/2011 Document Reviewed: 02/22/2011 [facility] Patient Information 2015 [place], [place]. This information is not intended to replace advice given to you by your health care provider. Make sure you discuss any questions you have with your health care provider.     Emergency Department Resource Guide 1) Find a Doctor and Pay Out of Pocket Although you won't have to find out who is covered by your insurance plan, it is a good idea to ask around and get recommendations. You will then need to call the office and see if the doctor you have chosen will accept you as a new patient and what types of options they offer for patients who are self-pay. Some doctors offer discounts or will set up payment plans for their patients who do not have  insurance, but you will need to ask so you aren't surprised when you get to your appointment.  2) Contact Your Local Health Department Not all health departments have doctors that can see patients for sick visits, but many do, so it is worth a call to see if yours does. If you don't know where your local health department is, you can check in your phone book. The CDC also has a tool to help you locate your state's health department, and many state websites also have listings of all of their local health departments.  3) Find a [facility] If your illness is not likely to be very severe or complicated, you may want to try a walk in clinic. These are popping up all over the country in pharmacies, drugstores, and shopping centers. They're usually staffed by nurse practitioners or physician assistants that have been trained to treat common illnesses and complaints. They're usually fairly quick and inexpensive. However, if you have serious medical issues or chronic medical problems, these are probably not your best option.  No Primary Care Doctor: - Call Health Connect at  [phone] - they can help you locate a primary care doctor that  accepts your insurance, provides certain services, etc. - Physician Referral Service- [phone]  Chronic Pain Problems: Organization         Address  Phone   Notes  [facility]  [phone] Patients need to be referred by their primary care doctor.   Medication Assistance: Organization         Address  Phone   Notes  [facility] Medication [facility] [address] [place], [place] [address] [phone] --Must be a resident of [facility] -- Must have NO insurance coverage whatsoever (no Medicaid/ Medicare, etc.) -- The pt. MUST have a primary care doctor that directs their care regularly and follows them in the community   MedAssist  [phone]   [employer]  [phone]    Agencies that provide  inexpensive medical care: Organization         Address  Phone   Notes  [place]  [phone]   [facility]    [phone]   [facility] [place], [place] [address] [phone]  [facility] [address] [place] [phone]   Planned Parenthood    [phone]   [facility]    [phone]   [place] and [address], [place] Phone:  [phone], Fax:  [phone] Hours of Operation:  9 am - 6 pm, M-F.  Also accepts Medicaid/Medicare and self-pay.  [facility] for [place], [address], [place] Phone: [phone], Fax: [phone]. Hours of Operation:  8:30 am - 5:30 pm, M-F.  Also accepts Medicaid and self-pay.  [facility] [address], [place] Phone: [phone]   [place], [place], [place] [phone], Ext. [phone] Mondays & Thursdays: 7-9 AM.  First 15 patients are seen on a first come, first serve basis.    [place] Providers:  Organization         Address  Phone   Notes  [facility] [address], [place] [phone] Also accepts self-pay patients.  [facility] [address], [place]  [phone]   [place], [address], [place] [phone]   [facility] [address], [place] [phone]   Lucianne Lei [address], [place]   [phone] Only accepts [place] Access [place] patients after they have their name applied to their card.   Self-Pay (no insurance) in [facility]:  Organization         Address  Phone   Notes  Sickle Cell Patients, [facility] [place] [phone]   [facility] Urgent  Care [place] [phone]   [facility] [place]  [place], [place], [place] [phone]   [facility]/Dr. Osei-Bonsu  [address], [place] or [address], [place] [phone] Phone number for both [place] and [place] locations is the same.  Urgent Medical and [facility] [address] [place] [phone]   [facility] [address] [place] or [address] [phone]   [facility] [address], [place] [phone], phone; [phone], fax Sees patients 1st and 3rd Saturday of every month.  Must not qualify for public or private insurance (i.e. Medicaid, Medicare, [place] Health Choice, Veterans' Benefits)  Household income should be no more than 200% of the poverty level The clinic cannot treat you if you are pregnant or think you are pregnant  Sexually transmitted diseases are not treated at the clinic.    Dental Care: Organization         Address  Phone  Notes  [facility] Department of [facility] [place] [phone] Accepts children up to age 78 who are enrolled in [place] or [place]; pregnant women with a Medicaid card; and children who have applied for Medicaid or [place] Health Choice, but were declined, whose parents can pay a reduced fee at time of service.  [facility] Department of [facility]  [address], [place] [phone] Accepts children up to age 17 who are enrolled in [place] or [place]; pregnant women with a Medicaid card; and children  who have applied for Medicaid or [place] Health Choice, but were declined, whose parents can pay a reduced fee at time of service.  [facility]  [place] [phone] Patients are seen by appointment only. Walk-ins are  not accepted. North will see patients 30 years of age and older. Monday - Tuesday (8am-5pm) Most Wednesdays (8:30-5pm) $30 per visit, cash only  [facility]  [address], [facility] [phone] Patients are seen by appointment only. Walk-ins are not accepted. [place] will see patients 17 years of age and older. One Wednesday Evening (Monthly: Volunteer Based).  $30 per visit, cash only  [place]  [phone] for adults; Children under age 45, call Graduate Pediatric Dentistry at [phone]. Children aged 70-14, please call [phone] to request a pediatric application.  Dental services are provided in all areas of dental care including fillings, crowns and bridges, complete and partial dentures, implants, gum treatment, root canals, and extractions. Preventive care is also provided. Treatment is provided to both adults and children. Patients are selected via a lottery and there is often a waiting list.   [facility] [address] [place]  [phone] [email or web address]   [facility] [address] [place], [place] [phone], Ext. [phone] Second and Fourth Thursday of each month, opens at 6:30 AM; Clinic ends at 9 AM.  Patients are seen on a first-come first-served basis, and a limited number are seen during each clinic.   [facility]  [address] Hillard Danker [place], [place] [phone]   Eligibility Requirements You must have lived in [place], [place], or [place] counties for at least the last three months.   You cannot be eligible for state or federal sponsored [employer], including [employer], [place], or [employer].   You generally cannot be eligible for healthcare insurance through your employer.    How to apply: Eligibility screenings are held every Tuesday and Wednesday afternoon from 1:00 pm until 4:00 pm. You do not need an appointment for  the interview!  [facility] [address] [place], [place]   [place]  [place] Department  [place]  [phone]    Behavioral Health Resources in the Community: Intensive Outpatient Programs Organization         Address  Phone  Notes  [place]. [address], [place], [place] [phone]   [facility] [address], [place], [place]   ADS: Alcohol & Drug Svcs [address] [place], [place]   [place] [address]  [place], [place] or [phone]   Substance Abuse Resources Organization         Address  Phone  Notes  Alcohol and Drug Services  [phone]   [place]  [phone]   [facility] [place]   Chinita Pester  [phone]   Residential & Outpatient Substance Abuse Program  [phone]   Psychological Services Organization         Address  Phone  Notes  [facility] [place]  [place]  [phone]   [place] [address] [place] or [phone]    Mobile Crisis Teams Organization  Address  Phone  Notes  [facility], Mobile Crisis Care Unit  [phone]   [facility]  [address] [place], [place]   [facility] [address] [place] [phone]    Self-Help/Support Groups Organization         Address  Phone             Notes  [place]. of [place] - variety of support groups  [place] Call for more information  Narcotics Anonymous (NA), [facility] [address], [employer] [place]  2 meetings at this location   Special educational needs teacher         Address  Phone  Notes  ASAP Residential Treatment  [place],    [place]  [phone]   [facility]  [address] [place] 263335, [place], [place]   [place], [place] [phone] Admissions: 8am-3pm M-F  Incentives Substance [place] [address]    [place], [place] [phone]   [facility] [address] [place], [place], [place]   [facility] [address],  [place], [place]   [facility] Programs - Intensive Outpatient [address], [place], [place]   [facility] [place].,  [place], [place] [phone] or [phone]   [facility]idential Treatment Services (RTS) [address] [place], [place] Accepts Medicaid  [address],  [place] [phone] Substance Abuse/Addiction Treatment   [facility] Organization         Address  Phone  Notes  [facility]  [phone]   Domenic Schwab, PhD [address] [place], [place]   [phone] or [phone]   [place] [place], [place] [phone]   [facility] [address], [place], [place] [phone] Insurance/Medicaid/sponsorship through [facility] and Families [address] [place]                                    [place], [place] [phone] [place] [address], [place] [phone]    Dr. Adele Schilder  [phone]   [facility] Dept. 1) [address] [place] 2) [address] [place] 3)  [place] [address], [place] [phone]  [phone]   [place] [phone] or [phone] (After Hours)       [place] [email or web address] [place] #  [place], [place] [phone]    [place].com [address] [place], [place] ([place]) [place] # [place], [place] [phone]   [facility] [email or web address] [address] [name], [place] [phone]   [facility] [email or web address] [address] [place], [place] [phone]   [facility] [email or web address], [place] [phone]
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
## 2013-08-23 NOTE — ED Notes (Signed)
Pt states she did not want any one to know that she is here in the ED, pt states "I don't want anybody to know I'm here." Pt boyfriend came to visit the pt and asked the boyfriend to leave. Pt states "I told my boyfriend I was in the ER but I didn't want him to visit."  Registration notified to make XXX arm band.

## 2013-08-24 LAB — GC/CHLAMYDIA PROBE AMP
CT PROBE, AMP APTIMA: NEGATIVE
GC PROBE AMP APTIMA: NEGATIVE

## 2013-08-25 LAB — URINE CULTURE: Colony Count: >=100000

## 2013-08-26 ENCOUNTER — Telehealth (HOSPITAL_COMMUNITY): Payer: Self-pay

## 2013-08-26 NOTE — ED Notes (Signed)
Post ED Visit - Positive Culture Follow-up  Culture report reviewed by antimicrobial stewardship pharmacist: []  Wes Dulaney, Pharm.D., BCPS [x]  Heide Guile, Pharm.D., BCPS []  Alycia Rossetti, Pharm.D., BCPS []  [place], Pharm.D., BCPS, AAHIVP []  Legrand Como, Pharm.D., BCPS, AAHIVP []    Positive urine culture Treated with cephalexin, organism sensitive to the same and no further patient follow-up is required at this time.  Ileene Musa 08/26/2013, 10:37 AM

## 2013-10-12 ENCOUNTER — Encounter (HOSPITAL_COMMUNITY): Payer: Self-pay | Admitting: Emergency Medicine

## 2013-10-12 ENCOUNTER — Emergency Department (HOSPITAL_COMMUNITY)
Admission: EM | Admit: 2013-10-12 | Discharge: 2013-10-13 | Disposition: A | Discharge status: Home / Self Care | Payer: No Typology Code available for payment source | Attending: Emergency Medicine | Admitting: Emergency Medicine

## 2013-10-12 DIAGNOSIS — IMO0002 Reserved for concepts with insufficient information to code with codable children: Secondary | ICD-10-CM | POA: Insufficient documentation

## 2013-10-12 DIAGNOSIS — E669 Obesity, unspecified: Secondary | ICD-10-CM | POA: Insufficient documentation

## 2013-10-12 DIAGNOSIS — J45909 Unspecified asthma, uncomplicated: Secondary | ICD-10-CM | POA: Insufficient documentation

## 2013-10-12 DIAGNOSIS — W19XXXA Unspecified fall, initial encounter: Secondary | ICD-10-CM

## 2013-10-12 DIAGNOSIS — Z792 Long term (current) use of antibiotics: Secondary | ICD-10-CM | POA: Insufficient documentation

## 2013-10-12 DIAGNOSIS — W06XXXA Fall from bed, initial encounter: Secondary | ICD-10-CM | POA: Insufficient documentation

## 2013-10-12 DIAGNOSIS — G40909 Epilepsy, unspecified, not intractable, without status epilepticus: Secondary | ICD-10-CM | POA: Insufficient documentation

## 2013-10-12 DIAGNOSIS — Y9289 Other specified places as the place of occurrence of the external cause: Secondary | ICD-10-CM | POA: Insufficient documentation

## 2013-10-12 DIAGNOSIS — E119 Type 2 diabetes mellitus without complications: Secondary | ICD-10-CM | POA: Insufficient documentation

## 2013-10-12 DIAGNOSIS — I10 Essential (primary) hypertension: Secondary | ICD-10-CM | POA: Insufficient documentation

## 2013-10-12 DIAGNOSIS — Z3202 Encounter for pregnancy test, result negative: Secondary | ICD-10-CM | POA: Insufficient documentation

## 2013-10-12 DIAGNOSIS — F319 Bipolar disorder, unspecified: Secondary | ICD-10-CM | POA: Insufficient documentation

## 2013-10-12 DIAGNOSIS — S39011A Strain of muscle, fascia and tendon of abdomen, initial encounter: Secondary | ICD-10-CM

## 2013-10-12 DIAGNOSIS — Z79899 Other long term (current) drug therapy: Secondary | ICD-10-CM | POA: Insufficient documentation

## 2013-10-12 DIAGNOSIS — Y9389 Activity, other specified: Secondary | ICD-10-CM | POA: Insufficient documentation

## 2013-10-12 NOTE — ED Notes (Signed)
Pt states she recently fell getting into a bunk bed and now has L side pain and low back pain. Alert and oriented.

## 2013-10-12 NOTE — ED Provider Notes (Signed)
CSN: [id number]     Arrival date & time 10/12/13  2244 History   First MD Initiated Contact with Patient 10/12/13 2351     This chart was scribed for non-physician practitioner, Antonietta Breach, PA-C working with Dr. Shirlyn Goltz by Forrestine Him, ED Scribe. This patient was seen in room WTR6/WTR6 and the patient's care was started at 12:00 AM.   Chief Complaint  Patient presents with  . Fall  . Back Pain   The history is provided by the patient. No language interpreter was used.    HPI Comments: [name] [name] is a 38 y.o. female with a PMHx of bipolar 1 disorder, PTSD, schizophrenia, HTN, and diabetes mellitus who presents to the Emergency Department complaining of a fall that occurred a few days ago. Pt states she was attempting to get into a bunker bed when she fell to the ground landing on the L side of her back. She denies any head trauma or LOC. She now c/o constant, moderate pain to her L side along with lower back pain. Pain is exacerbated with coughing, sneezing, and with all movement. She denies any bowel or urinary incontinence since time of fall. Ms. Treadway denies any fever, dysuria, or chills. No weakness, loss of sensation, or paresthesia. 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 No history of cancer.  Past Medical History  Diagnosis Date  . Bipolar 1 disorder   . PTSD (post-traumatic stress disorder)   . Schizophrenia   . Depression   . Seizures   . Hypertension   . Asthma   . Obesity   . Diabetes mellitus without complication     pt states hypoglycemic   Past Surgical History  Procedure Laterality Date  . Cholecystectomy    . Cesarean section     History reviewed. No pertinent family history. History  Substance Use Topics  . Smoking status: Never Smoker   . Smokeless tobacco: Not on file  . Alcohol Use: Yes   OB History   Grav Para Term Preterm Abortions TAB SAB Ect Mult Living                  Review of Systems   Constitutional: Negative for fever and chills.  Musculoskeletal: Positive for arthralgias and back pain.  Neurological: Negative for weakness and numbness.  All other systems reviewed and are negative.   Allergies  Aspirin; Bee venom; Sulfa antibiotics; and Ultram  Home Medications   Prior to Admission medications   Medication Sig Start Date End Date Taking? Authorizing Provider  albuterol (PROVENTIL HFA;VENTOLIN HFA) 108 (90 BASE) MCG/ACT inhaler Inhale 2 puffs into the lungs every 6 (six) hours as needed for wheezing or shortness of breath (SOB).    Historical Provider, MD  cephALEXin (KEFLEX) 500 MG capsule Take 1 capsule (500 mg total) by mouth 2 (two) times daily. 08/23/13   Kristen N Ward, DO  cyclobenzaprine (FLEXERIL) 10 MG tablet Take 1 tablet (10 mg total) by mouth 2 (two) times daily as needed for muscle spasms. 10/13/13   Antonietta Breach, PA-C  HYDROcodone-acetaminophen (NORCO/VICODIN) 5-325 MG per tablet Take 1-2 tablets by mouth every 6 (six) hours as needed for moderate pain or severe pain. 10/13/13   Antonietta Breach, PA-C  ibuprofen (ADVIL,MOTRIN) 200 MG tablet Take 400 mg by mouth every 6 (six) hours as needed for mild pain.    Historical Provider, MD  levETIRAcetam (KEPPRA) 500 MG tablet Take 500 mg  by mouth 2 (two) times daily.    Historical Provider, MD  metroNIDAZOLE (FLAGYL) 500 MG tablet Take 1 tablet (500 mg total) by mouth 2 (two) times daily. Do not drink alcohol with this medication. 08/23/13   Kristen N Ward, DO  trolamine salicylate (ASPERCREME) 10 % cream Apply 1 application topically as needed for muscle pain.    Historical Provider, MD   Triage Vitals: BP 138/79  Pulse 77  Temp(Src) 98.2 F (36.8 C) (Oral)  Resp 18  SpO2 97%   Physical Exam  Nursing note and vitals reviewed. Constitutional: She is oriented to person, place, and time. She appears well-developed and well-nourished. No distress.  Nontoxic/nonseptic appearing  HENT:  Head: Normocephalic and  atraumatic.  Eyes: Conjunctivae and EOM are normal. No scleral icterus.  Neck: Normal range of motion.  Cardiovascular: Normal rate, regular rhythm and intact distal pulses.   DP and PT pulses 2+ bilaterally  Pulmonary/Chest: Effort normal. No respiratory distress.  Chest expansion symmetric  Abdominal: Soft.  Soft morbidly obese abdomen without focal tenderness to palpation  Musculoskeletal:  Tenderness to palpation of left hip joint. Tenderness is diffuse without focal bony tenderness. No crepitus, deformity, shortening, or malrotation of left lower extremity. Patient also with tenderness to palpation of her left lumbar paraspinal muscles. No tenderness to palpation of the lumbosacral midline. No bony deformities, step-off, or crepitus. Exam today is limited by patient's body habitus  Neurological: She is alert and oriented to person, place, and time. She exhibits normal muscle tone. Coordination normal.  Sensation to light touch intact in bilateral lower extremities.  Skin: Skin is warm and dry. No rash noted. She is not diaphoretic. No erythema. No pallor.  Psychiatric: She has a normal mood and affect. Her behavior is normal.    ED Course  Procedures (including critical care time)  DIAGNOSTIC STUDIES: Oxygen Saturation is 100% on RA, Normal by my interpretation.    COORDINATION OF CARE: 8:20 AM-Discussed treatment plan with pt at bedside and pt agreed to plan.     Labs Review Labs Reviewed  URINALYSIS, ROUTINE W REFLEX MICROSCOPIC  PREGNANCY, URINE   Imaging Review Dg Lumbar Spine Complete  10/13/2013   CLINICAL DATA:  Patient fell 2 days ago. Lower back pain. Limited movement. Left groin pain extends to the left hip.  EXAM: LUMBAR SPINE - COMPLETE 4+ VIEW  COMPARISON:  05/07/2013  FINDINGS: There is no evidence of lumbar spine fracture. Alignment is normal. Intervertebral disc spaces are maintained. Surgical clips are identified in the right upper quadrant of the abdomen.   IMPRESSION: No evidence for acute  abnormality.   Electronically Signed   By: Shon Hale M.D.   On: 10/13/2013 02:11   Dg Hip Complete Left  10/13/2013   CLINICAL DATA:  Status post fall; left groin pain, extending to the left hip.  EXAM: LEFT HIP - COMPLETE 2+ VIEW  COMPARISON:  None.  FINDINGS: There is no evidence of fracture or dislocation. Both femoral heads are seated normally within their respective acetabula. The proximal left femur appears intact. No significant degenerative change is appreciated. The sacroiliac joints are unremarkable in appearance.  The visualized bowel gas pattern is grossly unremarkable in appearance. Two small foci of air overlying the left lower quadrant pannus may reflect injection sites.  IMPRESSION: No evidence of fracture or dislocation.   Electronically Signed   By: Garald Balding M.D.   On: 10/13/2013 02:10     EKG Interpretation None  MDM   Final diagnoses:  Strain of groin, initial encounter  Fall, initial encounter    Patient presents to the emergency department for further evaluation of left hip pain and left sided low back pain secondary to a fall. Patient denies head trauma or loss of consciousness. She is neurovascularly intact on exam. Patient with tenderness to palpation of her left hip as well as left low back. No focal bony tenderness appreciated. Exam today is limited secondary to body habitus. No leg shortening or malrotation.  Imaging today is negative for fracture, dislocation, or bony deformity. No red flags or signs concerning for cauda equina today. Do not believe further emergent workup is indicated. Pain controlled in ED with Toradol, Dilaudid, and Valium. Patient will be discharged with short course of Vicodin and Flexeril. Icing and rest advised and return precautions provided. Patient agreeable to plan with no unaddressed concerns.  I personally performed the services described in this documentation, which was scribed in my  presence. The recorded information has been reviewed and is accurate.    Filed Vitals:   10/12/13 2324 10/13/13 0254  BP: 123/77 138/79  Pulse: 65 77  Temp: 98.2 F (36.8 C)   TempSrc: Oral   Resp: 16 18  SpO2: 100% 97%     Antonietta Breach, PA-C 10/15/13 0825

## 2013-10-13 ENCOUNTER — Emergency Department (HOSPITAL_COMMUNITY): Payer: No Typology Code available for payment source

## 2013-10-13 ENCOUNTER — Emergency Department (HOSPITAL_COMMUNITY): Payer: Self-pay

## 2013-10-13 LAB — URINALYSIS, ROUTINE W REFLEX MICROSCOPIC
BILIRUBIN URINE: NEGATIVE
Glucose, UA: NEGATIVE mg/dL
Hgb urine dipstick: NEGATIVE
Ketones, ur: NEGATIVE mg/dL
Leukocytes, UA: NEGATIVE
Nitrite: NEGATIVE
PROTEIN: NEGATIVE mg/dL
SPECIFIC GRAVITY, URINE: 1.009 (ref 1.005–1.030)
UROBILINOGEN UA: 0.2 mg/dL (ref 0.0–1.0)
pH: 6 (ref 5.0–8.0)

## 2013-10-13 LAB — PREGNANCY, URINE: Preg Test, Ur: NEGATIVE

## 2013-10-13 MED ORDER — HYDROMORPHONE HCL PF 1 MG/ML IJ SOLN
1.0000 mg | Freq: Once | INTRAMUSCULAR | Status: AC
  Administered 2013-10-13: 1 mg via INTRAMUSCULAR
  Filled 2013-10-13: qty 1

## 2013-10-13 MED ORDER — KETOROLAC TROMETHAMINE 60 MG/2ML IM SOLN
60.0000 mg | Freq: Once | INTRAMUSCULAR | Status: AC
  Administered 2013-10-13: 60 mg via INTRAMUSCULAR
  Filled 2013-10-13: qty 2

## 2013-10-13 MED ORDER — DIAZEPAM 5 MG/ML IJ SOLN
3.7500 mg | Freq: Once | INTRAMUSCULAR | Status: AC
  Administered 2013-10-13: 3.75 mg via INTRAVENOUS
  Filled 2013-10-13: qty 2

## 2013-10-13 MED ORDER — HYDROCODONE-ACETAMINOPHEN 5-325 MG PO TABS: 1.0000 | ORAL_TABLET | Freq: Four times a day (QID) | ORAL | Status: DC | PRN

## 2013-10-13 MED ORDER — CYCLOBENZAPRINE HCL 10 MG PO TABS: 10.0000 mg | ORAL_TABLET | Freq: Two times a day (BID) | ORAL | Status: DC | PRN

## 2013-10-13 NOTE — Discharge Instructions (Signed)
Groin Strain °A groin strain (also called a groin pull) is an injury to the muscles or tendon on the upper inner part of the thigh. These muscles are called the adductor muscles or groin muscles. They are responsible for moving the leg across the body. A muscle strain occurs when a muscle is overstretched and some muscle fibers are torn. A groin strain can range from mild to severe depending on how many muscle fibers are affected and whether the muscle fibers are partially or completely torn.  °Groin strains usually occur during exercise or participation in sports. The injury often happens when a sudden, violent force is placed on a muscle, stretching the muscle too far. A strain is more likely to occur when your muscles are not warmed up or if you are not properly conditioned. Depending on the severity of the groin strain, recovery time may vary from a few weeks to several weeks. Severe injuries often require 4-6 weeks for recovery. In these cases, complete healing can take 4-5 months.  °CAUSES  °· Stretching the groin muscles too far or too suddenly, often during side-to-side motion with an abrupt change in direction. °· Putting repeated stress on the groin muscles over a long period of time. °· Performing vigorous activity without properly stretching the groin muscles beforehand. °SYMPTOMS  °· Pain and tenderness in the groin area. This begins as sharp pain and persists as a dull ache. °· Popping or snapping feeling when the injury occurs (for severe strains). °· Swelling or bruising. °· Muscle spasms. °· Weakness in the leg. °· Stiffness in the groin area with decreased ability to move the affected muscles. °DIAGNOSIS  °Your caregiver will perform a physical exam to diagnose a groin strain. You will be asked about your symptoms and how the injury occurred. X-rays are sometimes needed to rule out a broken bone or cartilage problems. Your caregiver may order a CT scan or MRI if a complete muscle tear is  suspected. °TREATMENT  °A groin strain will often heal on its own. Your caregiver may prescribe medicines to help manage pain and swelling (anti-inflammatory medicine). You may be told to use crutches for the first few days to minimize your pain. °HOME CARE INSTRUCTIONS  °· Rest. Do not use the strained muscle if it causes pain. °· Put ice on the injured area. °¨ Put ice in a plastic bag. °¨ Place a towel between your skin and the bag. °¨ Leave the ice on for 15-20 minutes, every 2-3 hours. Do this for the first 2 days after the injury.  °· Only take over-the-counter or prescription medicines as directed by your caregiver. °· Wrap the injured area with an elastic bandage as directed by your caregiver. °· Keep the injured leg raised (elevated). °· Walk, stretch, and perform range-of-motion exercises to improve blood flow to the injured area. Only perform these activities if you can do so without any pain. °To prevent muscle strains: °· Warm up before exercise. °· Develop proper conditioning and strength in the groin muscles. °SEEK IMMEDIATE MEDICAL CARE IF:  °· You have increased pain or swelling in the affected area.   °· Your symptoms are not improving or are getting worse. °MAKE SURE YOU:  °· Understand these instructions. °· Will watch your condition. °· Will get help right away if you are not doing well or get worse. °Document Released: 09/12/2003 Document Revised: 01/01/2012 Document Reviewed: 09/18/2011 °[employer]® Patient Information ©2015 [employer]. This information is not intended to replace advice given to you   by your health care provider. Make sure you discuss any questions you have with your health care provider. Back Pain, Adult Low back pain is very common. About 1 in 5 people have back pain.The cause of low back pain is rarely dangerous. The pain often gets better over time.About half of people with a sudden onset of back pain feel better in just 2 weeks. About 8 in 10 people feel better by 6  weeks.  CAUSES Some common causes of back pain include:  Strain of the muscles or ligaments supporting the spine.  Wear and tear (degeneration) of the spinal discs.  Arthritis.  Direct injury to the back. DIAGNOSIS Most of the time, the direct cause of low back pain is not known.However, back pain can be treated effectively even when the exact cause of the pain is unknown.Answering your caregiver's questions about your overall health and symptoms is one of the most accurate ways to make sure the cause of your pain is not dangerous. If your caregiver needs more information, he or she may order lab work or imaging tests (X-rays or MRIs).However, even if imaging tests show changes in your back, this usually does not require surgery. HOME CARE INSTRUCTIONS For many people, back pain returns.Since low back pain is rarely dangerous, it is often a condition that people can learn to [facility] their own.   Remain active. It is stressful on the back to sit or stand in one place. Do not sit, drive, or stand in one place for more than 30 minutes at a time. Take short walks on level surfaces as soon as pain allows.Try to increase the length of time you walk each day.  Do not stay in bed.Resting more than 1 or 2 days can delay your recovery.  Do not avoid exercise or work.Your body is made to move.It is not dangerous to be active, even though your back may hurt.Your back will likely heal faster if you return to being active before your pain is gone.  Pay attention to your body when you bend and lift. Many people have less discomfortwhen lifting if they bend their knees, keep the load close to their bodies,and avoid twisting. Often, the most comfortable positions are those that put less stress on your recovering back.  Find a comfortable position to sleep. Use a firm mattress and lie on your side with your knees slightly bent. If you lie on your back, put a pillow under your knees.  Only take  over-the-counter or prescription medicines as directed by your caregiver. Over-the-counter medicines to reduce pain and inflammation are often the most helpful.Your caregiver may prescribe muscle relaxant drugs.These medicines help dull your pain so you can more quickly return to your normal activities and healthy exercise.  Put ice on the injured area.  Put ice in a plastic bag.  Place a towel between your skin and the bag.  Leave the ice on for 15-20 minutes, 03-04 times a day for the first 2 to 3 days. After that, ice and heat may be alternated to reduce pain and spasms.  Ask your caregiver about trying back exercises and gentle massage. This may be of some benefit.  Avoid feeling anxious or stressed.Stress increases muscle tension and can worsen back pain.It is important to recognize when you are anxious or stressed and learn ways to manage it.Exercise is a great option. SEEK MEDICAL CARE IF:  You have pain that is not relieved with rest or medicine.  You have pain that  does not improve in 1 week.  You have new symptoms.  You are generally not feeling well. SEEK IMMEDIATE MEDICAL CARE IF:   You have pain that radiates from your back into your legs.  You develop new bowel or bladder control problems.  You have unusual weakness or numbness in your arms or legs.  You develop nausea or vomiting.  You develop abdominal pain.  You feel faint. Document Released: 01/14/2005 Document Revised: 07/16/2011 Document Reviewed: 05/18/2013 [facility] Patient Information 2015 [place], [place]. This information is not intended to replace advice given to you by your health care provider. Make sure you discuss any questions you have with your health care provider.

## 2013-10-16 NOTE — ED Provider Notes (Signed)
Medical screening examination/treatment/procedure(s) were performed by non-physician practitioner and as supervising physician I was immediately available for consultation/collaboration.   EKG Interpretation None        Wandra Arthurs, MD 10/16/13 1525

## 2014-02-27 ENCOUNTER — Emergency Department (HOSPITAL_COMMUNITY): Payer: No Typology Code available for payment source

## 2014-02-27 ENCOUNTER — Emergency Department (HOSPITAL_COMMUNITY)
Admission: EM | Admit: 2014-02-27 | Discharge: 2014-02-27 | Disposition: A | Discharge status: Home / Self Care | Payer: Self-pay | Attending: Emergency Medicine | Admitting: Emergency Medicine

## 2014-02-27 ENCOUNTER — Encounter (HOSPITAL_COMMUNITY): Payer: Self-pay | Admitting: Physical Medicine and Rehabilitation

## 2014-02-27 DIAGNOSIS — Z3202 Encounter for pregnancy test, result negative: Secondary | ICD-10-CM | POA: Insufficient documentation

## 2014-02-27 DIAGNOSIS — J45901 Unspecified asthma with (acute) exacerbation: Secondary | ICD-10-CM | POA: Insufficient documentation

## 2014-02-27 DIAGNOSIS — Z792 Long term (current) use of antibiotics: Secondary | ICD-10-CM | POA: Insufficient documentation

## 2014-02-27 DIAGNOSIS — B9789 Other viral agents as the cause of diseases classified elsewhere: Secondary | ICD-10-CM

## 2014-02-27 DIAGNOSIS — Z9049 Acquired absence of other specified parts of digestive tract: Secondary | ICD-10-CM | POA: Insufficient documentation

## 2014-02-27 DIAGNOSIS — Z79899 Other long term (current) drug therapy: Secondary | ICD-10-CM | POA: Insufficient documentation

## 2014-02-27 DIAGNOSIS — E669 Obesity, unspecified: Secondary | ICD-10-CM | POA: Insufficient documentation

## 2014-02-27 DIAGNOSIS — F319 Bipolar disorder, unspecified: Secondary | ICD-10-CM | POA: Insufficient documentation

## 2014-02-27 DIAGNOSIS — R102 Pelvic and perineal pain: Secondary | ICD-10-CM | POA: Insufficient documentation

## 2014-02-27 DIAGNOSIS — R059 Cough, unspecified: Secondary | ICD-10-CM

## 2014-02-27 DIAGNOSIS — J069 Acute upper respiratory infection, unspecified: Secondary | ICD-10-CM | POA: Insufficient documentation

## 2014-02-27 DIAGNOSIS — I10 Essential (primary) hypertension: Secondary | ICD-10-CM | POA: Insufficient documentation

## 2014-02-27 DIAGNOSIS — R05 Cough: Secondary | ICD-10-CM

## 2014-02-27 DIAGNOSIS — E119 Type 2 diabetes mellitus without complications: Secondary | ICD-10-CM | POA: Insufficient documentation

## 2014-02-27 DIAGNOSIS — Z9889 Other specified postprocedural states: Secondary | ICD-10-CM | POA: Insufficient documentation

## 2014-02-27 DIAGNOSIS — R0602 Shortness of breath: Secondary | ICD-10-CM

## 2014-02-27 LAB — WET PREP, GENITAL
Clue Cells Wet Prep HPF POC: NONE SEEN
Trich, Wet Prep: NONE SEEN
WBC, Wet Prep HPF POC: NONE SEEN
YEAST WET PREP: NONE SEEN

## 2014-02-27 LAB — URINALYSIS, ROUTINE W REFLEX MICROSCOPIC
Bilirubin Urine: NEGATIVE
GLUCOSE, UA: NEGATIVE mg/dL
Hgb urine dipstick: NEGATIVE
Ketones, ur: NEGATIVE mg/dL
LEUKOCYTES UA: NEGATIVE
NITRITE: NEGATIVE
PROTEIN: NEGATIVE mg/dL
Specific Gravity, Urine: 1.023 (ref 1.005–1.030)
Urobilinogen, UA: 1 mg/dL (ref 0.0–1.0)
pH: 6.5 (ref 5.0–8.0)

## 2014-02-27 LAB — POC URINE PREG, ED: Preg Test, Ur: NEGATIVE

## 2014-02-27 LAB — CBG MONITORING, ED: Glucose-Capillary: 85 mg/dL (ref 70–99)

## 2014-02-27 MED ORDER — ALBUTEROL SULFATE (2.5 MG/3ML) 0.083% IN NEBU
5.0000 mg | INHALATION_SOLUTION | Freq: Once | RESPIRATORY_TRACT | Status: AC
  Administered 2014-02-27: 5 mg via RESPIRATORY_TRACT
  Filled 2014-02-27: qty 6

## 2014-02-27 MED ORDER — HYDROCODONE-ACETAMINOPHEN 5-325 MG PO TABS: 1.0000 | ORAL_TABLET | Freq: Four times a day (QID) | ORAL | Status: DC | PRN

## 2014-02-27 MED ORDER — IPRATROPIUM BROMIDE 0.02 % IN SOLN
0.5000 mg | Freq: Once | RESPIRATORY_TRACT | Status: AC
  Administered 2014-02-27: 0.5 mg via RESPIRATORY_TRACT
  Filled 2014-02-27: qty 2.5

## 2014-02-27 MED ORDER — OXYCODONE-ACETAMINOPHEN 5-325 MG PO TABS
2.0000 | ORAL_TABLET | Freq: Once | ORAL | Status: AC
  Administered 2014-02-27: 2 via ORAL
  Filled 2014-02-27: qty 2

## 2014-02-27 MED ORDER — ALBUTEROL SULFATE HFA 108 (90 BASE) MCG/ACT IN AERS
2.0000 | INHALATION_SPRAY | Freq: Once | RESPIRATORY_TRACT | Status: AC
  Administered 2014-02-27: 2 via RESPIRATORY_TRACT
  Filled 2014-02-27: qty 6.7

## 2014-02-27 MED ORDER — PREDNISONE 20 MG PO TABS
60.0000 mg | ORAL_TABLET | Freq: Once | ORAL | Status: AC
  Administered 2014-02-27: 60 mg via ORAL
  Filled 2014-02-27: qty 3

## 2014-02-27 MED ORDER — PREDNISONE 20 MG PO TABS: 40.0000 mg | ORAL_TABLET | Freq: Every day | ORAL | Status: DC

## 2014-02-27 NOTE — ED Provider Notes (Signed)
CSN: [id number]     Arrival date & time 02/27/14  1335 History   First MD Initiated Contact with Patient 02/27/14 [phone]     Chief Complaint  Patient presents with  . Vaginal Pain  . Dysuria     (Consider location/radiation/quality/duration/timing/severity/associated sxs/prior Treatment) The history is provided by the patient and medical records. No language interpreter was used.     [name] [name] is a 39 y.o. female [phone] with a hx of PTSD, schizophrenia, HTN, asthma,  presents to the Emergency Department complaining of gradual, persistent, progressively worsening vaginal pain with dysuria onset this morning.  Pt reports that she had intense sexual intercourse last night.  She reports no regular menses since the age of 50 for unknown reasons.  Pt reports she has associated vaginl pain that radiates into her lower abd.   Pt reports she thought she was getting a yeast infection, or UTI prior.  Pt reports hx of UTIs.  She reports that he boyfriend was using hand lotion inside her vagina as well. Patient reports she's been told that her blood sugar is high in the past but she has never been diagnosed with diabetes.      Pt reports she has been sick with a URI for 2 weeks and is feeling SOB with assocaited subjective fevers. Patient reports she has not attempted to use her albuterol inhaler but has continued to have shortness of breath. Patient also endorses post tussive emesis. Exertion makes her shortness of breath and coughing worse. Nothing seems to make it better. She denies headache, neck pain, chest pain, diarrhea, weakness, dizziness, syncope.   Past Medical History  Diagnosis Date  . Bipolar 1 disorder   . PTSD (post-traumatic stress disorder)   . Schizophrenia   . Depression   . Seizures   . Hypertension   . Asthma   . Obesity   . Diabetes mellitus without complication     pt states hypoglycemic   Past Surgical History  Procedure Laterality Date  . Cholecystectomy    .  Cesarean section     No family history on file. History  Substance Use Topics  . Smoking status: Never Smoker   . Smokeless tobacco: Not on file  . Alcohol Use: Yes   OB History    No data available     Review of Systems  Constitutional: Negative for fever, diaphoresis, appetite change, fatigue and unexpected weight change.  HENT: Negative for mouth sores.   Eyes: Negative for visual disturbance.  Respiratory: Negative for cough, chest tightness, shortness of breath and wheezing.   Cardiovascular: Negative for chest pain.  Gastrointestinal: Positive for abdominal pain (Lower). Negative for nausea, vomiting, diarrhea and constipation.  Endocrine: Negative for polydipsia, polyphagia and polyuria.  Genitourinary: Positive for dysuria, hematuria and vaginal pain. Negative for urgency and frequency.  Musculoskeletal: Negative for back pain and neck stiffness.  Skin: Negative for rash.  Allergic/Immunologic: Negative for immunocompromised state.  Neurological: Negative for syncope, light-headedness and headaches.  Hematological: Does not bruise/bleed easily.  Psychiatric/Behavioral: Negative for sleep disturbance. The patient is not nervous/anxious.       Allergies  Aspirin; Bee venom; Sulfa antibiotics; and Ultram  Home Medications   Prior to Admission medications   Medication Sig Start Date End Date Taking? Authorizing Provider  albuterol (PROVENTIL HFA;VENTOLIN HFA) 108 (90 BASE) MCG/ACT inhaler Inhale 2 puffs into the lungs every 6 (six) hours as needed for wheezing or shortness of breath (SOB).    Historical Provider, MD  cephALEXin (KEFLEX) 500 MG capsule Take 1 capsule (500 mg total) by mouth 2 (two) times daily. 08/23/13   Kristen N Ward, DO  cyclobenzaprine (FLEXERIL) 10 MG tablet Take 1 tablet (10 mg total) by mouth 2 (two) times daily as needed for muscle spasms. 10/13/13   Antonietta Breach, PA-C  HYDROcodone-acetaminophen (NORCO/VICODIN) 5-325 MG per tablet Take 1-2 tablets  by mouth every 6 (six) hours as needed for moderate pain or severe pain. 02/27/14   Alila Sotero, PA-C  ibuprofen (ADVIL,MOTRIN) 200 MG tablet Take 400 mg by mouth every 6 (six) hours as needed for mild pain.    Historical Provider, MD  levETIRAcetam (KEPPRA) 500 MG tablet Take 500 mg by mouth 2 (two) times daily.    Historical Provider, MD  metroNIDAZOLE (FLAGYL) 500 MG tablet Take 1 tablet (500 mg total) by mouth 2 (two) times daily. Do not drink alcohol with this medication. 08/23/13   Kristen N Ward, DO  predniSONE (DELTASONE) 20 MG tablet Take 2 tablets (40 mg total) by mouth daily. 02/27/14   Lounell Schumacher, PA-C  trolamine salicylate (ASPERCREME) 10 % cream Apply 1 application topically as needed for muscle pain.    Historical Provider, MD   BP 106/73 mmHg  Pulse 75  Temp(Src) 97.5 F (36.4 C) (Oral)  Resp 16  SpO2 100% Physical Exam  Constitutional: She is oriented to person, place, and time. She appears well-developed and well-nourished. No distress.  HENT:  Head: Normocephalic and atraumatic.  Right Ear: Tympanic membrane, external ear and ear canal normal.  Left Ear: Tympanic membrane, external ear and ear canal normal.  Nose: Mucosal edema and rhinorrhea present. No epistaxis. Right sinus exhibits no maxillary sinus tenderness and no frontal sinus tenderness. Left sinus exhibits no maxillary sinus tenderness and no frontal sinus tenderness.  Mouth/Throat: Uvula is midline, oropharynx is clear and moist and mucous membranes are normal. Mucous membranes are not pale and not cyanotic. No oropharyngeal exudate, posterior oropharyngeal edema, posterior oropharyngeal erythema or tonsillar abscesses.  Eyes: Conjunctivae are normal. Pupils are equal, round, and reactive to light.  Neck: Normal range of motion and full passive range of motion without pain.  Cardiovascular: Normal rate, regular rhythm, normal heart sounds and intact distal pulses.   No murmur  heard. Pulmonary/Chest: Effort normal. No stridor. No respiratory distress. She has decreased breath sounds. She has wheezes. She exhibits bony tenderness. She exhibits no laceration.  Equal chest rise Decreased breath sounds throughout with inspiratory and expiratory wheezing in Patient coughing consistently with production of mucus Tenderness to palpation of the bilateral ribs and anterior chest  Abdominal: Soft. Bowel sounds are normal. There is no tenderness. There is no rebound and no guarding. Hernia confirmed negative in the right inguinal area and confirmed negative in the left inguinal area.  Genitourinary: Uterus normal. No labial fusion. There is no rash, tenderness or lesion on the right labia. There is no rash, tenderness or lesion on the left labia. Uterus is not deviated, not enlarged, not fixed and not tender. Cervix exhibits no motion tenderness, no discharge and no friability. Right adnexum displays no mass, no tenderness and no fullness. Left adnexum displays no mass, no tenderness and no fullness. No erythema, tenderness or bleeding in the vagina. No foreign body around the vagina. No signs of injury around the vagina. No vaginal discharge found.  Musculoskeletal: Normal range of motion. She exhibits no edema.  Lymphadenopathy:    She has no cervical adenopathy.       Right:  No inguinal adenopathy present.       Left: No inguinal adenopathy present.  Neurological: She is alert and oriented to person, place, and time. She exhibits normal muscle tone. Coordination normal.  Skin: Skin is warm and dry. No rash noted. She is not diaphoretic. No erythema.  Psychiatric: She has a normal mood and affect.  Nursing note and vitals reviewed.   ED Course  Procedures (including critical care time) Labs Review Labs Reviewed  WET PREP, GENITAL  URINALYSIS, ROUTINE W REFLEX MICROSCOPIC  POC URINE PREG, ED  CBG MONITORING, ED  GC/CHLAMYDIA PROBE AMP ([place])    Imaging  Review Dg Chest 2 View  02/27/2014   CLINICAL DATA:  Productive cough for 2 weeks. Chest pain. Fever. Short of breath. Asthma and bronchitis.  EXAM: CHEST  2 VIEW  COMPARISON:  Multiple priors dating back to 04/09/2013.  FINDINGS: Lung volumes are slightly lower than on the prior exam. Cardiopericardial silhouette within normal limits. Mediastinal contours normal. Trachea midline. No airspace disease or effusion.  IMPRESSION: No active cardiopulmonary disease.   Electronically Signed   By: Dereck Ligas M.D.   On: 02/27/2014 17:30     EKG Interpretation None      MDM   Final diagnoses:  Cough  SOB (shortness of breath)  Vaginal pain  Viral URI with cough   Shekia Lamore presents with vaginal pain after vigorous and extended sexual intercourse as well as URI symptoms.  Pelvic exam with pain for patient. Patient localizes pain to the labia majora and vaginal walls during the exam. No adnexal or cervical motion tenderness. No tenderness to palpation of her suprapubic right lower quadrant or left lower quadrant of her abdomen. No vaginal discharge. No blood in the vaginal vault. Wet prep without increased WBCs and UA without evidence of UTI.  Pt with irritation to the vaginal wall and vaginal wall soreness from intercourse.    Patient also with URI symptoms.  Pt CXR negative for acute infiltrate. Patients symptoms are consistent with URI, likely viral etiology. Discussed that antibiotics are not indicated for viral infections. Pt will be discharged with symptomatic treatment.  Pt given albuterol treatment here with significant improvement in lung tidal volume and cough.    I have personally reviewed patient's vitals, nursing note and any pertinent labs or imaging.  I performed an focused physical exam; undressed when appropriate .    It has been determined that no acute conditions requiring further emergency intervention are present at this time. The patient/guardian have been advised of the  diagnosis and plan. I reviewed any labs and imaging including any potential incidental findings. We have discussed signs and symptoms that warrant return to the ED and they are listed in the discharge instructions.    Vital signs are stable at discharge.   BP 106/73 mmHg  Pulse 75  Temp(Src) 97.5 F (36.4 C) (Oral)  Resp 16  SpO2 100%          Abigail Butts, PA-C 02/27/14 2337  Dot Lanes, MD 03/02/14 2152

## 2014-02-27 NOTE — ED Notes (Signed)
Sandwich given to eat

## 2014-02-27 NOTE — ED Notes (Addendum)
Pt states after intercourse this morning, she noticed blood in urine around lunchtime today and pain in her vaginal area that radiates into her abdomen as well as pain with urination. Pt alert, oriented, nad.

## 2014-02-27 NOTE — ED Notes (Signed)
Pt presents to department for evaluation of vaginal pain and dysuria. Pt reports vigorous sex with partner recently. Now states vaginal pain and blood in urine. Pt is alert and oriented x4.

## 2014-02-27 NOTE — ED Notes (Signed)
Pt states that she needs to speak with the PA about something they discussed earlier. PA Muthersbaugh made aware.

## 2014-02-27 NOTE — ED Notes (Signed)
PA at bedside.

## 2014-02-27 NOTE — Discharge Instructions (Signed)
1. Medications: vicodin for pain, prednisone and albuterol for cough, usual home medications 2. Treatment: rest, drink plenty of fluids, rest, warm soaks 3. Follow Up: Please followup with your primary doctor and OB/GYB in 3 days for discussion of your diagnoses and further evaluation after today's visit; if you do not have a primary care doctor use the resource guide provided to find one; Please return to the ER for worsening symptoms, high fevers or SOB.    Upper Respiratory Infection, Adult An upper respiratory infection (URI) is also sometimes known as the common cold. The upper respiratory tract includes the nose, sinuses, throat, trachea, and bronchi. Bronchi are the airways leading to the lungs. Most people improve within 1 week, but symptoms can last up to 2 weeks. A residual cough may last even longer.  CAUSES Many different viruses can infect the tissues lining the upper respiratory tract. The tissues become irritated and inflamed and often become very moist. Mucus production is also common. A cold is contagious. You can easily spread the virus to others by oral contact. This includes kissing, sharing a glass, coughing, or sneezing. Touching your mouth or nose and then touching a surface, which is then touched by another person, can also spread the virus. SYMPTOMS  Symptoms typically develop 1 to 3 days after you come in contact with a cold virus. Symptoms vary from person to person. They may include:  Runny nose.  Sneezing.  Nasal congestion.  Sinus irritation.  Sore throat.  Loss of voice (laryngitis).  Cough.  Fatigue.  Muscle aches.  Loss of appetite.  Headache.  Low-grade fever. DIAGNOSIS  You might diagnose your own cold based on familiar symptoms, since most people get a cold 2 to 3 times a year. Your caregiver can confirm this based on your exam. Most importantly, your caregiver can check that your symptoms are not due to another disease such as strep throat,  sinusitis, pneumonia, asthma, or epiglottitis. Blood tests, throat tests, and X-rays are not necessary to diagnose a common cold, but they may sometimes be helpful in excluding other more serious diseases. Your caregiver will decide if any further tests are required. RISKS AND COMPLICATIONS  You may be at risk for a more severe case of the common cold if you smoke cigarettes, have chronic heart disease (such as heart failure) or lung disease (such as asthma), or if you have a weakened immune system. The very young and very old are also at risk for more serious infections. Bacterial sinusitis, middle ear infections, and bacterial pneumonia can complicate the common cold. The common cold can worsen asthma and chronic obstructive pulmonary disease (COPD). Sometimes, these complications can require emergency medical care and may be life-threatening. PREVENTION  The best way to protect against getting a cold is to practice good hygiene. Avoid oral or hand contact with people with cold symptoms. Wash your hands often if contact occurs. There is no clear evidence that vitamin C, vitamin E, echinacea, or exercise reduces the chance of developing a cold. However, it is always recommended to get plenty of rest and practice good nutrition. TREATMENT  Treatment is directed at relieving symptoms. There is no cure. Antibiotics are not effective, because the infection is caused by a virus, not by bacteria. Treatment may include:  Increased fluid intake. Sports drinks offer valuable electrolytes, sugars, and fluids.  Breathing heated mist or steam (vaporizer or shower).  Eating chicken soup or other clear broths, and maintaining good nutrition.  Getting plenty of rest.  Using gargles or lozenges for comfort.  Controlling fevers with ibuprofen or acetaminophen as directed by your caregiver.  Increasing usage of your inhaler if you have asthma. Zinc gel and zinc lozenges, taken in the first 24 hours of the common  cold, can shorten the duration and lessen the severity of symptoms. Pain medicines may help with fever, muscle aches, and throat pain. A variety of non-prescription medicines are available to treat congestion and runny nose. Your caregiver can make recommendations and may suggest nasal or lung inhalers for other symptoms.  HOME CARE INSTRUCTIONS   Only take over-the-counter or prescription medicines for pain, discomfort, or fever as directed by your caregiver.  Use a warm mist humidifier or inhale steam from a shower to increase air moisture. This may keep secretions moist and make it easier to breathe.  Drink enough water and fluids to keep your urine clear or pale yellow.  Rest as needed.  Return to work when your temperature has returned to normal or as your caregiver advises. You may need to stay home longer to avoid infecting others. You can also use a face mask and careful hand washing to prevent spread of the virus. SEEK MEDICAL CARE IF:   After the first few days, you feel you are getting worse rather than better.  You need your caregiver's advice about medicines to control symptoms.  You develop chills, worsening shortness of breath, or brown or red sputum. These may be signs of pneumonia.  You develop yellow or brown nasal discharge or pain in the face, especially when you bend forward. These may be signs of sinusitis.  You develop a fever, swollen neck glands, pain with swallowing, or white areas in the back of your throat. These may be signs of strep throat. SEEK IMMEDIATE MEDICAL CARE IF:   You have a fever.  You develop severe or persistent headache, ear pain, sinus pain, or chest pain.  You develop wheezing, a prolonged cough, cough up blood, or have a change in your usual mucus (if you have chronic lung disease).  You develop sore muscles or a stiff neck. Document Released: 07/10/2000 Document Revised: 04/08/2011 Document Reviewed: 04/21/2013 [facility] Patient  Information 2015 [place], [place]. This information is not intended to replace advice given to you by your health care provider. Make sure you discuss any questions you have with your health care provider.     Emergency Department Resource Guide 1) Find a Doctor and Pay Out of Pocket Although you won't have to find out who is covered by your insurance plan, it is a good idea to ask around and get recommendations. You will then need to call the office and see if the doctor you have chosen will accept you as a new patient and what types of options they offer for patients who are self-pay. Some doctors offer discounts or will set up payment plans for their patients who do not have insurance, but you will need to ask so you aren't surprised when you get to your appointment.  2) Contact Your Local Health Department Not all health departments have doctors that can see patients for sick visits, but many do, so it is worth a call to see if yours does. If you don't know where your local health department is, you can check in your phone book. The CDC also has a tool to help you locate your state's health department, and many state websites also have listings of all of their local health departments.  3)  Find a [facility] If your illness is not likely to be very severe or complicated, you may want to try a walk in clinic. These are popping up all over the country in pharmacies, drugstores, and shopping centers. They're usually staffed by nurse practitioners or physician assistants that have been trained to treat common illnesses and complaints. They're usually fairly quick and inexpensive. However, if you have serious medical issues or chronic medical problems, these are probably not your best option.  No Primary Care Doctor: - Call Health Connect at  [phone] - they can help you locate a primary care doctor that  accepts your insurance, provides certain services, etc. - Physician Referral Service-  [phone]  Chronic Pain Problems: Organization         Address  Phone   Notes  [facility]  [phone] Patients need to be referred by their primary care doctor.   Medication Assistance: Organization         Address  Phone   Notes  [facility] Medication [facility] [address] [place], [place] [address] [phone] --Must be a resident of [facility] -- Must have NO insurance coverage whatsoever (no Medicaid/ Medicare, etc.) -- The pt. MUST have a primary care doctor that directs their care regularly and follows them in the community   [facility]ist  [phone]   [employer]  [phone]    Agencies that provide inexpensive medical care: Organization         Address  Phone   Notes  [place]  [phone]   [facility]    [phone]   [facility] [place], [place] [address] [phone]   [place] [address], [place] [phone]   Planned Parenthood    [phone]   [facility]    [phone]   [place] and [address], [place] Phone:  [phone], Fax:  [phone] Hours of Operation:  9 am - 6 pm, M-F.  Also accepts Medicaid/Medicare and self-pay.  [facility] for [place] [address], [address], [place] Phone: [phone], Fax: [phone]. Hours of Operation:  8:30 am - 5:30 pm, M-F.  Also accepts Medicaid and self-pay.  [facility] [place] [address], [place] Phone: [phone]   [place], [place], [place] [phone], Ext. [phone] Mondays & Thursdays: 7-9 AM.  First 15 patients are seen on a first come, first serve basis.    [place] Providers:  Organization         Address  Phone   Notes  [facility] [address],  [place] [phone] Also accepts self-pay patients.  [facility] [address], [place]  [phone]   [place], [address], [place] [phone]   [facility] [address] [place] [phone]   Lucianne Lei [address], [place]   [phone] Only accepts [place] Access [place] patients after they have their name applied to their card.   Self-Pay (no insurance) in [facility]:  Organization         Address  Phone   Notes  Sickle Cell Patients, [facility] [address] [phone]   [facility]gent Care [place] [phone]   [facility] [place]  [address], [place] [phone]   [facility]/Dr. Osei-Bonsu  [address], [place] or [address], [place] [phone] Phone number for both [place] and [place] locations is the same.  Urgent Medical and [facility] [address], [place] [phone]   [facility] [address] [place] or [address] [phone]   [facility] [address], [place] [phone], phone; [phone], fax Sees patients 1st and 3rd Saturday of every month.  Must not qualify for public or private insurance (i.e. Medicaid, Medicare, [place] Health Choice, Veterans' Benefits)  Household income should be no more than 200% of the poverty level The clinic cannot treat you if you are pregnant or think you are pregnant  Sexually transmitted diseases are not treated at the clinic.    Dental Care: Organization         Address  Phone  Notes  [facility] Department of [facility] [place] [phone] Accepts children up to age 83 who are enrolled in  [place] or [place]; pregnant women with a Medicaid card; and children who have applied for Medicaid or [place] Health Choice, but were declined, whose parents can pay a reduced fee at time of service.  [facility] Department of [facility]  [address], [place] [phone] Accepts children up to age 48 who are enrolled in [place] or [place]; pregnant women with a Medicaid card; and children who have applied for Medicaid or [place] Health Choice, but were declined, whose parents can pay a reduced fee at time of service.  [place] Adult Dental Access PROGRAM  [place] [phone] Patients are seen by appointment only. Walk-ins are not accepted. [place] will see patients 57 years of age and older. Monday - Tuesday (8am-5pm) Most Wednesdays (8:30-5pm) $30 per visit, cash only  [facility] Adult Dental Access PROGRAM  [address], [facility] [phone] Patients are seen by appointment only. Walk-ins are not accepted. [place] will see patients 11 years of age and older. One Wednesday Evening (Monthly: Volunteer Based).  $30 per visit, cash only  [place]  [phone] for adults; Children under age 52, call Graduate Pediatric Dentistry at [phone]. Children aged 17-14, please call [phone] to request a pediatric application.  Dental services are provided in all areas of dental care including fillings, crowns and bridges, complete and partial dentures, implants, gum treatment, root canals, and extractions. Preventive care is also provided. Treatment is provided to both adults and children. Patients are selected via a lottery and there is often a waiting list.   [facility] [address], [place]  [phone] [email or web address]   [facility] [address] [place], [place] [phone], Ext. [phone] Second and Fourth Thursday of each month, opens at 6:30  AM; Clinic ends at 9 AM.  Patients are seen on a first-come first-served basis, and a limited number are seen during each clinic.   [facility]  [address] Hillard Danker [place], [place] [phone]   Eligibility Requirements You must have lived in [place], [place],  or [place] counties for at least the last three months.   You cannot be eligible for state or federal sponsored [employer], including [employer], [place], or [employer].   You generally cannot be eligible for healthcare insurance through your employer.    How to apply: Eligibility screenings are held every Tuesday and Wednesday afternoon from 1:00 pm until 4:00 pm. You do not need an appointment for the interview!  [facility] [address], [place], [place]   [place]  [place] Department  [place]  [phone]    Behavioral Health Resources in the Community: Intensive Outpatient Programs Organization         Address  Phone  Notes  [place]. [address], [place], [place] [phone]   [facility] Outpatient [address] [place], [place]   ADS: Alcohol [facility] Drug [facility] [address] [place], [place]   [place] [address]  [place], [place] or [phone]   Substance Abuse Resources Organization         Address  Phone  Notes  Alcohol and Drug Services  [phone]   [place]  [phone]   [place]   Chinita Pester  [phone]   Residential & Outpatient Substance Abuse Program  [phone]   Psychological Services Organization         Address  Phone  Notes  [facility] [place]  [place]  [phone]   [place] [address], [place] or  [phone]    Mobile Crisis Teams Organization         Address  Phone  Notes  [facility], Mobile Crisis Care Unit  [phone]   [facility]  [address] [place], [place]   [place] Levels [address], [place] [phone]    Self-Help/Support Groups Organization         Address  Phone             Notes  [place]. of [place] - variety of support groups  [place] Call for more information  Narcotics Anonymous (NA), [facility] [address], [employer] [place]  2 meetings at this location   Special educational needs teacher         Address  Phone  Notes  [facility]idential [facility] [place],    [place]  1-(709)401-2099   [facility]  [address] [place] [address]12, [place], [place]   [place], [place] [phone] Admissions: 8am-3pm M-F  Incentives Substance [place] [address],    [place], [place] [phone]   [facility] [address] [place], [place], [place]   [facility] [address],  [place], [place]   [facility] Programs - Intensive Outpatient [address], [place], [place]   [facility] [place].,  [place], [place] or [phone]   [facility] Services (RTS) [address], [place], [place] Accepts Medicaid  [facility] [place] [address]  [place] 1-3077166632 Substance Abuse/Addiction Treatment   [facility] Resources Organization         Address  Phone  Notes  [facility]  [phone]   Domenic Schwab, PhD [address] Arlis [place], [place]   [phone] or [phone]   [place], [place] [phone]   [place] [address],  [place], [place] [phone] Insurance/Medicaid/sponsorship through [facility] and Families [address] [place]                                    [place], [place] [phone] [place] [address], [place] [phone]    Dr. Adele Schilder  [phone]   [facility] Dept. 1) [address] [place] 2) [place] 3)  [address] [address], [place] [phone]  [phone]   [place] [phone] or [phone] (After Hours)

## 2014-02-28 LAB — GC/CHLAMYDIA PROBE AMP (~~LOC~~) NOT AT ARMC
CHLAMYDIA, DNA PROBE: NEGATIVE
NEISSERIA GONORRHEA: NEGATIVE

## 2014-04-14 IMAGING — CR DG TIBIA/FIBULA 2V*L*
4 series · 4 of 4 positions shown · non-contrast
Comparison: DG ANKLE COMPLETE*L* dated 03/01/2013; DG KNEE COMPLETE 4
VIEWS*L* dated 03/01/2013

CLINICAL DATA: Injury.  Leg pain.

EXAM:
LEFT TIBIA AND FIBULA - 2 VIEW

[t tib/fib ap left * (1 of 2)]
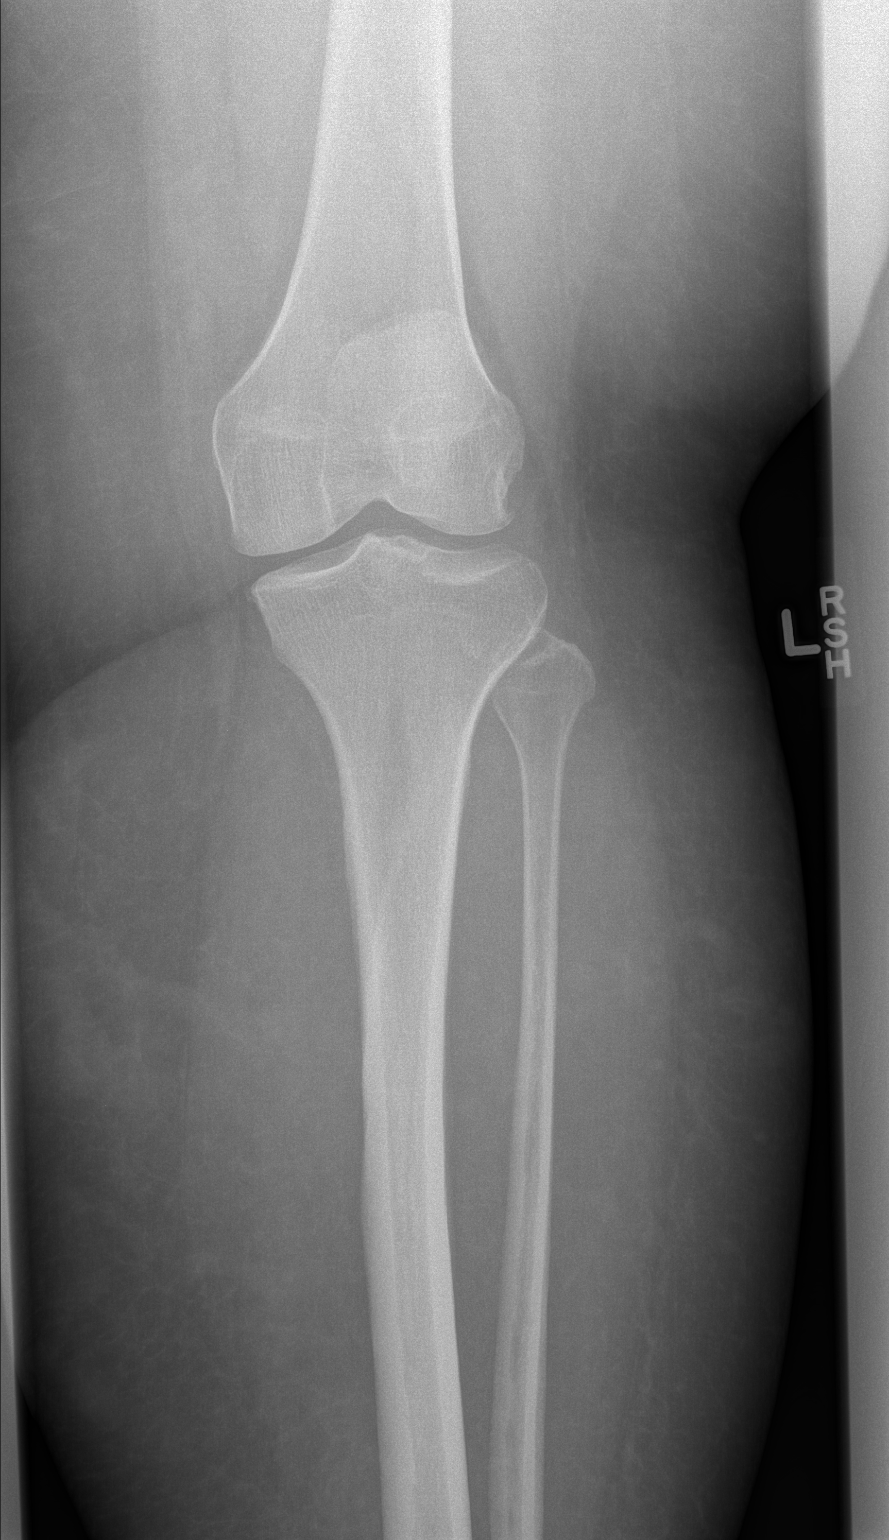

[t tib/fib ap left * (2 of 2)]
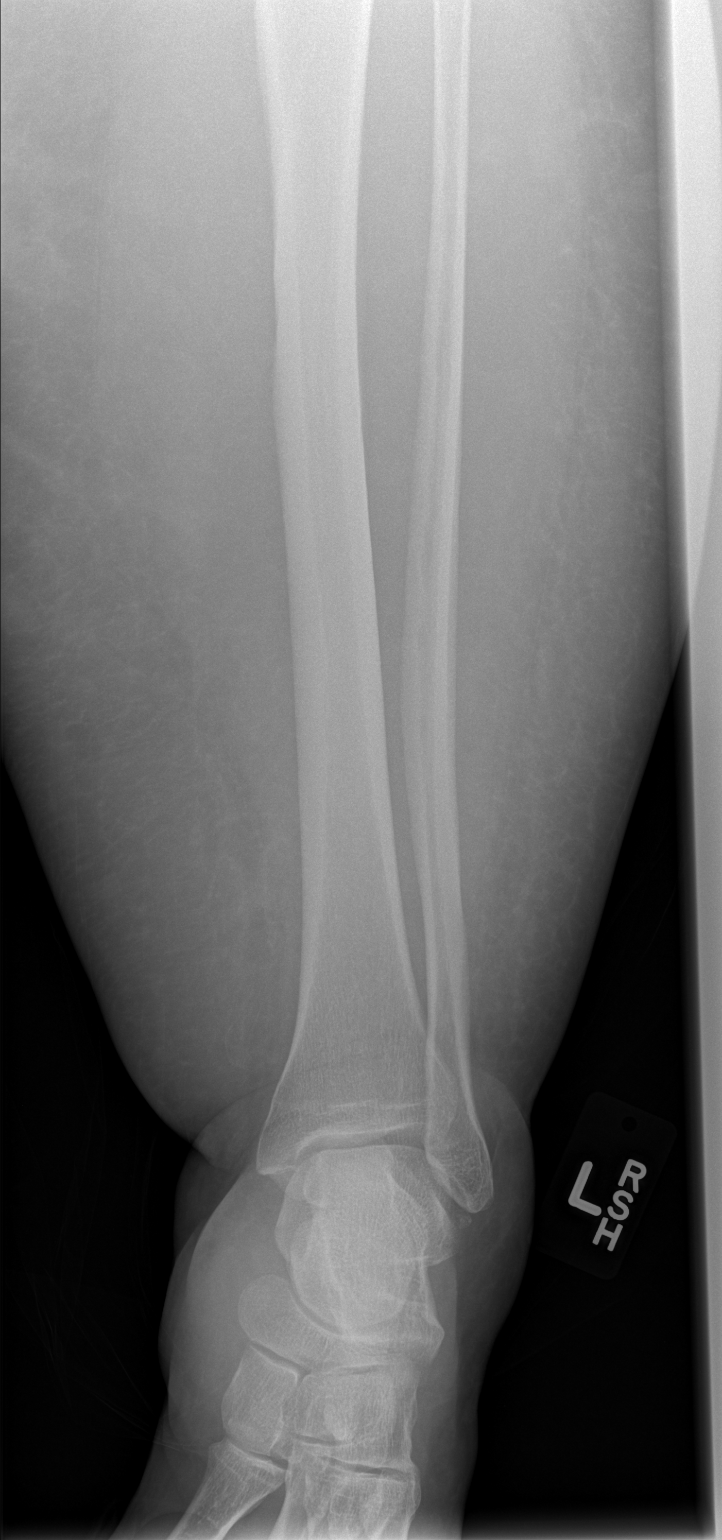

[t tib/fib lat left * (1 of 2)]
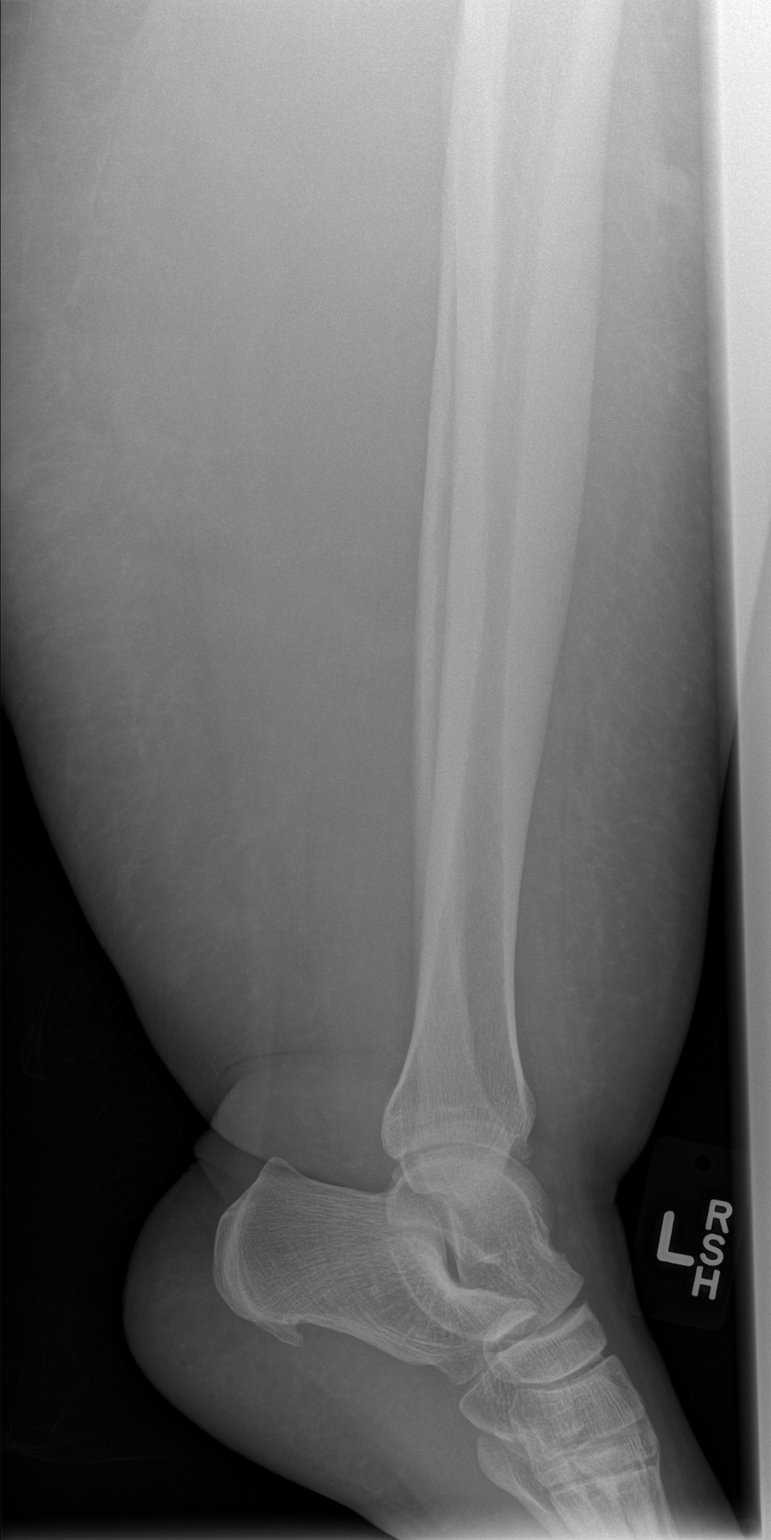

[t tib/fib lat left * (2 of 2)]
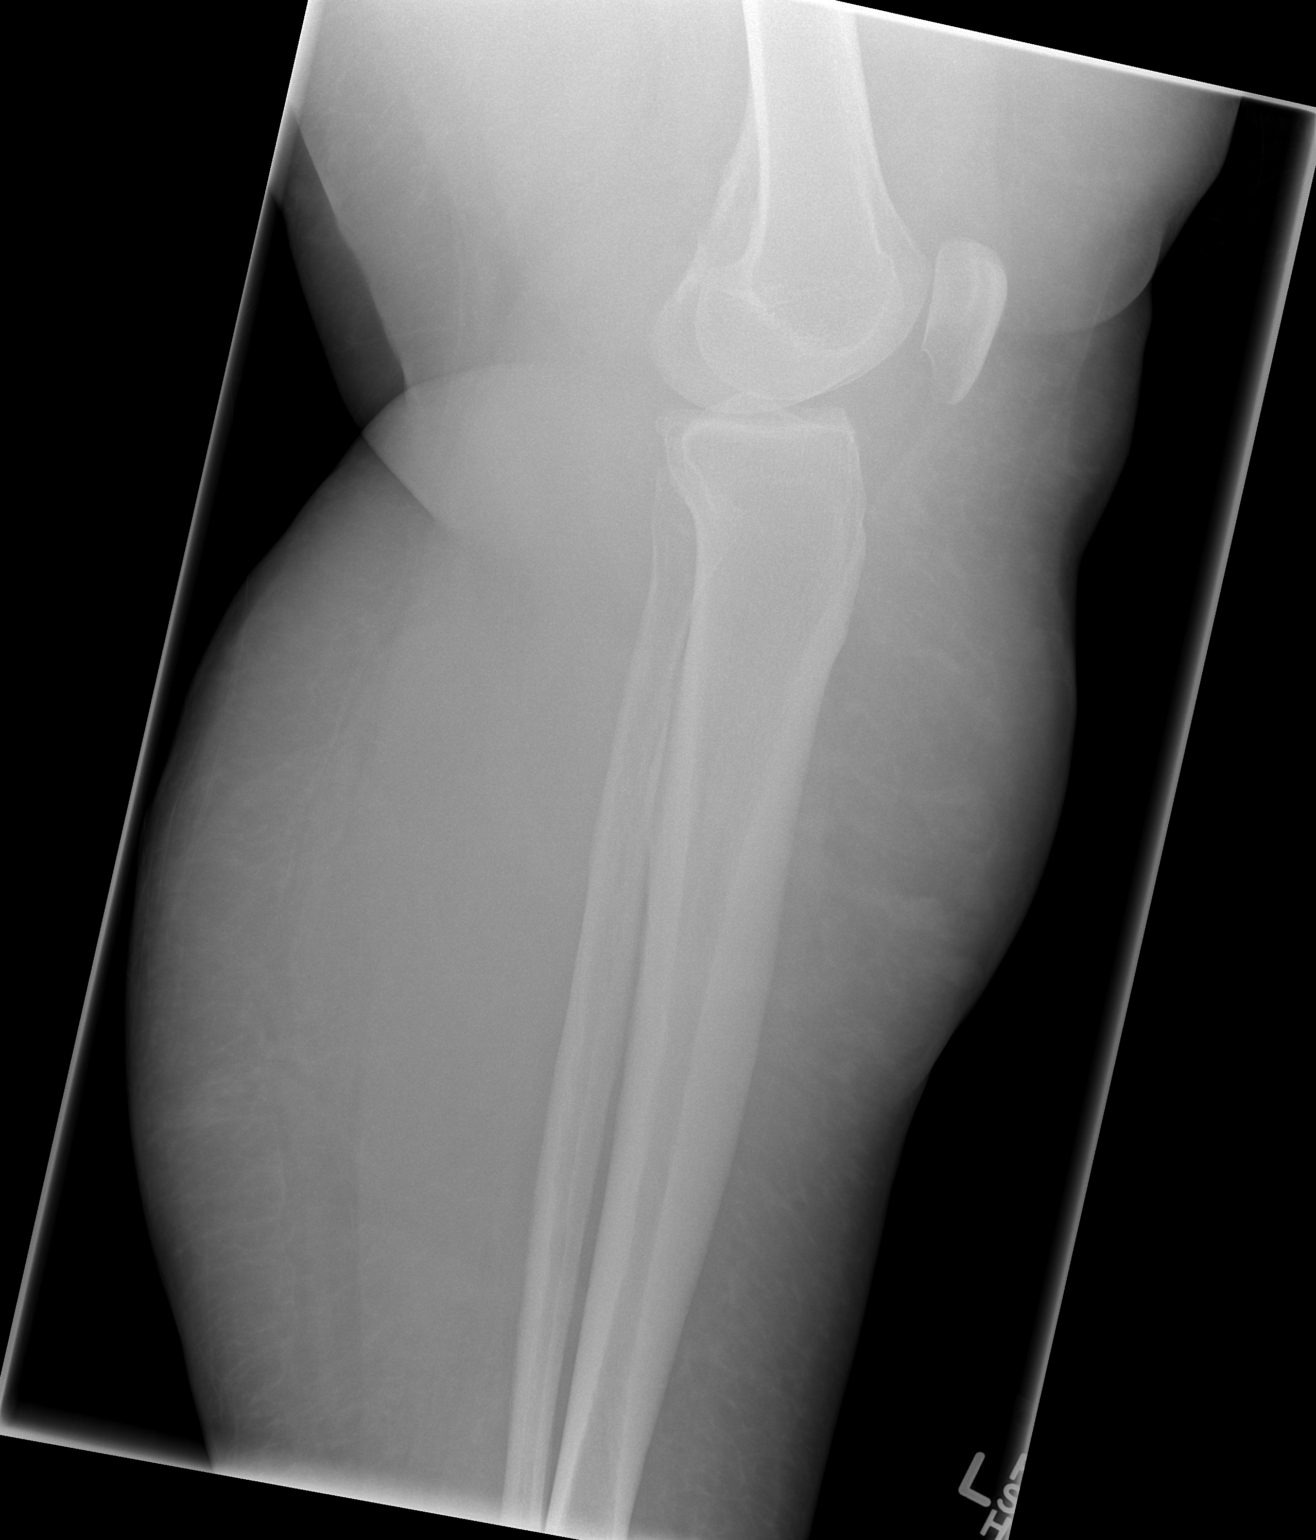

[4 of 4 positions shown; findings below may reference images not displayed]

FINDINGS: Marginal spurring in the knee. Stable small well corticated bony
structure below the lateral malleolus. Plantar calcaneal spur. Mild
spurring of the distal anterior rim of the tibia.
IMPRESSION: 1. No acute bony findings.
2. Plantar calcaneal spur.
3. Degenerative spurring in the knee.

## 2014-04-26 ENCOUNTER — Emergency Department (HOSPITAL_COMMUNITY)
Admission: EM | Admit: 2014-04-26 | Discharge: 2014-04-27 | Disposition: A | Discharge status: Home / Self Care | Payer: Self-pay | Attending: Emergency Medicine | Admitting: Emergency Medicine

## 2014-04-26 ENCOUNTER — Emergency Department (HOSPITAL_COMMUNITY): Payer: Self-pay

## 2014-04-26 ENCOUNTER — Encounter (HOSPITAL_COMMUNITY): Payer: Self-pay | Admitting: Emergency Medicine

## 2014-04-26 DIAGNOSIS — F419 Anxiety disorder, unspecified: Secondary | ICD-10-CM | POA: Insufficient documentation

## 2014-04-26 DIAGNOSIS — Z792 Long term (current) use of antibiotics: Secondary | ICD-10-CM | POA: Insufficient documentation

## 2014-04-26 DIAGNOSIS — R079 Chest pain, unspecified: Secondary | ICD-10-CM | POA: Insufficient documentation

## 2014-04-26 DIAGNOSIS — N390 Urinary tract infection, site not specified: Secondary | ICD-10-CM | POA: Insufficient documentation

## 2014-04-26 DIAGNOSIS — J45901 Unspecified asthma with (acute) exacerbation: Secondary | ICD-10-CM | POA: Insufficient documentation

## 2014-04-26 DIAGNOSIS — Z859 Personal history of malignant neoplasm, unspecified: Secondary | ICD-10-CM | POA: Insufficient documentation

## 2014-04-26 DIAGNOSIS — Z3202 Encounter for pregnancy test, result negative: Secondary | ICD-10-CM | POA: Insufficient documentation

## 2014-04-26 DIAGNOSIS — R110 Nausea: Secondary | ICD-10-CM | POA: Insufficient documentation

## 2014-04-26 DIAGNOSIS — Z86718 Personal history of other venous thrombosis and embolism: Secondary | ICD-10-CM | POA: Insufficient documentation

## 2014-04-26 DIAGNOSIS — Z8659 Personal history of other mental and behavioral disorders: Secondary | ICD-10-CM | POA: Insufficient documentation

## 2014-04-26 DIAGNOSIS — Z7952 Long term (current) use of systemic steroids: Secondary | ICD-10-CM | POA: Insufficient documentation

## 2014-04-26 DIAGNOSIS — I509 Heart failure, unspecified: Secondary | ICD-10-CM | POA: Insufficient documentation

## 2014-04-26 DIAGNOSIS — I10 Essential (primary) hypertension: Secondary | ICD-10-CM | POA: Insufficient documentation

## 2014-04-26 HISTORY — DX: Malignant (primary) neoplasm, unspecified: C80.1

## 2014-04-26 HISTORY — DX: Heart failure, unspecified: I50.9

## 2014-04-26 LAB — CBC WITH DIFFERENTIAL/PLATELET
Basophils Absolute: 0 10*3/uL (ref 0.0–0.1)
Basophils Relative: 1 % (ref 0–1)
EOS ABS: 0.1 10*3/uL (ref 0.0–0.7)
Eosinophils Relative: 1 % (ref 0–5)
HCT: 42.2 % (ref 36.0–46.0)
HEMOGLOBIN: 14.6 g/dL (ref 12.0–15.0)
Lymphocytes Relative: 33 % (ref 12–46)
Lymphs Abs: 2.8 10*3/uL (ref 0.7–4.0)
MCH: 30.3 pg (ref 26.0–34.0)
MCHC: 34.6 g/dL (ref 30.0–36.0)
MCV: 87.6 fL (ref 78.0–100.0)
MONO ABS: 0.4 10*3/uL (ref 0.1–1.0)
Monocytes Relative: 5 % (ref 3–12)
Neutro Abs: 5.3 10*3/uL (ref 1.7–7.7)
Neutrophils Relative %: 60 % (ref 43–77)
PLATELETS: 253 10*3/uL (ref 150–400)
RBC: 4.82 MIL/uL (ref 3.87–5.11)
RDW: 13 % (ref 11.5–15.5)
WBC: 8.7 10*3/uL (ref 4.0–10.5)

## 2014-04-26 LAB — POC URINE PREG, ED: Preg Test, Ur: NEGATIVE

## 2014-04-26 LAB — BASIC METABOLIC PANEL
Anion gap: 12 (ref 5–15)
BUN: 20 mg/dL (ref 6–23)
CO2: 22 mmol/L (ref 19–32)
Calcium: 9.6 mg/dL (ref 8.4–10.5)
Chloride: 106 mmol/L (ref 96–112)
Creatinine, Ser: 0.76 mg/dL (ref 0.50–1.10)
GFR calc Af Amer: >90 mL/min (ref >90)
GFR calc non Af Amer: >90 mL/min (ref >90)
GLUCOSE: 90 mg/dL (ref 70–99)
POTASSIUM: 4.4 mmol/L (ref 3.5–5.1)
Sodium: 140 mmol/L (ref 135–145)

## 2014-04-26 LAB — URINALYSIS, ROUTINE W REFLEX MICROSCOPIC
BILIRUBIN URINE: NEGATIVE
GLUCOSE, UA: NEGATIVE mg/dL
Ketones, ur: NEGATIVE mg/dL
Nitrite: POSITIVE — AB
PROTEIN: NEGATIVE mg/dL
Specific Gravity, Urine: 1.03 (ref 1.005–1.030)
UROBILINOGEN UA: 0.2 mg/dL (ref 0.0–1.0)
pH: 5.5 (ref 5.0–8.0)

## 2014-04-26 LAB — D-DIMER, QUANTITATIVE (NOT AT ARMC): D DIMER QUANT: 0.5 ug{FEU}/mL — AB (ref 0.00–0.48)

## 2014-04-26 LAB — URINE MICROSCOPIC-ADD ON

## 2014-04-26 LAB — I-STAT TROPONIN, ED: TROPONIN I, POC: 0 ng/mL (ref 0.00–0.08)

## 2014-04-26 LAB — BRAIN NATRIURETIC PEPTIDE: B NATRIURETIC PEPTIDE 5: 60.4 pg/mL (ref 0.0–100.0)

## 2014-04-26 MED ORDER — ONDANSETRON HCL 4 MG/2ML IJ SOLN
4.0000 mg | Freq: Once | INTRAMUSCULAR | Status: AC
  Administered 2014-04-26: 4 mg via INTRAVENOUS
  Filled 2014-04-26: qty 2

## 2014-04-26 MED ORDER — NAPROXEN 500 MG PO TABS: 500.0000 mg | ORAL_TABLET | Freq: Two times a day (BID) | ORAL | Status: DC

## 2014-04-26 MED ORDER — ONDANSETRON 4 MG PO TBDP
4.0000 mg | ORAL_TABLET | Freq: Once | ORAL | Status: AC
  Administered 2014-04-26: 4 mg via ORAL
  Filled 2014-04-26: qty 1

## 2014-04-26 MED ORDER — IOHEXOL 350 MG/ML SOLN
100.0000 mL | Freq: Once | INTRAVENOUS | Status: AC | PRN
  Administered 2014-04-26: 100 mL via INTRAVENOUS

## 2014-04-26 MED ORDER — OXYCODONE-ACETAMINOPHEN 5-325 MG PO TABS
2.0000 | ORAL_TABLET | Freq: Once | ORAL | Status: AC
  Administered 2014-04-26: 2 via ORAL
  Filled 2014-04-26: qty 2

## 2014-04-26 NOTE — ED Notes (Signed)
Called into room.  Patient crying out.  States I need a breathing treatment it helped me last time.  Physician notified of request and continued c/o pain.  Reassured patient.  No longer crying out.

## 2014-04-26 NOTE — ED Notes (Signed)
Pt taken to the bathroom and wants to give an urine specimen; would like her urine checked

## 2014-04-26 NOTE — ED Provider Notes (Signed)
CSN: [id number]     Arrival date & time 04/26/14  1624 History   First MD Initiated Contact with Patient 04/26/14 1936     Chief Complaint  Patient presents with  . Chest Pain  . Shortness of Breath     (Consider location/radiation/quality/duration/timing/severity/associated sxs/prior Treatment) The history is provided by the patient. No language interpreter was used.  [name] [name] is a 39 y.o [place] female with a history of seizures, htn, asthma, and  obesity who presents with chest pain and shortness of breath for the past 2 days.  She states the pain is intermittent and last 1-2 minutes and feels like a pressure or heaviness. Deep breaths make it worse.  Nothing makes it better. She states her pain is 8/10 now. She states she recently had a DVT at [facility] but was not sent home on anticoagulation. She denies fever, vomiting, abdominal pain, or leg pain.     Past Medical History  Diagnosis Date  . Bipolar 1 disorder   . PTSD (post-traumatic stress disorder)   . Schizophrenia   . Depression   . Seizures   . Hypertension   . Asthma   . Obesity   . Cancer   . CHF (congestive heart failure)    Past Surgical History  Procedure Laterality Date  . Cholecystectomy    . Cesarean section     History reviewed. No pertinent family history. History  Substance Use Topics  . Smoking status: Never Smoker   . Smokeless tobacco: Not on file  . Alcohol Use: Yes   OB History    No data available     Review of Systems  Cardiovascular: Negative for palpitations and leg swelling.  Gastrointestinal: Positive for nausea.  Neurological: Negative for seizures, syncope, weakness, numbness and headaches.  Psychiatric/Behavioral: The patient is nervous/anxious.     Allergies  Aspirin; Bee venom; Sulfa antibiotics; and Ultram  Home Medications   Prior to Admission medications   Medication Sig Start Date End Date Taking? Authorizing Provider  albuterol (PROVENTIL HFA;VENTOLIN  HFA) 108 (90 BASE) MCG/ACT inhaler Inhale 2 puffs into the lungs every 6 (six) hours as needed for wheezing or shortness of breath (SOB).   Yes Historical Provider, MD  cyclobenzaprine (FLEXERIL) 10 MG tablet Take 1 tablet (10 mg total) by mouth 2 (two) times daily as needed for muscle spasms. 10/13/13  Yes Antonietta Breach, PA-C  diphenhydrAMINE (BENADRYL) 25 MG tablet Take 25 mg by mouth every 6 (six) hours as needed for allergies.   Yes Historical Provider, MD  DM-Doxylamine-Acetaminophen (NYQUIL COLD & FLU PO) Take 30 mLs by mouth daily as needed (for pain).   Yes Historical Provider, MD  ibuprofen (ADVIL,MOTRIN) 200 MG tablet Take 400 mg by mouth every 6 (six) hours as needed.   Yes Historical Provider, MD  naproxen sodium (ANAPROX) 220 MG tablet Take 220 mg by mouth 2 (two) times daily as needed (for pain).   Yes Historical Provider, MD  cephALEXin (KEFLEX) 500 MG capsule Take 1 capsule (500 mg total) by mouth 2 (two) times daily. 08/23/13   Kristen N Ward, DO  HYDROcodone-acetaminophen (NORCO/VICODIN) 5-325 MG per tablet Take 1-2 tablets by mouth every 6 (six) hours as needed for moderate pain or severe pain. 02/27/14   Hannah Muthersbaugh, PA-C  metroNIDAZOLE (FLAGYL) 500 MG tablet Take 1 tablet (500 mg total) by mouth 2 (two) times daily. Do not drink alcohol with this medication. 08/23/13   Kristen N Ward, DO  naproxen (NAPROSYN) 500 MG tablet  Take 1 tablet (500 mg total) by mouth 2 (two) times daily. 04/26/14   Nydia Ytuarte Patel-Mills, PA-C  predniSONE (DELTASONE) 20 MG tablet Take 2 tablets (40 mg total) by mouth daily. 02/27/14   Hannah Muthersbaugh, PA-C   BP 117/55 mmHg  Pulse 63  Temp(Src) 98.7 F (37.1 C) (Oral)  Resp 15  SpO2 97% Physical Exam  Constitutional: She is oriented to person, place, and time. She appears well-developed and well-nourished.  Morbid obesity.  HENT:  Head: Normocephalic and atraumatic.  Eyes: Conjunctivae are normal.  Neck: Normal range of motion. Neck supple.   Cardiovascular: Normal rate, regular rhythm and normal heart sounds.   Pulmonary/Chest: Effort normal and breath sounds normal. She has no wheezes. She exhibits no tenderness.  Abdominal: Soft. There is no tenderness.  Musculoskeletal: Normal range of motion.  Neurological: She is alert and oriented to person, place, and time.  Skin: Skin is warm and dry.  Nursing note and vitals reviewed.   ED Course  Procedures (including critical care time) Labs Review Labs Reviewed  URINALYSIS, ROUTINE W REFLEX MICROSCOPIC - Abnormal; Notable for the following:    Color, Urine AMBER (*)    APPearance TURBID (*)    Hgb urine dipstick SMALL (*)    Nitrite POSITIVE (*)    Leukocytes, UA SMALL (*)    All other components within normal limits  URINE MICROSCOPIC-ADD ON - Abnormal; Notable for the following:    Squamous Epithelial / LPF MANY (*)    Bacteria, UA MANY (*)    Casts HYALINE CASTS (*)    All other components within normal limits  D-DIMER, QUANTITATIVE - Abnormal; Notable for the following:    D-Dimer, Quant 0.50 (*)    All other components within normal limits  URINE CULTURE  BASIC METABOLIC PANEL  BRAIN NATRIURETIC PEPTIDE  CBC WITH DIFFERENTIAL/PLATELET  Randolm Idol, ED  POC URINE PREG, ED    Imaging Review Dg Chest 2 View  04/26/2014   CLINICAL DATA:  Chest pain and shortness of breath, symptoms for 24 hr.  EXAM: CHEST  2 VIEW  COMPARISON:  02/27/2014  FINDINGS: The cardiomediastinal contours are normal. The lungs are clear. Pulmonary vasculature is normal. No consolidation, pleural effusion, or pneumothorax. No acute osseous abnormalities are seen.  IMPRESSION: No acute pulmonary process.   Electronically Signed   By: Jeb Levering M.D.   On: 04/26/2014 17:31     EKG Interpretation   Date/Time:  Tuesday April 26 2014 16:30:40 EDT Ventricular Rate:  76 PR Interval:  146 QRS Duration: 80 QT Interval:  386 QTC Calculation: 434 R Axis:   -126 Text Interpretation:   Normal sinus rhythm Right superior axis deviation  Anterior infarct , age undetermined Abnormal ECG No significant change  since last tracing Confirmed by KNAPP  MD-J, JON ([id number]) on 04/26/2014  7:36:40 PM      MDM   Final diagnoses:  Urinary tract infection, acute  Chest pain, unspecified chest pain type  Patient presents for chest pain and shortness of breath for the past 2 days that is intermittent and lasting 1-2 minutes.  She states she was diagnosed with a DVT 3 weeks ago at [facility] but there is no record of her being there and she says she was not sent home on anticoagulation.   Her UA is contaminated but I did get a culture and explained that we would call her if the culture was positive.  I have not given her antibiotics to go home with  today since she is asymptomatic. All labs are unremarkable.  CXR shows no pneumonia, no pleural effusion, and no pneumothorax.  Her troponin is negative.  I did a d-dimer to r/o PE.  The dimer is .50.  We attempted to get a CTA but the patients IV blew twice.  We agreed that the patient should follow up with her pcp and that a VQ scan was not necessary at this time. She is not tachycardic. Her oxygen saturation is running in the upper 90's.   I have given her the resource guide to f/u.  She agrees with the plan.      Ottie Glazier, PA-C 04/27/14 0022

## 2014-04-26 NOTE — ED Notes (Signed)
Pt sts CP/SOB; pt hyperventilating at present; pt sts pain in back and HA

## 2014-04-26 NOTE — ED Provider Notes (Signed)
Pt gas atypical chest pain.  Her history is questionable.  She states she had a PE at [facility] but was sent home on no medications.  We checked care everywhere but there are no records available.    D dimer is 0.50 but we were unable to get adequate IV acess for a CT angio.  I do not feel that she needs a vq scan.    Pt is somewhat anxious.  This could be contributing factor.    Medical screening examination/treatment/procedure(s) were conducted as a shared visit with non-physician practitioner(s) and myself.  I personally evaluated the patient during the encounter.   EKG Interpretation   Date/Time:  Tuesday April 26 2014 16:30:40 EDT Ventricular Rate:  76 PR Interval:  146 QRS Duration: 80 QT Interval:  386 QTC Calculation: 434 R Axis:   -126 Text Interpretation:  Normal sinus rhythm Right superior axis deviation  Anterior infarct , age undetermined Abnormal ECG No significant change  since last tracing Confirmed by Vicky Mccanless  MD-J, Dearia Wilmouth [phone]) on 04/26/2014  7:36:40 PM        [name] Rank, MD 04/26/14 [phone]

## 2014-04-26 NOTE — Discharge Instructions (Signed)

## 2014-04-29 ENCOUNTER — Telehealth (HOSPITAL_BASED_OUTPATIENT_CLINIC_OR_DEPARTMENT_OTHER): Payer: Self-pay | Admitting: Emergency Medicine

## 2014-04-29 LAB — URINE CULTURE: Colony Count: >=100000

## 2014-04-29 NOTE — Telephone Encounter (Signed)
Post ED Visit - Positive Culture Follow-up  Culture report reviewed by antimicrobial stewardship pharmacist: []  Wes [place], Pharm.D., BCPS []  Heide Guile, Pharm.D., BCPS []  Alycia Rossetti, Pharm.D., BCPS [x]  [place], [place].D., BCPS, AAHIVP []  Legrand Como, Pharm.D., BCPS, AAHIVP []  Isac Sarna, Pharm.D., BCPS  Positive urine culture E. Coli Treated with none,asymptomatic,and no further patient follow-up is required at this time.  Hazle Nordmann 04/29/2014, 12:52 PM

## 2014-06-21 IMAGING — CR DG ANKLE COMPLETE 3+V*L*
3 series · 3 of 3 positions shown · non-contrast
Comparison: Left ankle series of May 25, 2013

CLINICAL DATA: Left ankle pain status post trauma

EXAM:
LEFT ANKLE COMPLETE - 3+ VIEW

[x ankle ap left]
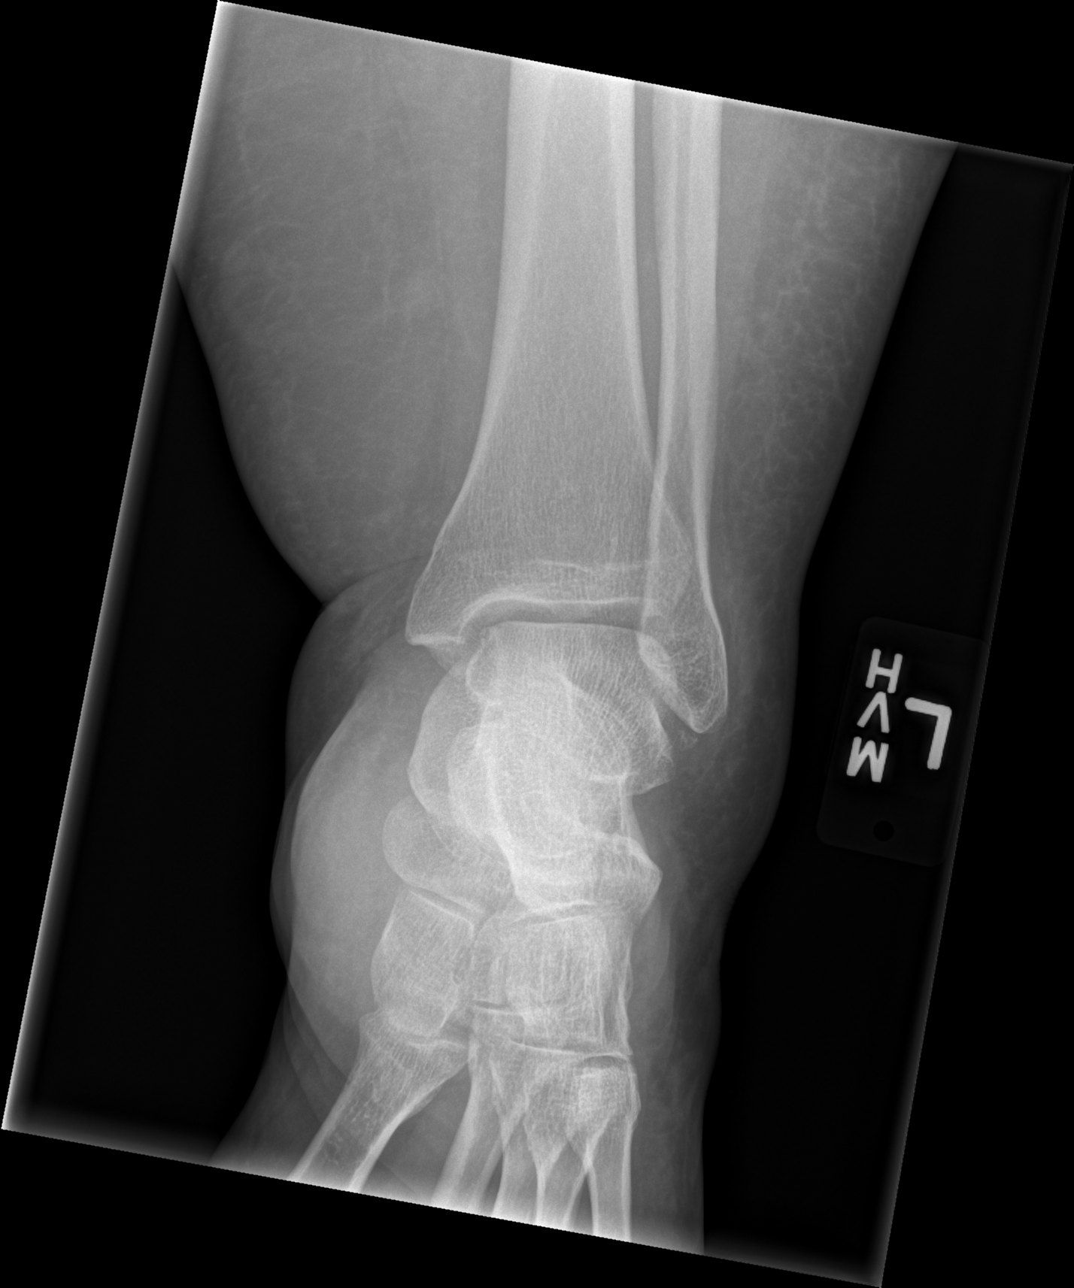

[x ankle obl left]
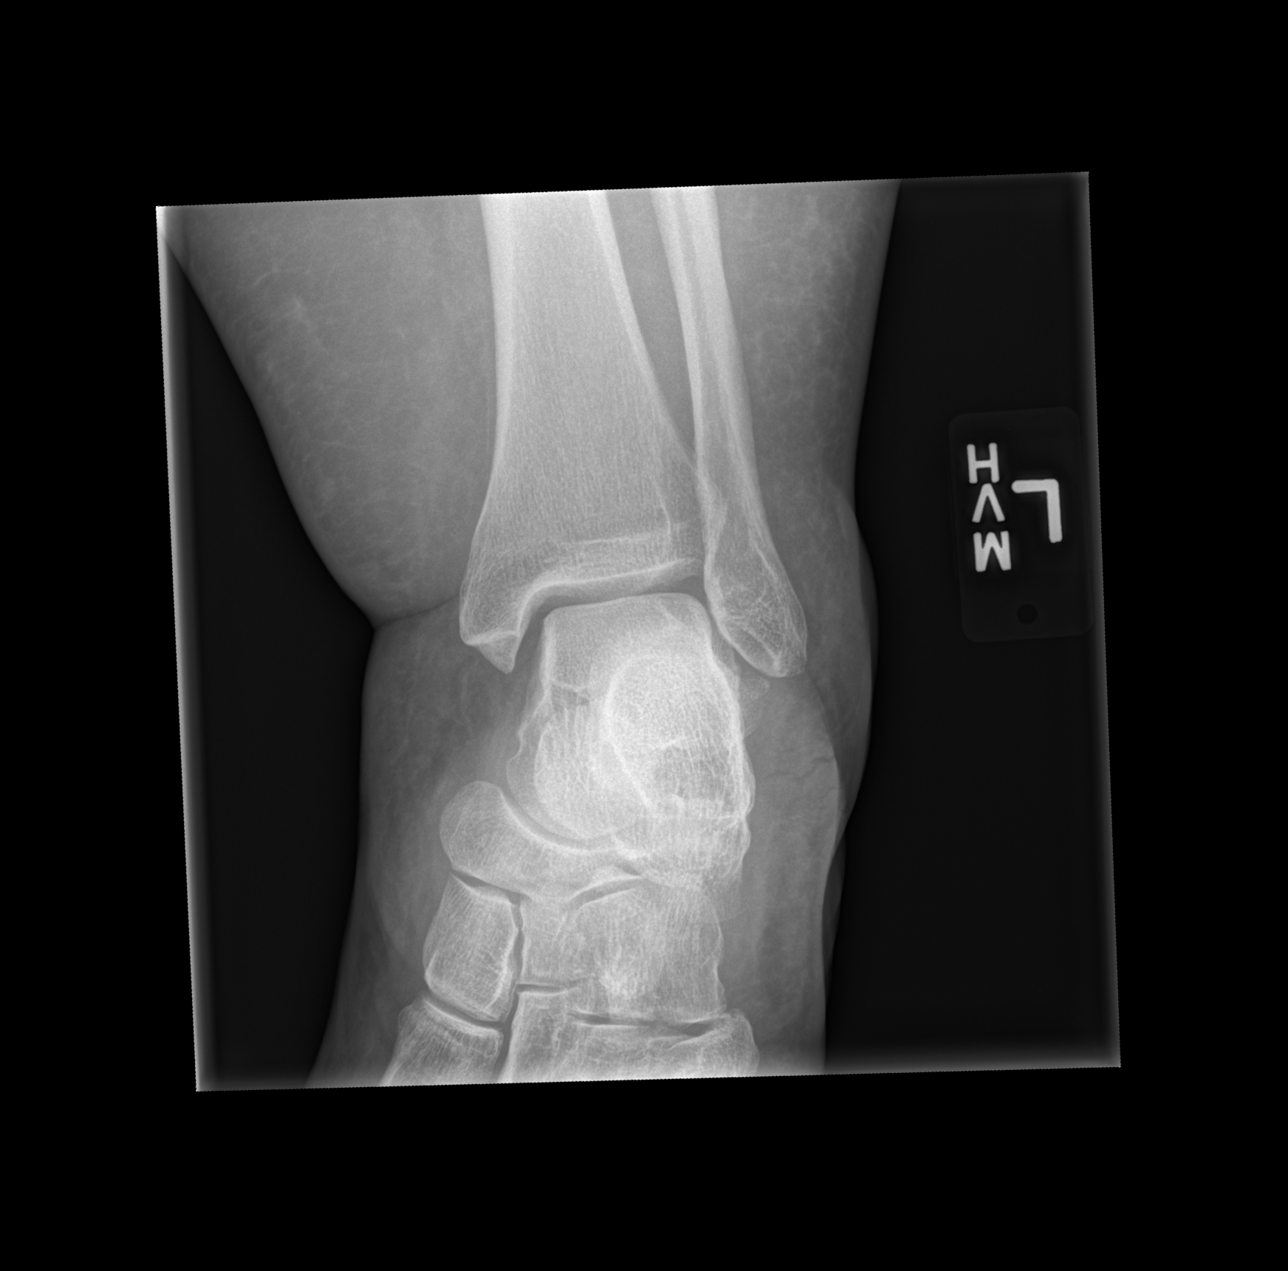

[x ankle lat left]
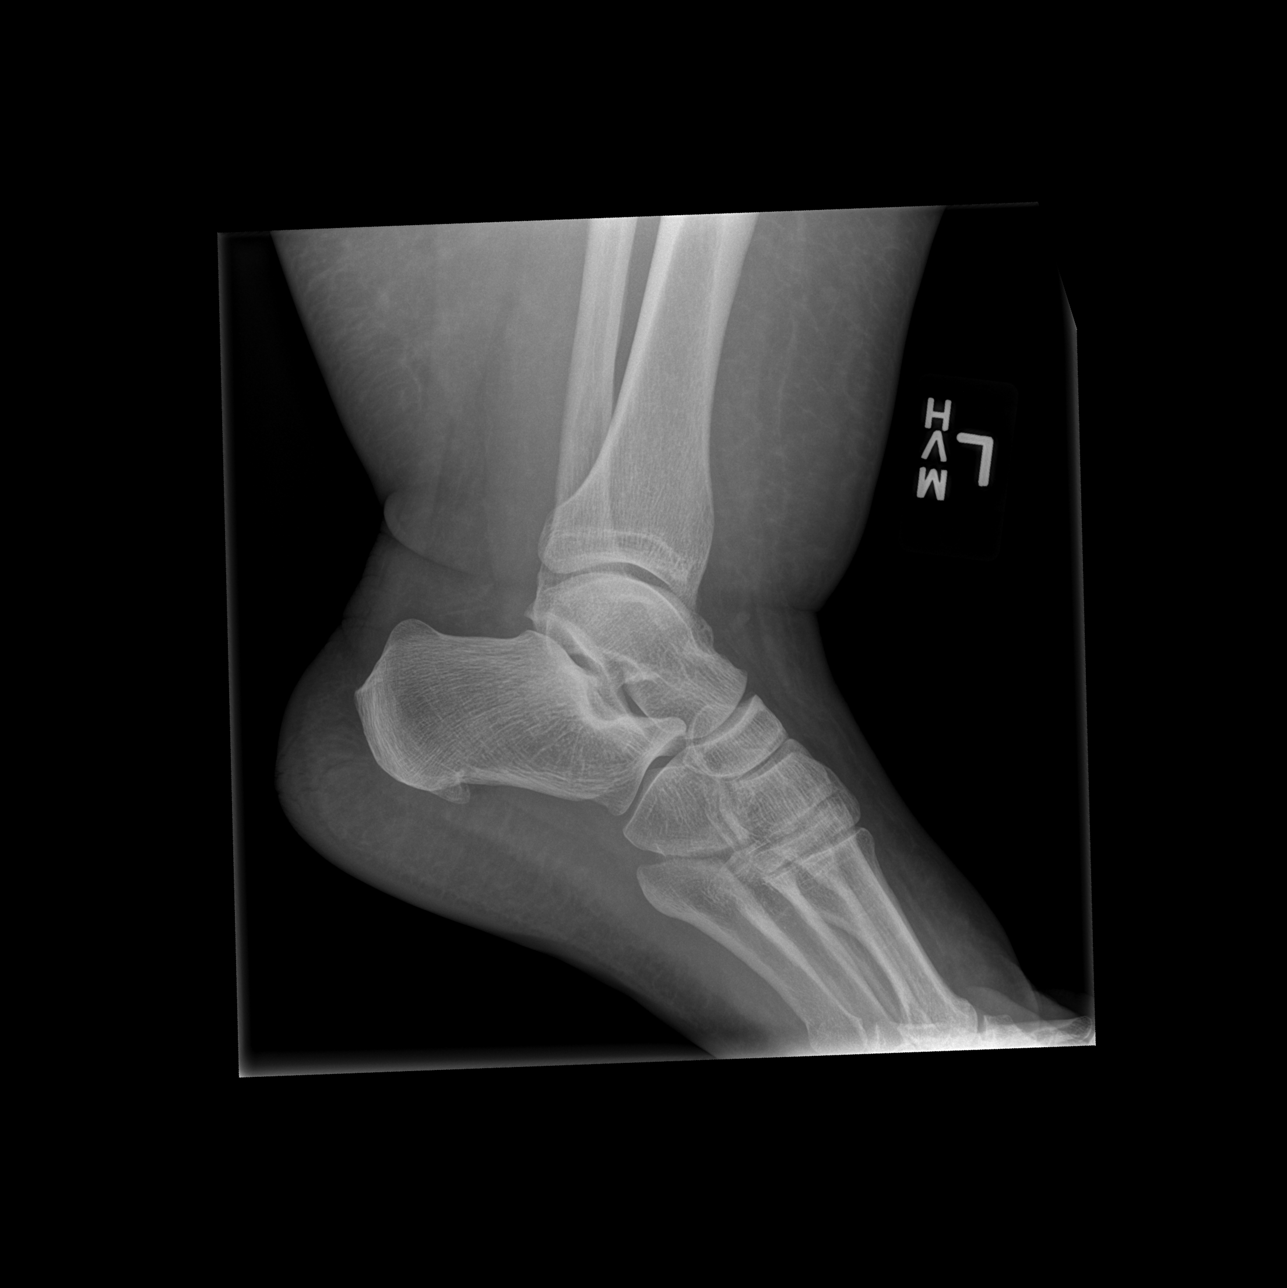

[3 of 3 positions shown; findings below may reference images not displayed]

FINDINGS: There is moderate diffuse soft tissue swelling. There is a tiny bony
density inferior to the tip of the lateral malleolus unchanged from
the previous study. The ankle joint mortise is reasonably well
maintained. The medial and posterior malleoli are intact. The tailor
dome is intact. There is a moderate-sized plantar calcaneal spur.
The calcaneus itself appears intact. The other tarsal bones appear
intact where visualized.
IMPRESSION: There is bony density inferior and slightly medial to the tip of the
lateral malleolus which was demonstrated on the previous study and
was felt to likely be the sequelae of a previous injury. No acute
fracture of the left ankle is demonstrated. There is mild diffuse
but stable appearing soft tissue swelling.

## 2014-08-05 ENCOUNTER — Emergency Department
Admission: EM | Admit: 2014-08-05 | Discharge status: Against Medical Advice | Payer: Self-pay | Source: Ambulatory Visit

## 2014-08-05 LAB — HM HIV SCREENING OFFERED

## 2014-08-05 NOTE — First Provider Contact (Signed)
ED Medical Screening Exam Note    Initial provider evaluation performed by   ED First Provider Contact     Date/Time Event User Comments    08/05/14 [phone] ED Provider First Contact Lucero Auzenne Initial Face to Face Provider Contact        Patient is a 39yo female who presents to the ED with complaints of a laceration on the bottom of her left foot, pain with urination and a boil on her rectum. Patient states that she has also had a fever on/off for the past 2 days.    Vital signs reviewed.    Orders placed:  LABS     Patient requires further evaluation.     Zack SealHEIDI M Reymundo Winship, NP, 08/05/2014, 12:11 PM    Supervising physician Dr. Carloyn MannerPeter Crane was immediately available     Zack SealSpringer, Jonella Redditt M, NP  08/05/14 1215

## 2014-08-05 NOTE — ED Triage Notes (Signed)
Presents with several complaints. C/o fever, chills and pain with urination. Also states that she has an abscess near her rectum. Also states that she has a laceration on her left foot with a hx. Diabetes.Triage Note       Kristina Perez Kristina K Carnita Golob, RN

## 2014-08-06 ENCOUNTER — Encounter: Payer: Self-pay | Admitting: Emergency Medicine

## 2014-08-06 ENCOUNTER — Emergency Department
Admission: EM | Admit: 2014-08-06 | Disposition: A | Discharge status: Home / Self Care | Payer: Self-pay | Source: Ambulatory Visit | Attending: Emergency Medicine | Admitting: Emergency Medicine

## 2014-08-06 LAB — URINE MICROSCOPIC (IQ200)

## 2014-08-06 LAB — CBC AND DIFFERENTIAL
Baso # K/uL: 0.1 10*3/uL (ref 0.0–0.1)
Basophil %: 0.8 %
Eos # K/uL: 0.1 10*3/uL (ref 0.0–0.4)
Eosinophil %: 1.3 %
Hematocrit: 38 % (ref 34–45)
Hemoglobin: 13 g/dL (ref 11.2–15.7)
IMM Granulocytes #: 0 10*3/uL (ref 0.0–0.1)
IMM Granulocytes: 0.2 %
Lymph # K/uL: 2.9 10*3/uL (ref 1.2–3.7)
Lymphocyte %: 34.4 %
MCH: 30 pg (ref 26–32)
MCHC: 34 g/dL (ref 32–36)
MCV: 90 fL (ref 79–95)
Mono # K/uL: 0.6 10*3/uL (ref 0.2–0.9)
Monocyte %: 6.8 %
Neut # K/uL: 4.7 10*3/uL (ref 1.6–6.1)
Nucl RBC # K/uL: 0 10*3/uL (ref 0.0–0.0)
Nucl RBC %: 0 /100 WBC (ref 0.0–0.2)
Platelets: 238 10*3/uL (ref 160–370)
RBC: 4.3 MIL/uL (ref 3.9–5.2)
RDW: 12.9 % (ref 11.7–14.4)
Seg Neut %: 56.5 %
WBC: 8.3 10*3/uL (ref 4.0–10.0)

## 2014-08-06 LAB — POCT URINALYSIS DIPSTICK
Blood,UA POCT: NEGATIVE
Glucose,UA POCT: NORMAL
Ketones,UA POCT: NEGATIVE
Lot #: 20642804
Nitrite,UA POCT: POSITIVE — AB
PH,UA POCT: 5 (ref 5–8)

## 2014-08-06 LAB — BASIC METABOLIC PANEL
Anion Gap: 14 (ref 7–16)
CO2: 23 mmol/L (ref 20–28)
Calcium: 9.5 mg/dL (ref 8.8–10.2)
Chloride: 106 mmol/L (ref 96–108)
Creatinine: 0.82 mg/dL (ref 0.51–0.95)
GFR,Black: 103 *
GFR,Caucasian: 89 *
Glucose: 95 mg/dL (ref 60–99)
Lab: 27 mg/dL — ABNORMAL HIGH (ref 6–20)
Potassium: 4 mmol/L (ref 3.3–5.1)
Sodium: 143 mmol/L (ref 133–145)

## 2014-08-06 LAB — URINALYSIS REFLEX TO CULTURE
Blood,UA: NEGATIVE
Nitrite,UA: POSITIVE — AB
Protein,UA: 25 mg/dL — AB
Specific Gravity,UA: 1.04 — ABNORMAL HIGH (ref 1.001–1.030)
pH,UA: 5 (ref 5.0–8.0)

## 2014-08-06 LAB — HOLD SST

## 2014-08-06 LAB — POCT GLUCOSE: Glucose POCT: 104 mg/dL — ABNORMAL HIGH (ref 60–99)

## 2014-08-06 LAB — HOLD LAVENDER

## 2014-08-06 LAB — HM HIV SCREENING OFFERED

## 2014-08-06 MED ORDER — HYDROCORTISONE ACE-PRAMOXINE 2.5-1 % RE CREA *I*
TOPICAL_CREAM | Freq: Once | CUTANEOUS | Status: AC
Start: 2014-08-06 — End: 2014-08-06
  Filled 2014-08-06: qty 30

## 2014-08-06 MED ORDER — IBUPROFEN 800 MG PO TABS *I*
800.0000 mg | ORAL_TABLET | Freq: Three times a day (TID) | ORAL | 0 refills | Status: AC | PRN
Start: 2014-08-06 — End: ?

## 2014-08-06 MED ORDER — CYCLOBENZAPRINE HCL 10 MG PO TABS *I*
10.0000 mg | ORAL_TABLET | Freq: Three times a day (TID) | ORAL | 0 refills | Status: AC | PRN
Start: 2014-08-06 — End: 2014-09-05

## 2014-08-06 MED ORDER — IBUPROFEN 200 MG PO TABS *I*
800.0000 mg | ORAL_TABLET | Freq: Once | ORAL | Status: AC
Start: 2014-08-06 — End: 2014-08-06
  Administered 2014-08-06: 800 mg via ORAL
  Filled 2014-08-06: qty 4

## 2014-08-06 NOTE — Discharge Instructions (Signed)
Continue on all of your current medication and care plan  Follow up with your doctor in the next few days  Return if further problem or concern      In Addition-    Ibuprofen for discomfort and inflammation  Flexeril for back spasm

## 2014-08-06 NOTE — ED Triage Notes (Signed)
Triage Note   Pt has a possible urine infection x 4 days with fever/chills. Pt also has nausea/vomiting at triage. Pt also has a possible abscess on rectum.    Kristina BravoSuzanne Daleysa Kristiansen, RN
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
## 2014-08-06 NOTE — ED Notes (Signed)
3  days of UTI s/s and Monday with noted area on her anus of soreness and pain appears red and there is no swelling or drainage noted with exam. And the toe on the left 4 th toe with open area that was draining green yellow drainage 2 days ago none today. + chills and + fever N/V/D and Hx DM

## 2014-08-06 NOTE — ED Triage Notes (Signed)
Triage Note Pt is a type 2 diabetic on metformin      Jennefer BravoSuzanne Batya Citron, RN

## 2014-08-07 LAB — AEROBIC CULTURE: Aerobic Culture: 0

## 2015-05-17 ENCOUNTER — Other Ambulatory Visit: Payer: Self-pay | Admitting: *Deleted

## 2015-05-17 DIAGNOSIS — Z1231 Encounter for screening mammogram for malignant neoplasm of breast: Secondary | ICD-10-CM

## 2015-05-22 ENCOUNTER — Ambulatory Visit
Admission: RE | Admit: 2015-05-22 | Discharge: 2015-05-22 | Disposition: A | Discharge status: Home / Self Care | Payer: No Typology Code available for payment source | Source: Ambulatory Visit | Attending: *Deleted | Admitting: *Deleted

## 2015-05-22 DIAGNOSIS — Z1231 Encounter for screening mammogram for malignant neoplasm of breast: Secondary | ICD-10-CM

## 2015-11-10 ENCOUNTER — Emergency Department (HOSPITAL_COMMUNITY)
Admission: EM | Admit: 2015-11-10 | Discharge: 2015-11-10 | Disposition: A | Discharge status: Home / Self Care | Payer: Self-pay | Attending: Emergency Medicine | Admitting: Emergency Medicine

## 2015-11-10 ENCOUNTER — Encounter (HOSPITAL_COMMUNITY): Payer: Self-pay | Admitting: *Deleted

## 2015-11-10 ENCOUNTER — Emergency Department (HOSPITAL_COMMUNITY): Payer: Self-pay

## 2015-11-10 DIAGNOSIS — I509 Heart failure, unspecified: Secondary | ICD-10-CM | POA: Insufficient documentation

## 2015-11-10 DIAGNOSIS — T7840XA Allergy, unspecified, initial encounter: Secondary | ICD-10-CM

## 2015-11-10 DIAGNOSIS — T368X5A Adverse effect of other systemic antibiotics, initial encounter: Secondary | ICD-10-CM | POA: Insufficient documentation

## 2015-11-10 DIAGNOSIS — J45909 Unspecified asthma, uncomplicated: Secondary | ICD-10-CM | POA: Insufficient documentation

## 2015-11-10 DIAGNOSIS — Z859 Personal history of malignant neoplasm, unspecified: Secondary | ICD-10-CM | POA: Insufficient documentation

## 2015-11-10 DIAGNOSIS — R21 Rash and other nonspecific skin eruption: Secondary | ICD-10-CM | POA: Insufficient documentation

## 2015-11-10 DIAGNOSIS — I110 Hypertensive heart disease with heart failure: Secondary | ICD-10-CM | POA: Insufficient documentation

## 2015-11-10 LAB — CBC WITH DIFFERENTIAL/PLATELET
BASOS PCT: 1 %
Basophils Absolute: 0 10*3/uL (ref 0.0–0.1)
EOS ABS: 0.1 10*3/uL (ref 0.0–0.7)
Eosinophils Relative: 2 %
HCT: 39.7 % (ref 36.0–46.0)
HEMOGLOBIN: 13.5 g/dL (ref 12.0–15.0)
Lymphocytes Relative: 21 %
Lymphs Abs: 1.3 10*3/uL (ref 0.7–4.0)
MCH: 30.4 pg (ref 26.0–34.0)
MCHC: 34 g/dL (ref 30.0–36.0)
MCV: 89.4 fL (ref 78.0–100.0)
Monocytes Absolute: 0.2 10*3/uL (ref 0.1–1.0)
Monocytes Relative: 4 %
Neutro Abs: 4.6 10*3/uL (ref 1.7–7.7)
Neutrophils Relative %: 72 %
Platelets: 216 10*3/uL (ref 150–400)
RBC: 4.44 MIL/uL (ref 3.87–5.11)
RDW: 13 % (ref 11.5–15.5)
WBC: 6.3 10*3/uL (ref 4.0–10.5)

## 2015-11-10 LAB — BASIC METABOLIC PANEL
Anion gap: 8 (ref 5–15)
BUN: 12 mg/dL (ref 6–20)
CALCIUM: 8.9 mg/dL (ref 8.9–10.3)
CHLORIDE: 105 mmol/L (ref 101–111)
CO2: 24 mmol/L (ref 22–32)
CREATININE: 0.79 mg/dL (ref 0.44–1.00)
GFR calc non Af Amer: >60 mL/min (ref >60)
Glucose, Bld: 139 mg/dL — ABNORMAL HIGH (ref 65–99)
Potassium: 3.9 mmol/L (ref 3.5–5.1)
SODIUM: 137 mmol/L (ref 135–145)

## 2015-11-10 LAB — I-STAT BETA HCG BLOOD, ED (MC, WL, AP ONLY)

## 2015-11-10 MED ORDER — HYDROCORTISONE 1 % EX OINT
TOPICAL_OINTMENT | Freq: Once | CUTANEOUS | Status: AC
  Administered 2015-11-10: 1 via TOPICAL
  Filled 2015-11-10: qty 28.35

## 2015-11-10 MED ORDER — RANITIDINE HCL 150 MG/10ML PO SYRP
150.0000 mg | ORAL_SOLUTION | Freq: Once | ORAL | Status: AC
  Administered 2015-11-10: 150 mg via ORAL
  Filled 2015-11-10: qty 10

## 2015-11-10 MED ORDER — PREDNISONE 20 MG PO TABS
60.0000 mg | ORAL_TABLET | Freq: Once | ORAL | Status: AC
Start: 2015-11-10 — End: 2015-11-10
  Administered 2015-11-10: 60 mg via ORAL
  Filled 2015-11-10: qty 3

## 2015-11-10 MED ORDER — ONDANSETRON 4 MG PO TBDP: 4.0000 mg | ORAL_TABLET | Freq: Once | ORAL | Status: DC

## 2015-11-10 MED ORDER — PREDNISONE 10 MG PO TABS: 40.0000 mg | ORAL_TABLET | Freq: Every day | ORAL | 0 refills | Status: AC

## 2015-11-10 MED ORDER — IPRATROPIUM-ALBUTEROL 0.5-2.5 (3) MG/3ML IN SOLN
3.0000 mL | Freq: Once | RESPIRATORY_TRACT | Status: AC
  Administered 2015-11-10: 3 mL via RESPIRATORY_TRACT
  Filled 2015-11-10: qty 3

## 2015-11-10 MED ORDER — ONDANSETRON 4 MG PO TBDP
4.0000 mg | ORAL_TABLET | Freq: Once | ORAL | Status: AC
  Administered 2015-11-10: 4 mg via ORAL
  Filled 2015-11-10: qty 1

## 2015-11-10 MED ORDER — DIPHENHYDRAMINE HCL 25 MG PO CAPS
50.0000 mg | ORAL_CAPSULE | Freq: Once | ORAL | Status: AC
  Administered 2015-11-10: 50 mg via ORAL
  Filled 2015-11-10: qty 2

## 2015-11-10 MED ORDER — ONDANSETRON 4 MG PO TBDP: 8.0000 mg | ORAL_TABLET | Freq: Once | ORAL | Status: DC

## 2015-11-10 NOTE — ED Provider Notes (Signed)
I saw and evaluated the patient, reviewed the resident's note and I agree with the findings and plan.   EKG Interpretation None      40 year old female who presents with concern for allergic reaction. Was seen at [facility] for Bartholin's gland crys 2 days ago and started on antibiotics, doxycycline and ciprofloxacin. His taking doxycycline before but not ciprofloxacin. She took her first dose of medications yesterday and began to feel whole-body pruritus and an enlarging burning rash in her left thigh. Took a second dose today and felt even more unwell with increased shortness of breath, cough, pruritus. No vomiting, but nauseous here. No chest pain, abdominal pain, oral pharyngeal swelling. On arrival, she is hemodynamically stable, on room air. With bronchospastic cough and wheezes on exam. Small urticarial type rash on the left inner thigh. Given steroids, Benadryl, Zantac and observed here in the emergency department. Also received breathing treatment. With improved symptoms. We'll discharge home for treatment for allergic reaction. She'll discontinue her antibiotics at this time. Strict return and follow-up instructions reviewed. She expressed understanding of all discharge instructions and felt comfortable with the plan of care.    Forde Dandy, MD 11/10/15 [phone]

## 2015-11-10 NOTE — ED Provider Notes (Signed)
[facility] DEPT Provider Note   CSN: [id number] Arrival date & time: 11/10/15  1507     History   Chief Complaint Chief Complaint  Patient presents with  . Allergic Reaction    HPI [name] [name] is a 40 y.o. female.   Rash   This is a new problem. The current episode started 12 to 24 hours ago. The problem has not changed since onset.The problem is associated with nothing. There has been no fever. The rash is present on the left upper leg. The pain is moderate. Associated symptoms include itching and pain. She has tried antihistamines for the symptoms. The treatment provided mild relief. Risk factors include new medications.  Cough  The current episode started 12 to 24 hours ago. The problem occurs every few minutes. The problem has not changed since onset.The cough is non-productive. Pertinent negatives include no chest pain, no chills, no ear pain, no sore throat and no shortness of breath. Her past medical history is significant for asthma.    Past Medical History:  Diagnosis Date  . Asthma   . Bipolar 1 disorder ([place])   . Cancer ([place])   . CHF (congestive heart failure) ([place])   . Depression   . Hypertension   . Obesity   . PTSD (post-traumatic stress disorder)   . Schizophrenia ([place])   . Seizures [facility])     Patient Active Problem List   Diagnosis Date Noted  . Back pain 04/21/2013  . HTN (hypertension) 04/21/2013  . Muscle spasm 04/21/2013    Past Surgical History:  Procedure Laterality Date  . CESAREAN SECTION    . CHOLECYSTECTOMY      OB History    No data available       Home Medications    Prior to Admission medications   Medication Sig Start Date End Date Taking? Authorizing Provider  albuterol (PROVENTIL HFA;VENTOLIN HFA) 108 (90 BASE) MCG/ACT inhaler Inhale 2 puffs into the lungs every 6 (six) hours as needed for wheezing or shortness of breath (SOB).    Historical Provider, MD  cephALEXin (KEFLEX) 500 MG capsule Take 1 capsule (500 mg  total) by mouth 2 (two) times daily. 08/23/13   Kristen N Ward, DO  cyclobenzaprine (FLEXERIL) 10 MG tablet Take 1 tablet (10 mg total) by mouth 2 (two) times daily as needed for muscle spasms. 10/13/13   Antonietta Breach, PA-C  diphenhydrAMINE (BENADRYL) 25 MG tablet Take 25 mg by mouth every 6 (six) hours as needed for allergies.    Historical Provider, MD  DM-Doxylamine-Acetaminophen (NYQUIL COLD & FLU PO) Take 30 mLs by mouth daily as needed (for pain).    Historical Provider, MD  HYDROcodone-acetaminophen (NORCO/VICODIN) 5-325 MG per tablet Take 1-2 tablets by mouth every 6 (six) hours as needed for moderate pain or severe pain. 02/27/14   Hannah Muthersbaugh, PA-C  ibuprofen (ADVIL,MOTRIN) 200 MG tablet Take 400 mg by mouth every 6 (six) hours as needed.    Historical Provider, MD  metroNIDAZOLE (FLAGYL) 500 MG tablet Take 1 tablet (500 mg total) by mouth 2 (two) times daily. Do not drink alcohol with this medication. 08/23/13   Kristen N Ward, DO  naproxen (NAPROSYN) 500 MG tablet Take 1 tablet (500 mg total) by mouth 2 (two) times daily. 04/26/14   Hanna Patel-Mills, PA-C  naproxen sodium (ANAPROX) 220 MG tablet Take 220 mg by mouth 2 (two) times daily as needed (for pain).    Historical Provider, MD  predniSONE (DELTASONE) 20 MG tablet Take 2  tablets (40 mg total) by mouth daily. 02/27/14   Jarrett Soho Muthersbaugh, PA-C    Family History History reviewed. No pertinent family history.  Social History Social History  Substance Use Topics  . Smoking status: Never Smoker  . Smokeless tobacco: Not on file  . Alcohol use Yes     Allergies   Aspirin; Bee venom; Sulfa antibiotics; and Ultram [tramadol]   Review of Systems Review of Systems  Constitutional: Negative for chills and fever.  HENT: Negative for ear pain and sore throat.   Eyes: Negative for pain and visual disturbance.  Respiratory: Positive for cough. Negative for shortness of breath.   Cardiovascular: Negative for chest pain and  palpitations.  Gastrointestinal: Negative for abdominal pain and vomiting.  Genitourinary: Negative for dysuria and hematuria.  Musculoskeletal: Negative for arthralgias and back pain.  Skin: Positive for itching and rash. Negative for color change.  Neurological: Negative for seizures and syncope.  All other systems reviewed and are negative.    Physical Exam Updated Vital Signs There were no vitals taken for this visit.  Physical Exam  Constitutional: She is oriented to person, place, and time. She appears well-developed and well-nourished.  HENT:  Head: Normocephalic and atraumatic.  Eyes: Conjunctivae and EOM are normal. Pupils are equal, round, and reactive to light.  Neck: Normal range of motion. Neck supple.  Cardiovascular: Normal rate and regular rhythm.   Pulmonary/Chest: Effort normal and breath sounds normal. No respiratory distress.  coughing  Abdominal: Soft. There is no tenderness.  Musculoskeletal: She exhibits no edema.  Neurological: She is alert and oriented to person, place, and time.  Skin: Skin is warm and dry. Rash (5cmx4cm macular rash to left inner thigh.) noted.  Psychiatric: She has a normal mood and affect.  Nursing note and vitals reviewed.    ED Treatments / Results  Labs (all labs ordered are listed, but only abnormal results are displayed) Labs Reviewed - No data to display  EKG  EKG Interpretation None       Radiology No results found.  Procedures Procedures (including critical care time)  Medications Ordered in ED Medications  predniSONE (DELTASONE) tablet 60 mg (not administered)  diphenhydrAMINE (BENADRYL) capsule 50 mg (not administered)  ipratropium-albuterol (DUONEB) 0.5-2.5 (3) MG/3ML nebulizer solution 3 mL (not administered)     Initial Impression / Assessment and Plan / ED Course  I have reviewed the triage vital signs and the nursing notes.  Pertinent labs & imaging results that were available during my care of  the patient were reviewed by me and considered in my medical decision making (see chart for details).  Clinical Course    [name] [name] is a 40 year old female with past medical history significant for CHF, depression, schizophrenia, and asthma who presents for rash and coughing.  She was recently diagnosed with Bartholin's cyst and started on antibiotics including Keflex and doxycycline.  She is unsure if she is taking these in the past.  Shortly after starting the medications, she developed a rash to her left inner thigh which consists of a erythematous and burning macule.  She states that she has had allergic reactions in the past that started with similar rash in similar location.  In addition she has known cough variant asthma.  She has become mildly distressed with coughing.  She attempted home inhaler with no resolution.  Patient was provided with a DuoNeb treatment which resolved her coughing.  Her symptoms do not include stridor, wheezing, or GI symptoms.  She is  well-appearing with stable vitals.  Chest x-ray obtained, personally reviewed by me, demonstrates no acute cardiac or pulmonary processes.  Labs ordered including beta-hCG, CBC, BMP.  Results are unremarkable.  Patient was given hydrocortisone cream, Zofran, Zantac, prednisone, Benadryl.  Symptoms have improved.  Patient is discharged home with strict return precautions, follow up instructions, and educational materials.  She is also provided with prescription for continued prednisone.  She is instructed to stop the antibiotic medications and follow with PCP for reevaluation.   Final Clinical Impressions(s) / ED Diagnoses   Final diagnoses:  None    New Prescriptions New Prescriptions   No medications on file     Elveria Rising, MD 11/11/15 Blackford Liu, MD 11/12/15 [phone]

## 2015-11-10 NOTE — ED Triage Notes (Signed)
Pt c/o possible allergic reaction to antibiotic. Pt states she took the antibiotic last night, felt like she was having trouble breathing, took benadryl to help her go to sleep. Pt took another dose of antibiotic this morning around 1100. Pt speaking in full complete sentences, pulse ox 100%. Pt states she feels like her throat is tightening up. Pt has hx of asthma and panic attacks.

## 2015-11-10 NOTE — Discharge Instructions (Signed)
Take 25mg  of benadryl every 6 hours as need for itching for the next 48 hours.  Please stop taking your new antibiotics (doxycycline and cephalexin).  If your abscess worsens (increased area of redness or swelling, new fevers, chills, vomiting, etc.), please return for reevaluation to your PCP or the ED.

## 2016-01-26 ENCOUNTER — Emergency Department (HOSPITAL_COMMUNITY): Payer: No Typology Code available for payment source

## 2016-01-26 ENCOUNTER — Emergency Department (HOSPITAL_COMMUNITY)
Admission: EM | Admit: 2016-01-26 | Discharge: 2016-01-26 | Disposition: A | Discharge status: Home / Self Care | Payer: No Typology Code available for payment source | Attending: Emergency Medicine | Admitting: Emergency Medicine

## 2016-01-26 ENCOUNTER — Encounter (HOSPITAL_COMMUNITY): Payer: Self-pay

## 2016-01-26 DIAGNOSIS — M791 Myalgia: Secondary | ICD-10-CM | POA: Insufficient documentation

## 2016-01-26 DIAGNOSIS — R931 Abnormal findings on diagnostic imaging of heart and coronary circulation: Secondary | ICD-10-CM | POA: Diagnosis not present

## 2016-01-26 DIAGNOSIS — Y939 Activity, unspecified: Secondary | ICD-10-CM | POA: Insufficient documentation

## 2016-01-26 DIAGNOSIS — Y92410 Unspecified street and highway as the place of occurrence of the external cause: Secondary | ICD-10-CM | POA: Diagnosis not present

## 2016-01-26 DIAGNOSIS — I110 Hypertensive heart disease with heart failure: Secondary | ICD-10-CM | POA: Insufficient documentation

## 2016-01-26 DIAGNOSIS — J45909 Unspecified asthma, uncomplicated: Secondary | ICD-10-CM | POA: Diagnosis not present

## 2016-01-26 DIAGNOSIS — R200 Anesthesia of skin: Secondary | ICD-10-CM | POA: Insufficient documentation

## 2016-01-26 DIAGNOSIS — S39012A Strain of muscle, fascia and tendon of lower back, initial encounter: Secondary | ICD-10-CM | POA: Insufficient documentation

## 2016-01-26 DIAGNOSIS — M7918 Myalgia, other site: Secondary | ICD-10-CM

## 2016-01-26 DIAGNOSIS — I509 Heart failure, unspecified: Secondary | ICD-10-CM | POA: Diagnosis not present

## 2016-01-26 DIAGNOSIS — S161XXA Strain of muscle, fascia and tendon at neck level, initial encounter: Secondary | ICD-10-CM | POA: Diagnosis not present

## 2016-01-26 DIAGNOSIS — S3992XA Unspecified injury of lower back, initial encounter: Secondary | ICD-10-CM | POA: Diagnosis present

## 2016-01-26 DIAGNOSIS — Y999 Unspecified external cause status: Secondary | ICD-10-CM | POA: Diagnosis not present

## 2016-01-26 DIAGNOSIS — R202 Paresthesia of skin: Secondary | ICD-10-CM

## 2016-01-26 LAB — COMPREHENSIVE METABOLIC PANEL
ALK PHOS: 47 U/L (ref 38–126)
ALT: 23 U/L (ref 14–54)
AST: 25 U/L (ref 15–41)
Albumin: 3.5 g/dL (ref 3.5–5.0)
Anion gap: 6 (ref 5–15)
BUN: 17 mg/dL (ref 6–20)
CALCIUM: 9.5 mg/dL (ref 8.9–10.3)
CO2: 24 mmol/L (ref 22–32)
CREATININE: 0.64 mg/dL (ref 0.44–1.00)
Chloride: 109 mmol/L (ref 101–111)
Glucose, Bld: 103 mg/dL — ABNORMAL HIGH (ref 65–99)
Potassium: 4.4 mmol/L (ref 3.5–5.1)
Sodium: 139 mmol/L (ref 135–145)
Total Bilirubin: 0.9 mg/dL (ref 0.3–1.2)
Total Protein: 7.2 g/dL (ref 6.5–8.1)

## 2016-01-26 LAB — URINALYSIS, ROUTINE W REFLEX MICROSCOPIC
Bilirubin Urine: NEGATIVE
Glucose, UA: NEGATIVE mg/dL
Hgb urine dipstick: NEGATIVE
Ketones, ur: NEGATIVE mg/dL
LEUKOCYTES UA: NEGATIVE
Nitrite: NEGATIVE
PROTEIN: NEGATIVE mg/dL
Specific Gravity, Urine: 1.017 (ref 1.005–1.030)
pH: 6 (ref 5.0–8.0)

## 2016-01-26 LAB — I-STAT CHEM 8, ED
BUN: 19 mg/dL (ref 6–20)
CHLORIDE: 108 mmol/L (ref 101–111)
CREATININE: 0.6 mg/dL (ref 0.44–1.00)
Calcium, Ion: 1.19 mmol/L (ref 1.15–1.40)
GLUCOSE: 100 mg/dL — AB (ref 65–99)
HCT: 40 % (ref 36.0–46.0)
HEMOGLOBIN: 13.6 g/dL (ref 12.0–15.0)
POTASSIUM: 4.4 mmol/L (ref 3.5–5.1)
Sodium: 142 mmol/L (ref 135–145)
TCO2: 26 mmol/L (ref 0–100)

## 2016-01-26 LAB — CBC
HCT: 39.6 % (ref 36.0–46.0)
Hemoglobin: 13.5 g/dL (ref 12.0–15.0)
MCH: 30.6 pg (ref 26.0–34.0)
MCHC: 34.1 g/dL (ref 30.0–36.0)
MCV: 89.8 fL (ref 78.0–100.0)
PLATELETS: 205 10*3/uL (ref 150–400)
RBC: 4.41 MIL/uL (ref 3.87–5.11)
RDW: 13.5 % (ref 11.5–15.5)
WBC: 7.1 10*3/uL (ref 4.0–10.5)

## 2016-01-26 LAB — PROTIME-INR
INR: 0.94
Prothrombin Time: 12.6 seconds (ref 11.4–15.2)

## 2016-01-26 LAB — CDS SEROLOGY

## 2016-01-26 LAB — I-STAT CG4 LACTIC ACID, ED: LACTIC ACID, VENOUS: 1.78 mmol/L (ref 0.5–1.9)

## 2016-01-26 MED ORDER — FENTANYL CITRATE (PF) 100 MCG/2ML IJ SOLN
INTRAMUSCULAR | Status: AC
  Filled 2016-01-26: qty 2

## 2016-01-26 MED ORDER — OXYCODONE-ACETAMINOPHEN 5-325 MG PO TABS: 2.0000 | ORAL_TABLET | ORAL | 0 refills | Status: DC | PRN

## 2016-01-26 MED ORDER — IOPAMIDOL (ISOVUE-300) INJECTION 61%
INTRAVENOUS | Status: AC
  Administered 2016-01-26: 100 mL via INTRAVENOUS
  Filled 2016-01-26: qty 100

## 2016-01-26 MED ORDER — HYDROMORPHONE HCL 2 MG/ML IJ SOLN
1.0000 mg | Freq: Once | INTRAMUSCULAR | Status: AC
  Administered 2016-01-26: 1 mg via INTRAVENOUS
  Filled 2016-01-26: qty 1

## 2016-01-26 MED ORDER — DOCUSATE SODIUM 250 MG PO CAPS: 250.0000 mg | ORAL_CAPSULE | Freq: Every day | ORAL | 0 refills | Status: DC

## 2016-01-26 MED ORDER — ONDANSETRON HCL 4 MG/2ML IJ SOLN
4.0000 mg | Freq: Once | INTRAMUSCULAR | Status: AC
  Administered 2016-01-26: 4 mg via INTRAVENOUS
  Filled 2016-01-26: qty 2

## 2016-01-26 MED ORDER — FENTANYL CITRATE (PF) 100 MCG/2ML IJ SOLN
INTRAMUSCULAR | Status: AC | PRN
  Administered 2016-01-26: 50 ug via INTRAVENOUS
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
## 2016-01-26 NOTE — ED Triage Notes (Signed)
Pt arrives with pain to the back area and tailbone and the middle of the back and neck

## 2016-01-26 NOTE — Discharge Instructions (Signed)

## 2016-01-26 NOTE — ED Notes (Signed)
Patient transported to CT 

## 2016-01-26 NOTE — ED Notes (Signed)
Pt arrives on spinal board and c-collar in place pt c/o paint to mid to lower back and neck area pt is awake and alert able to answer questions appropriately

## 2016-01-26 NOTE — ED Notes (Signed)
ED Provider at bedside. 
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
## 2016-05-07 ENCOUNTER — Ambulatory Visit (INDEPENDENT_AMBULATORY_CARE_PROVIDER_SITE_OTHER): Payer: Self-pay

## 2016-05-07 ENCOUNTER — Encounter (HOSPITAL_COMMUNITY): Payer: Self-pay | Admitting: *Deleted

## 2016-05-07 ENCOUNTER — Ambulatory Visit (HOSPITAL_COMMUNITY)
Admission: EM | Admit: 2016-05-07 | Discharge: 2016-05-07 | Disposition: A | Discharge status: Home / Self Care | Payer: Self-pay | Attending: Internal Medicine | Admitting: Internal Medicine

## 2016-05-07 DIAGNOSIS — N939 Abnormal uterine and vaginal bleeding, unspecified: Secondary | ICD-10-CM

## 2016-05-07 DIAGNOSIS — R1030 Lower abdominal pain, unspecified: Secondary | ICD-10-CM

## 2016-05-07 DIAGNOSIS — M545 Low back pain: Secondary | ICD-10-CM

## 2016-05-07 LAB — POCT URINALYSIS DIP (DEVICE)
Bilirubin Urine: NEGATIVE
Glucose, UA: NEGATIVE mg/dL
Hgb urine dipstick: NEGATIVE
KETONES UR: NEGATIVE mg/dL
Leukocytes, UA: NEGATIVE
Nitrite: NEGATIVE
PROTEIN: NEGATIVE mg/dL
Specific Gravity, Urine: <=1.005 (ref 1.005–1.030)
UROBILINOGEN UA: 0.2 mg/dL (ref 0.0–1.0)
pH: 5.5 (ref 5.0–8.0)

## 2016-05-07 MED ORDER — KETOROLAC TROMETHAMINE 60 MG/2ML IM SOLN
INTRAMUSCULAR | Status: AC
  Filled 2016-05-07: qty 2

## 2016-05-07 MED ORDER — KETOROLAC TROMETHAMINE 60 MG/2ML IM SOLN
60.0000 mg | Freq: Once | INTRAMUSCULAR | Status: AC
  Administered 2016-05-07: 60 mg via INTRAMUSCULAR
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
## 2016-05-07 NOTE — ED Provider Notes (Signed)
CSN: [id number]     Arrival date & time 05/07/16  1330 History   First MD Initiated Contact with Patient 05/07/16 1520     Chief Complaint  Patient presents with  . Abdominal Pain   (Consider location/radiation/quality/duration/timing/severity/associated sxs/prior Treatment) The history is provided by the patient.  Abdominal Pain  Pain location:  LLQ, RLQ and R flank Pain quality: shooting and stabbing   Pain radiates to:  Does not radiate Pain severity:  Moderate Onset quality:  Sudden Duration:  3 days Timing:  Constant Progression:  Worsening Relieved by:  Nothing Associated symptoms: vaginal bleeding   Associated symptoms: no constipation, no diarrhea, no nausea, no vaginal discharge and no vomiting     Past Medical History:  Diagnosis Date  . Asthma   . Bipolar 1 disorder ([place])   . Cancer ([place])   . CHF (congestive heart failure) ([place])   . Depression   . Hypertension   . Obesity   . PTSD (post-traumatic stress disorder)   . Schizophrenia ([place])   . Seizures ([place])    Past Surgical History:  Procedure Laterality Date  . CESAREAN SECTION    . CHOLECYSTECTOMY     History reviewed. No pertinent family history. Social History  Substance Use Topics  . Smoking status: Never Smoker  . Smokeless tobacco: Never Used  . Alcohol use Yes   OB History    No data available     Review of Systems  Constitutional: Negative.   HENT: Negative.   Respiratory: Negative.   Cardiovascular: Negative.   Gastrointestinal: Positive for abdominal pain (Lower abdominal pain, radiating to Rt groin ). Negative for constipation, diarrhea, nausea and vomiting.  Genitourinary: Positive for vaginal bleeding. Negative for vaginal discharge and vaginal pain.  Musculoskeletal: Positive for back pain (Lower back pain and right Flank).  Neurological: Negative.   Hematological: Negative.     Allergies  Aspirin; Bee venom; Sulfa antibiotics; and Ultram [tramadol]  Home Medications   Prior  to Admission medications   Medication Sig Start Date End Date Taking? Authorizing Provider  albuterol (PROVENTIL HFA;VENTOLIN HFA) 108 (90 BASE) MCG/ACT inhaler Inhale 2 puffs into the lungs every 6 (six) hours as needed for wheezing or shortness of breath (SOB).    Historical Provider, MD  cephALEXin (KEFLEX) 500 MG capsule Take 1 capsule (500 mg total) by mouth 2 (two) times daily. 08/23/13   Kristen N Ward, DO  cyclobenzaprine (FLEXERIL) 10 MG tablet Take 1 tablet (10 mg total) by mouth 2 (two) times daily as needed for muscle spasms. 10/13/13   Antonietta Breach, PA-C  diphenhydrAMINE (BENADRYL) 25 MG tablet Take 25 mg by mouth every 6 (six) hours as needed for allergies.    Historical Provider, MD  DM-Doxylamine-Acetaminophen (NYQUIL COLD & FLU PO) Take 30 mLs by mouth daily as needed (for pain).    Historical Provider, MD  docusate sodium (COLACE) 250 MG capsule Take 1 capsule (250 mg total) by mouth daily. 01/26/16   Margette Fast, MD  HYDROcodone-acetaminophen (NORCO/VICODIN) 5-325 MG per tablet Take 1-2 tablets by mouth every 6 (six) hours as needed for moderate pain or severe pain. 02/27/14   Hannah Muthersbaugh, PA-C  ibuprofen (ADVIL,MOTRIN) 200 MG tablet Take 400 mg by mouth every 6 (six) hours as needed.    Historical Provider, MD  metroNIDAZOLE (FLAGYL) 500 MG tablet Take 1 tablet (500 mg total) by mouth 2 (two) times daily. Do not drink alcohol with this medication. 08/23/13   [place], DO  naproxen (NAPROSYN) 500 MG tablet Take 1 tablet (500 mg total) by mouth 2 (two) times daily. 04/26/14   Hanna Patel-Mills, PA-C  naproxen sodium (ANAPROX) 220 MG tablet Take 220 mg by mouth 2 (two) times daily as needed (for pain).    Historical Provider, MD  oxyCODONE-acetaminophen (PERCOCET/ROXICET) 5-325 MG tablet Take 2 tablets by mouth every 4 (four) hours as needed for severe pain. 01/26/16   Margette Fast, MD   Meds Ordered and Administered this Visit  Medications - No data to display  BP (!)  125/94 (BP Location: Right Arm) Comment: nnotified rn  Pulse 84   Temp 98.5 F (36.9 C) (Oral)   Resp 16   SpO2 97%  No data found.   Physical Exam  Constitutional: She is oriented to person, place, and time. She appears well-developed and well-nourished.  HENT:  Head: Normocephalic.  Eyes: Pupils are equal, round, and reactive to light.  Cardiovascular: Normal rate, regular rhythm and normal heart sounds.   Pulmonary/Chest: Effort normal and breath sounds normal.  Abdominal: Soft. Bowel sounds are normal. She exhibits no distension and no mass. There is tenderness (extremely tender to palpation to both RLQ and LLQ). There is guarding. There is no rebound.  Musculoskeletal: Normal range of motion. She exhibits tenderness (Lower back pain , tedner to palpation at LS vertebrae and right paraspinous processes. ). She exhibits no edema.  Neurological: She is alert and oriented to person, place, and time.  Skin: Skin is warm.    Urgent Care Course     Procedures (including critical care time)  Labs Review Labs Reviewed - No data to display  Imaging Review No results found.   Visual Acuity Review  Right Eye Distance:   Left Eye Distance:   Bilateral Distance:    Right Eye Near:   Left Eye Near:    Bilateral Near:      Aspirin allergies discussed. Pt reports have taken toradol before without adverse effects. Pt currently taking Aleve for pain.   MDM   1. Lower abdominal pain   2. Vaginal spotting   Abdominal XR and Urine dipstick negative. Will schedule pt with Out patient Ob/gyn clinic for further evaluation of Vaginal spotting. If Sx worsens advised to return to clinic or go to ED.    Laurice Iglesia, NP 05/07/16 [place], NP 05/07/16 1655

## 2022-05-29 ENCOUNTER — Emergency Department (HOSPITAL_COMMUNITY): Payer: Medicaid Other

## 2022-05-29 ENCOUNTER — Emergency Department (HOSPITAL_COMMUNITY)
Admission: EM | Admit: 2022-05-29 | Discharge: 2022-05-30 | Disposition: A | Discharge status: Home / Self Care | Payer: Medicaid Other | Attending: Emergency Medicine | Admitting: Emergency Medicine

## 2022-05-29 ENCOUNTER — Other Ambulatory Visit: Payer: Self-pay

## 2022-05-29 ENCOUNTER — Encounter (HOSPITAL_COMMUNITY): Payer: Self-pay | Admitting: Emergency Medicine

## 2022-05-29 DIAGNOSIS — W010XXA Fall on same level from slipping, tripping and stumbling without subsequent striking against object, initial encounter: Secondary | ICD-10-CM | POA: Diagnosis not present

## 2022-05-29 DIAGNOSIS — M546 Pain in thoracic spine: Secondary | ICD-10-CM | POA: Insufficient documentation

## 2022-05-29 DIAGNOSIS — N3090 Cystitis, unspecified without hematuria: Secondary | ICD-10-CM

## 2022-05-29 MED ORDER — KETAMINE HCL 50 MG/5ML IJ SOSY
15.0000 mg | PREFILLED_SYRINGE | Freq: Once | INTRAMUSCULAR | Status: AC
  Administered 2022-05-29: 15 mg via INTRAVENOUS
  Filled 2022-05-29: qty 5

## 2022-05-29 MED ORDER — OXYCODONE HCL 5 MG PO TABS
5.0000 mg | ORAL_TABLET | Freq: Once | ORAL | Status: AC
  Administered 2022-05-29: 5 mg via ORAL
  Filled 2022-05-29: qty 1

## 2022-05-29 MED ORDER — KETOROLAC TROMETHAMINE 15 MG/ML IJ SOLN
15.0000 mg | Freq: Once | INTRAMUSCULAR | Status: AC
  Administered 2022-05-29: 15 mg via INTRAVENOUS
  Filled 2022-05-29: qty 1

## 2022-05-29 MED ORDER — DIAZEPAM 2 MG PO TABS
2.0000 mg | ORAL_TABLET | Freq: Once | ORAL | Status: AC
  Administered 2022-05-29: 2 mg via ORAL
  Filled 2022-05-29: qty 1

## 2022-05-29 MED ORDER — ACETAMINOPHEN 500 MG PO TABS
1000.0000 mg | ORAL_TABLET | Freq: Once | ORAL | Status: AC
  Administered 2022-05-29: 1000 mg via ORAL
  Filled 2022-05-29: qty 2

## 2022-05-29 NOTE — ED Provider Notes (Signed)
[place] EMERGENCY DEPARTMENT AT [facility] Provider Note   CSN: [id number] Arrival date & time: 05/29/22  2046     History  Chief Complaint  Patient presents with   Fall    [name] [name] is a 47 y.o. female.  46 yo F with a chief complaints of back pain.  This is mid thoracic radiates to both sides.  Worse with movement palpation.  She has had some spasms to the area.  She tells me that she was trying to hurry putting on a bag and she lost her balance and fell down a few stairs.  Fell flat on her back.  She had no pain initially and was able to make it to the bus which is why she was in a hurry.  She said after she read the bus for a while she realized that maybe her back was a bit sore.  She tried to get off the bus and felt like she had shooting pain to both sides.  She has a history of chronic back pain and sees pain management for it but thinks that this feels mildly different.  She also coughed once and felt she coughed up blood.  She is worried that maybe she broke a rib.   Fall       Home Medications Prior to Admission medications   Medication Sig Start Date End Date Taking? Authorizing Provider  albuterol (PROVENTIL HFA;VENTOLIN HFA) 108 (90 BASE) MCG/ACT inhaler Inhale 2 puffs into the lungs every 6 (six) hours as needed for wheezing or shortness of breath (SOB).    [provider]  cephALEXin (KEFLEX) 500 MG capsule Take 1 capsule (500 mg total) by mouth 2 (two) times daily. 08/23/13   Ward, Layla Maw, DO  cyclobenzaprine (FLEXERIL) 10 MG tablet Take 1 tablet (10 mg total) by mouth 2 (two) times daily as needed for muscle spasms. 10/13/13   Antony Madura, PA-C  diphenhydrAMINE (BENADRYL) 25 MG tablet Take 25 mg by mouth every 6 (six) hours as needed for allergies.    [provider]  DM-Doxylamine-Acetaminophen (NYQUIL COLD & FLU PO) Take 30 mLs by mouth daily as needed (for pain).    [provider]  docusate sodium (COLACE) 250  MG capsule Take 1 capsule (250 mg total) by mouth daily. 01/26/16   Long, Arlyss Repress, MD  HYDROcodone-acetaminophen (NORCO/VICODIN) 5-325 MG per tablet Take 1-2 tablets by mouth every 6 (six) hours as needed for moderate pain or severe pain. 02/27/14   Muthersbaugh, Dahlia Client, PA-C  ibuprofen (ADVIL,MOTRIN) 200 MG tablet Take 400 mg by mouth every 6 (six) hours as needed.    [provider]  metroNIDAZOLE (FLAGYL) 500 MG tablet Take 1 tablet (500 mg total) by mouth 2 (two) times daily. Do not drink alcohol with this medication. 08/23/13   Ward, Layla Maw, DO  naproxen (NAPROSYN) 500 MG tablet Take 1 tablet (500 mg total) by mouth 2 (two) times daily. 04/26/14   Patel-Mills, Lorelle [place], PA-C  naproxen sodium (ANAPROX) 220 MG tablet Take 220 mg by mouth 2 (two) times daily as needed (for pain).    [provider]  oxyCODONE-acetaminophen (PERCOCET/ROXICET) 5-325 MG tablet Take 2 tablets by mouth every 4 (four) hours as needed for severe pain. 01/26/16   Long, Arlyss Repress, MD      Allergies    Aspirin, Bee venom, Sulfa antibiotics, and Ultram [tramadol]    Review of Systems   Review of Systems  Physical Exam Updated Vital Signs BP  116/63   Pulse 72   Temp 98 F (36.7 C)   Resp 18   Ht 5' 2.5" (1.588 m)   Wt 130.2 kg   SpO2 100%   BMI 51.66 kg/m  Physical Exam Vitals and nursing note reviewed.  Constitutional:      General: She is not in acute distress.    Appearance: She is well-developed. She is not diaphoretic.     Comments: BMI 52  HENT:     Head: Normocephalic and atraumatic.  Eyes:     Pupils: Pupils are equal, round, and reactive to light.  Cardiovascular:     Rate and Rhythm: Normal rate and regular rhythm.     Heart sounds: No murmur heard.    No friction rub. No gallop.  Pulmonary:     Effort: Pulmonary effort is normal.     Breath sounds: No wheezing or rales.  Abdominal:     General: There is no distension.     Palpations: Abdomen is soft.     Tenderness:  There is no abdominal tenderness.  Musculoskeletal:        General: No tenderness.     Cervical back: Normal range of motion and neck supple.     Comments: PMS intact to BLE, she has some mild midthoracic back discomfort.  No obvious signs of trauma externally.  Skin:    General: Skin is warm and dry.  Neurological:     Mental Status: She is alert and oriented to person, place, and time.  Psychiatric:        Behavior: Behavior normal.     ED Results / Procedures / Treatments   Labs (all labs ordered are listed, but only abnormal results are displayed) Labs Reviewed  URINALYSIS, ROUTINE W REFLEX MICROSCOPIC    EKG None  Radiology DG Chest 1 View  Result Date: 05/29/2022 CLINICAL DATA:  Chest pain EXAM: CHEST  1 VIEW COMPARISON:  Chest x-ray 01/26/2016 FINDINGS: The heart size and mediastinal contours are within normal limits. Both lungs are clear. The visualized skeletal structures are unremarkable. IMPRESSION: No active disease. Electronically Signed   By: Darliss Cheney M.D.   On: 05/29/2022 22:55   DG Thoracic Spine 2 View  Result Date: 05/29/2022 CLINICAL DATA:  Chest pain EXAM: THORACIC SPINE 2 VIEWS COMPARISON:  None Available. FINDINGS: There is no evidence of thoracic spine fracture. Alignment is normal. Mild degenerative endplate changes are seen throughout the thoracic spine. IMPRESSION: Mild degenerative changes in the thoracic spine. Electronically Signed   By: Darliss Cheney M.D.   On: 05/29/2022 22:55    Procedures Procedures    Medications Ordered in ED Medications  ketamine 50 mg in normal saline 5 mL (10 mg/mL) syringe (has no administration in time range)  acetaminophen (TYLENOL) tablet 1,000 mg (1,000 mg Oral Given 05/29/22 2154)  ketorolac (TORADOL) 15 MG/ML injection 15 mg (15 mg Intravenous Given 05/29/22 2153)  oxyCODONE (Oxy IR/ROXICODONE) immediate release tablet 5 mg (5 mg Oral Given 05/29/22 2154)  diazepam (VALIUM) tablet 2 mg (2 mg Oral Given 05/29/22 2154)     ED Course/ Medical Decision Making/ A&P                             Medical Decision Making Amount and/or Complexity of Data Reviewed Labs: ordered. Radiology: ordered.  Risk OTC drugs. Prescription drug management.   47 yo F with a significant past medical history of chronic back pain  and sees pain management comes in for a chief complaints of back pain after a fall.  Patient actually fell and then hours later developed back pain.  She also endorses some new urinary symptoms, tells me that she is been urinating less than she would expect based on the amount of fluid that she drinks.  Sometimes feels like she has to go and then is unable.  This been going on for about 3 days prior to her fall.  I feel her history is very inconsistent with cauda equina syndrome.  Will obtain a UA if she is able.  Treat pain here.  Plain film of the chest and T-spine.  Reassess.  Plain film independently interpreted by me without focal infiltrate or ptx.  Plain film of the T-spine independently interpreted by me without obvious fracture.  Awaiting UA.    Patient care signed out to Dr. Madilyn Hook, please see their note for further details of care in the ED.  The patients results and plan were reviewed and discussed.   Any x-rays performed were independently reviewed by myself.   Differential diagnosis were considered with the presenting HPI.  Medications  ketamine 50 mg in normal saline 5 mL (10 mg/mL) syringe (has no administration in time range)  acetaminophen (TYLENOL) tablet 1,000 mg (1,000 mg Oral Given 05/29/22 2154)  ketorolac (TORADOL) 15 MG/ML injection 15 mg (15 mg Intravenous Given 05/29/22 2153)  oxyCODONE (Oxy IR/ROXICODONE) immediate release tablet 5 mg (5 mg Oral Given 05/29/22 2154)  diazepam (VALIUM) tablet 2 mg (2 mg Oral Given 05/29/22 2154)    Vitals:   05/29/22 2049 05/29/22 2052  BP:  116/63  Pulse:  72  Resp:  18  Temp:  98 F (36.7 C)  SpO2:  100%  Weight: 130.2 kg   Height:  5' 2.5" (1.588 m)     Final diagnoses:  Acute bilateral thoracic back pain    Admission/ observation were discussed with the admitting physician, patient and/or family and they are comfortable with the plan.            Final Clinical Impression(s) / ED Diagnoses Final diagnoses:  Acute bilateral thoracic back pain    Rx / DC Orders ED Discharge Orders     None         Melene Plan, DO 05/29/22 2309

## 2022-05-29 NOTE — ED Notes (Signed)
Knife in patients bag given to security.

## 2022-05-29 NOTE — ED Notes (Signed)
Spoke to [place] about fall on thinners and to active a code or not

## 2022-05-29 NOTE — ED Triage Notes (Signed)
Pt bib gcems for fall down 4 concrete steps earlier today. Pt ambulatory after fall but states she started to feel 10/10 back pain after sneezing. Pt states she has broken her back x3. Endorses nausea and states there may have been blood in vomit. Unknown head trauma. No blood thinners. Denies loc. Pt also states she has been unable to urinate today. 4mg  zofran IM given PTA.

## 2022-05-29 NOTE — ED Notes (Signed)
Tried 3 times to insert IV, pt then asked for Ultrasound which she normally needs for an IV

## 2022-05-29 NOTE — ED Notes (Signed)
Pt NOT on thinners

## 2022-05-30 LAB — URINALYSIS, ROUTINE W REFLEX MICROSCOPIC
Bilirubin Urine: NEGATIVE
Glucose, UA: NEGATIVE mg/dL
Hgb urine dipstick: NEGATIVE
Ketones, ur: NEGATIVE mg/dL
Nitrite: NEGATIVE
Protein, ur: NEGATIVE mg/dL
Specific Gravity, Urine: 1.015 (ref 1.005–1.030)
pH: 5 (ref 5.0–8.0)

## 2022-05-30 MED ORDER — PROMETHAZINE HCL 25 MG PO TABS: 25.0000 mg | ORAL_TABLET | Freq: Three times a day (TID) | ORAL | 0 refills | Status: DC | PRN

## 2022-05-30 MED ORDER — FOSFOMYCIN TROMETHAMINE 3 G PO PACK: 3.0000 g | PACK | Freq: Once | ORAL | 0 refills | Status: AC

## 2022-05-30 MED ORDER — ONDANSETRON 4 MG PO TBDP
4.0000 mg | ORAL_TABLET | Freq: Once | ORAL | Status: AC
  Administered 2022-05-30: 4 mg via ORAL
  Filled 2022-05-30: qty 1

## 2022-05-30 MED ORDER — FOSFOMYCIN TROMETHAMINE 3 G PO PACK
3.0000 g | PACK | Freq: Once | ORAL | Status: AC
  Administered 2022-05-30: 3 g via ORAL
  Filled 2022-05-30: qty 3

## 2022-05-31 LAB — URINE CULTURE

## 2022-06-01 LAB — URINE CULTURE: Culture: >=100000 — AB

## 2022-06-02 ENCOUNTER — Telehealth (HOSPITAL_BASED_OUTPATIENT_CLINIC_OR_DEPARTMENT_OTHER): Payer: Self-pay | Admitting: *Deleted

## 2022-06-02 NOTE — Telephone Encounter (Signed)
Post ED Visit - Positive Culture Follow-up  Culture report reviewed by antimicrobial stewardship pharmacist: [facility] []  Enzo Bi, Pharm.D. []  Celedonio Miyamoto, [address].D., BCPS AQ-ID []  Garvin Fila, Pharm.D., BCPS []  Georgina Pillion, Pharm.D., BCPS []  [place], [address].D., BCPS, AAHIVP []  Estella Husk, Pharm.D., BCPS, AAHIVP []  Lysle Pearl, PharmD, BCPS []  Phillips Climes, PharmD, BCPS []  Agapito Games, PharmD, BCPS []  Verlan Friends, PharmD []  Mervyn Gay, PharmD, BCPS [x]  Domingo Sep, PharmD  [facility] []  Len Childs, PharmD []  Greer Pickerel, PharmD []  Adalberto Cole, PharmD []  Perlie Gold, Rph []  Lonell Face) Jean Rosenthal, PharmD []  Earl Many, PharmD []  Junita Push, PharmD []  Dorna Leitz, PharmD []  Terrilee Files, PharmD []  Lynann Beaver, PharmD []  Keturah Barre, PharmD []  Loralee Pacas, PharmD []  Bernadene Person, PharmD   Positive urine culture Treated with Fosfomycin, organism sensitive to the same and no further patient follow-up is required at this time.  Patsey Berthold 06/02/2022, 9:54 AM

## 2022-06-19 ENCOUNTER — Other Ambulatory Visit: Payer: Self-pay

## 2022-06-19 ENCOUNTER — Emergency Department (HOSPITAL_COMMUNITY)
Admission: EM | Admit: 2022-06-19 | Discharge: 2022-06-20 | Disposition: A | Discharge status: Home / Self Care | Payer: Medicaid Other | Attending: Emergency Medicine | Admitting: Emergency Medicine

## 2022-06-19 DIAGNOSIS — N390 Urinary tract infection, site not specified: Secondary | ICD-10-CM | POA: Diagnosis not present

## 2022-06-19 DIAGNOSIS — R110 Nausea: Secondary | ICD-10-CM | POA: Insufficient documentation

## 2022-06-19 DIAGNOSIS — R319 Hematuria, unspecified: Secondary | ICD-10-CM | POA: Diagnosis present

## 2022-06-19 DIAGNOSIS — R55 Syncope and collapse: Secondary | ICD-10-CM | POA: Diagnosis not present

## 2022-06-19 LAB — CBC
HCT: 39.9 % (ref 36.0–46.0)
Hemoglobin: 13.3 g/dL (ref 12.0–15.0)
MCH: 30.3 pg (ref 26.0–34.0)
MCHC: 33.3 g/dL (ref 30.0–36.0)
MCV: 90.9 fL (ref 80.0–100.0)
Platelets: 288 10*3/uL (ref 150–400)
RBC: 4.39 MIL/uL (ref 3.87–5.11)
RDW: 13.2 % (ref 11.5–15.5)
WBC: 8.8 10*3/uL (ref 4.0–10.5)
nRBC: 0 % (ref 0.0–0.2)

## 2022-06-19 LAB — CBG MONITORING, ED: Glucose-Capillary: 96 mg/dL (ref 70–99)

## 2022-06-19 NOTE — ED Triage Notes (Signed)
Pt arrives to ED c/o of dysuria x 2 days. Pt reports that she noticed blood in urine today. Pt states that she administered home UTI test kit with positive results. Pt reports nausea and cramping.

## 2022-06-19 NOTE — ED Notes (Signed)
Pt unable to void at this time, pt given a specimen cup.

## 2022-06-20 ENCOUNTER — Emergency Department (HOSPITAL_COMMUNITY)
Admission: EM | Admit: 2022-06-20 | Discharge: 2022-06-20 | Disposition: A | Discharge status: Home / Self Care | Payer: Medicaid Other | Source: Home / Self Care | Attending: Emergency Medicine | Admitting: Emergency Medicine

## 2022-06-20 ENCOUNTER — Emergency Department (HOSPITAL_COMMUNITY): Payer: Medicaid Other

## 2022-06-20 ENCOUNTER — Encounter (HOSPITAL_COMMUNITY): Payer: Self-pay

## 2022-06-20 DIAGNOSIS — R55 Syncope and collapse: Secondary | ICD-10-CM | POA: Insufficient documentation

## 2022-06-20 LAB — CBC WITH DIFFERENTIAL/PLATELET
Abs Immature Granulocytes: 0.02 10*3/uL (ref 0.00–0.07)
Basophils Absolute: 0.1 10*3/uL (ref 0.0–0.1)
Basophils Relative: 1 %
Eosinophils Absolute: 0.2 10*3/uL (ref 0.0–0.5)
Eosinophils Relative: 3 %
HCT: 39.8 % (ref 36.0–46.0)
Hemoglobin: 13.1 g/dL (ref 12.0–15.0)
Immature Granulocytes: 0 %
Lymphocytes Relative: 29 %
Lymphs Abs: 2.4 10*3/uL (ref 0.7–4.0)
MCH: 29.8 pg (ref 26.0–34.0)
MCHC: 32.9 g/dL (ref 30.0–36.0)
MCV: 90.7 fL (ref 80.0–100.0)
Monocytes Absolute: 0.8 10*3/uL (ref 0.1–1.0)
Monocytes Relative: 9 %
Neutro Abs: 4.9 10*3/uL (ref 1.7–7.7)
Neutrophils Relative %: 58 %
Platelets: 245 10*3/uL (ref 150–400)
RBC: 4.39 MIL/uL (ref 3.87–5.11)
RDW: 13.2 % (ref 11.5–15.5)
WBC: 8.4 10*3/uL (ref 4.0–10.5)
nRBC: 0 % (ref 0.0–0.2)

## 2022-06-20 LAB — BASIC METABOLIC PANEL
Anion gap: 7 (ref 5–15)
BUN: 20 mg/dL (ref 6–20)
CO2: 24 mmol/L (ref 22–32)
Calcium: 8.8 mg/dL — ABNORMAL LOW (ref 8.9–10.3)
Chloride: 106 mmol/L (ref 98–111)
Creatinine, Ser: 0.7 mg/dL (ref 0.44–1.00)
GFR, Estimated: >60 mL/min (ref >60)
Glucose, Bld: 87 mg/dL (ref 70–99)
Potassium: 3.7 mmol/L (ref 3.5–5.1)
Sodium: 137 mmol/L (ref 135–145)

## 2022-06-20 LAB — LIPASE, BLOOD: Lipase: 26 U/L (ref 11–51)

## 2022-06-20 LAB — COMPREHENSIVE METABOLIC PANEL
ALT: 17 U/L (ref 0–44)
AST: 20 U/L (ref 15–41)
Albumin: 3.5 g/dL (ref 3.5–5.0)
Alkaline Phosphatase: 65 U/L (ref 38–126)
Anion gap: 11 (ref 5–15)
BUN: 22 mg/dL — ABNORMAL HIGH (ref 6–20)
CO2: 21 mmol/L — ABNORMAL LOW (ref 22–32)
Calcium: 8.8 mg/dL — ABNORMAL LOW (ref 8.9–10.3)
Chloride: 107 mmol/L (ref 98–111)
Creatinine, Ser: 0.95 mg/dL (ref 0.44–1.00)
GFR, Estimated: >60 mL/min (ref >60)
Glucose, Bld: 84 mg/dL (ref 70–99)
Potassium: 4.1 mmol/L (ref 3.5–5.1)
Sodium: 139 mmol/L (ref 135–145)
Total Bilirubin: 0.9 mg/dL (ref 0.3–1.2)
Total Protein: 7.5 g/dL (ref 6.5–8.1)

## 2022-06-20 LAB — URINALYSIS, ROUTINE W REFLEX MICROSCOPIC
Bilirubin Urine: NEGATIVE
Glucose, UA: NEGATIVE mg/dL
Hgb urine dipstick: NEGATIVE
Ketones, ur: NEGATIVE mg/dL
Nitrite: NEGATIVE
Protein, ur: 30 mg/dL — AB
Specific Gravity, Urine: 1.027 (ref 1.005–1.030)
WBC, UA: >50 WBC/hpf (ref 0–5)
pH: 5 (ref 5.0–8.0)

## 2022-06-20 LAB — CBG MONITORING, ED: Glucose-Capillary: 91 mg/dL (ref 70–99)

## 2022-06-20 MED ORDER — CEPHALEXIN 250 MG PO CAPS
1000.0000 mg | ORAL_CAPSULE | Freq: Once | ORAL | Status: AC
  Administered 2022-06-20: 1000 mg via ORAL
  Filled 2022-06-20: qty 4

## 2022-06-20 MED ORDER — ONDANSETRON HCL 4 MG/2ML IJ SOLN
4.0000 mg | Freq: Once | INTRAMUSCULAR | Status: AC
  Administered 2022-06-20: 4 mg via INTRAVENOUS
  Filled 2022-06-20: qty 2

## 2022-06-20 MED ORDER — PHENAZOPYRIDINE HCL 100 MG PO TABS
95.0000 mg | ORAL_TABLET | Freq: Once | ORAL | Status: AC
  Administered 2022-06-20: 100 mg via ORAL
  Filled 2022-06-20: qty 1

## 2022-06-20 MED ORDER — ONDANSETRON 4 MG PO TBDP: ORAL_TABLET | ORAL | 0 refills | Status: DC

## 2022-06-20 MED ORDER — ONDANSETRON HCL 4 MG/2ML IJ SOLN
4.0000 mg | Freq: Once | INTRAMUSCULAR | Status: DC
  Filled 2022-06-20: qty 2

## 2022-06-20 MED ORDER — LACTATED RINGERS IV BOLUS: 1000.0000 mL | Freq: Once | INTRAVENOUS | Status: DC

## 2022-06-20 MED ORDER — CEPHALEXIN 500 MG PO CAPS: 500.0000 mg | ORAL_CAPSULE | Freq: Four times a day (QID) | ORAL | 0 refills | Status: DC

## 2022-06-20 MED ORDER — ONDANSETRON 4 MG PO TBDP
4.0000 mg | ORAL_TABLET | Freq: Once | ORAL | Status: AC
  Administered 2022-06-20: 4 mg via ORAL
  Filled 2022-06-20: qty 1

## 2022-06-20 MED ORDER — OXYCODONE-ACETAMINOPHEN 5-325 MG PO TABS
1.0000 | ORAL_TABLET | Freq: Once | ORAL | Status: AC
  Administered 2022-06-20: 1 via ORAL
  Filled 2022-06-20: qty 1

## 2022-06-20 MED ORDER — SODIUM CHLORIDE 0.9 % IV BOLUS
1000.0000 mL | Freq: Once | INTRAVENOUS | Status: AC
  Administered 2022-06-20: 1000 mL via INTRAVENOUS

## 2022-06-20 MED ORDER — PROMETHAZINE HCL 25 MG PO TABS: 25.0000 mg | ORAL_TABLET | Freq: Three times a day (TID) | ORAL | 0 refills | Status: DC | PRN

## 2022-06-20 MED ORDER — ONDANSETRON 4 MG PO TBDP: 4.0000 mg | ORAL_TABLET | Freq: Three times a day (TID) | ORAL | 0 refills | Status: DC | PRN

## 2022-06-20 NOTE — Discharge Instructions (Signed)
All of your lab work is reassuring today.  I suspect this is likely related to not eating, being up late, and probably the infection.  I would like for you to drink plenty of fluids and start taking your antibiotics.  You may return to the emergency room for any worsening symptoms.

## 2022-06-20 NOTE — ED Provider Notes (Signed)
[place] EMERGENCY DEPARTMENT AT [facility] Provider Note   CSN: [id number] Arrival date & time: 06/19/22  2313     History  Chief Complaint  Patient presents with   Hematuria    [name] [name] is a 47 y.o. female.  47 yo F here with hematuria. States she has had dysuria for a couple days. Today with hematuria and worsened dysuria. States it is similar to previous UTI's. No back pain. Has had some nausea but no emesis.    Hematuria       Home Medications Prior to Admission medications   Medication Sig Start Date End Date Taking? Authorizing Provider  cephALEXin (KEFLEX) 500 MG capsule Take 1 capsule (500 mg total) by mouth 4 (four) times daily for 10 days. 06/20/22 06/30/22 Yes Setareh Rom, Barbara Cower, MD  ondansetron (ZOFRAN-ODT) 4 MG disintegrating tablet 4mg  ODT q4 hours prn nausea/vomit 06/20/22  Yes Nuha Degner, Barbara Cower, MD  albuterol (PROVENTIL HFA;VENTOLIN HFA) 108 (90 BASE) MCG/ACT inhaler Inhale 2 puffs into the lungs every 6 (six) hours as needed for wheezing or shortness of breath (SOB).    [provider]  diphenhydrAMINE (BENADRYL) 25 MG tablet Take 25 mg by mouth every 6 (six) hours as needed for allergies.    [provider]  DM-Doxylamine-Acetaminophen (NYQUIL COLD & FLU PO) Take 30 mLs by mouth daily as needed (for pain).    [provider]  docusate sodium (COLACE) 250 MG capsule Take 1 capsule (250 mg total) by mouth daily. 01/26/16   Long, Arlyss Repress, MD  ibuprofen (ADVIL,MOTRIN) 200 MG tablet Take 400 mg by mouth every 6 (six) hours as needed.    [provider]  naproxen sodium (ANAPROX) 220 MG tablet Take 220 mg by mouth 2 (two) times daily as needed (for pain).    [provider]  promethazine (PHENERGAN) 25 MG tablet Take 1 tablet (25 mg total) by mouth every 8 (eight) hours as needed for nausea or vomiting. 06/20/22   Brynlyn Dade, Barbara Cower, MD      Allergies    Aspirin, Bee venom, Sulfa antibiotics, and Ultram [tramadol]     Review of Systems   Review of Systems  Genitourinary:  Positive for hematuria.    Physical Exam Updated Vital Signs BP (!) 133/104   Pulse 65   Temp 98.2 F (36.8 C)   Resp 15   Ht 5\' 2"  (1.575 m)   Wt 130.2 kg   SpO2 96%   BMI 52.49 kg/m  Physical Exam Vitals and nursing note reviewed.  Constitutional:      Appearance: She is well-developed.  HENT:     Head: Normocephalic and atraumatic.  Eyes:     Pupils: Pupils are equal, round, and reactive to light.  Cardiovascular:     Rate and Rhythm: Normal rate and regular rhythm.  Pulmonary:     Effort: No respiratory distress.     Breath sounds: No stridor.  Abdominal:     General: Abdomen is flat. There is no distension.  Musculoskeletal:        General: No swelling. Normal range of motion.     Cervical back: Normal range of motion.  Skin:    General: Skin is warm and dry.  Neurological:     General: No focal deficit present.     Mental Status: She is alert.     ED Results / Procedures / Treatments   Labs (all labs ordered are listed, but only abnormal results are displayed) Labs Reviewed  URINALYSIS,  ROUTINE W REFLEX MICROSCOPIC - Abnormal; Notable for the following components:      Result Value   APPearance HAZY (*)    Protein, ur 30 (*)    Leukocytes,Ua MODERATE (*)    Bacteria, UA RARE (*)    All other components within normal limits  COMPREHENSIVE METABOLIC PANEL - Abnormal; Notable for the following components:   CO2 21 (*)    BUN 22 (*)    Calcium 8.8 (*)    All other components within normal limits  URINE CULTURE  LIPASE, BLOOD  CBC  CBG MONITORING, ED    EKG None  Radiology No results found.  Procedures Procedures    Medications Ordered in ED Medications  lactated ringers bolus 1,000 mL (has no administration in time range)  phenazopyridine (PYRIDIUM) tablet 100 mg (100 mg Oral Given 06/20/22 0114)  ondansetron (ZOFRAN-ODT) disintegrating tablet 4 mg (4 mg Oral Given 06/20/22 0054)   cephALEXin (KEFLEX) capsule 1,000 mg (1,000 mg Oral Given 06/20/22 0116)  oxyCODONE-acetaminophen (PERCOCET/ROXICET) 5-325 MG per tablet 1 tablet (1 tablet Oral Given 06/20/22 0119)    ED Course/ Medical Decision Making/ A&P                             Medical Decision Making Amount and/or Complexity of Data Reviewed Labs: ordered.  Risk Prescription drug management.   Eval for UTI. Less likely pyelo at this time. Will check labs to ensure no AKI.   Bmp shows mildly low bicarb. Will give some fluids/zofran.   UTI present. Treated. Comfortable.    Final Clinical Impression(s) / ED Diagnoses Final diagnoses:  Lower urinary tract infectious disease    Rx / DC Orders ED Discharge Orders          Ordered    cephALEXin (KEFLEX) 500 MG capsule  4 times daily        06/20/22 0211    ondansetron (ZOFRAN-ODT) 4 MG disintegrating tablet        06/20/22 0211    promethazine (PHENERGAN) 25 MG tablet  Every 8 hours PRN        06/20/22 0211              Lacole Komorowski, Barbara Cower, MD 06/20/22 8413

## 2022-06-20 NOTE — ED Provider Notes (Signed)
[place] EMERGENCY DEPARTMENT AT [facility] Provider Note   CSN: [id number] Arrival date & time: 06/20/22  1004     History Chief Complaint  Patient presents with  . Loss of Consciousness    [name] [name] is a 47 y.o. female with history of schizophrenia, bipolar type I disorder, obesity, and vasovagal syncope who presents to the emergency department today after a syncopal episode.  Patient was seen and evaluated here in the emergency department overnight secondary to a lower urinary tract infection and potential pyelonephritis per chart review.  She was given antibiotics here in the emergency department and prescribe some to go home with.  She states that she does not member passing out but apparently was found on a bench.  EMS found her hypoglycemic.  She denies drug use, chest pain, shortness of breath.    Loss of Consciousness      Home Medications Prior to Admission medications   Medication Sig Start Date End Date Taking? Authorizing Provider  albuterol (PROVENTIL HFA;VENTOLIN HFA) 108 (90 BASE) MCG/ACT inhaler Inhale 2 puffs into the lungs every 6 (six) hours as needed for wheezing or shortness of breath (SOB).    [provider]  cephALEXin (KEFLEX) 500 MG capsule Take 1 capsule (500 mg total) by mouth 4 (four) times daily for 10 days. 06/20/22 06/30/22  Mesner, Barbara Cower, MD  diphenhydrAMINE (BENADRYL) 25 MG tablet Take 25 mg by mouth every 6 (six) hours as needed for allergies.    [provider]  DM-Doxylamine-Acetaminophen (NYQUIL COLD & FLU PO) Take 30 mLs by mouth daily as needed (for pain).    [provider]  docusate sodium (COLACE) 250 MG capsule Take 1 capsule (250 mg total) by mouth daily. 01/26/16   Long, Arlyss Repress, MD  ibuprofen (ADVIL,MOTRIN) 200 MG tablet Take 400 mg by mouth every 6 (six) hours as needed.    [provider]  naproxen sodium (ANAPROX) 220 MG tablet Take 220 mg by mouth 2 (two) times daily as needed (for  pain).    [provider]  ondansetron (ZOFRAN-ODT) 4 MG disintegrating tablet 4mg  ODT q4 hours prn nausea/vomit 06/20/22   Mesner, Barbara Cower, MD  promethazine (PHENERGAN) 25 MG tablet Take 1 tablet (25 mg total) by mouth every 8 (eight) hours as needed for nausea or vomiting. 06/20/22   Mesner, Barbara Cower, MD      Allergies    Aspirin, Bee venom, Sulfa antibiotics, and Ultram [tramadol]    Review of Systems   Review of Systems  Cardiovascular:  Positive for syncope.  All other systems reviewed and are negative.   Physical Exam Updated Vital Signs BP 128/85   Pulse 67   Temp (!) 97.5 F (36.4 C) (Oral)   Resp 13   SpO2 96%  Physical Exam Vitals and nursing note reviewed.  Constitutional:      General: She is not in acute distress.    Appearance: She is obese.  HENT:     Head: Normocephalic and atraumatic.  Eyes:     General:        Right eye: No discharge.        Left eye: No discharge.  Cardiovascular:     Comments: Regular rate and rhythm.  S1/S2 are distinct without any evidence of murmur, rubs, or gallops.  Radial pulses are 2+ bilaterally.  Dorsalis pedis pulses are 2+ bilaterally.  No evidence of pedal edema. Pulmonary:     Comments: Clear to auscultation bilaterally.  Normal effort.  No respiratory distress.  No evidence of wheezes, rales, or rhonchi heard throughout. Abdominal:     General: Abdomen is flat. Bowel sounds are normal. There is no distension.     Tenderness: There is no abdominal tenderness. There is no guarding or rebound.  Musculoskeletal:        General: Normal range of motion.     Cervical back: Neck supple.  Skin:    General: Skin is warm and dry.     Findings: No rash.  Neurological:     General: No focal deficit present.     Mental Status: She is lethargic.  Psychiatric:        Mood and Affect: Mood normal.        Behavior: Behavior normal.     ED Results / Procedures / Treatments   Labs (all labs ordered are listed, but only abnormal  results are displayed) Labs Reviewed  BASIC METABOLIC PANEL - Abnormal; Notable for the following components:      Result Value   Calcium 8.8 (*)    All other components within normal limits  CBC WITH DIFFERENTIAL/PLATELET  CBG MONITORING, ED    EKG None  Radiology CT Head Wo Contrast  Result Date: 06/20/2022 CLINICAL DATA:  Head trauma, moderate to severe. EXAM: CT HEAD WITHOUT CONTRAST TECHNIQUE: Contiguous axial images were obtained from the base of the skull through the vertex without intravenous contrast. RADIATION DOSE REDUCTION: This exam was performed according to the departmental dose-optimization program which includes automated exposure control, adjustment of the mA and/or kV according to patient size and/or use of iterative reconstruction technique. COMPARISON:  Head CT 07/13/2013. FINDINGS: Brain: No acute intracranial hemorrhage. Gray-white differentiation is preserved. No hydrocephalus or extra-axial collection. No mass effect or midline shift. Vascular: No hyperdense vessel or unexpected calcification. Skull: No calvarial fracture or suspicious bone lesion. Skull base is unremarkable. Sinuses/Orbits: Unremarkable. Other: None. IMPRESSION: No acute intracranial abnormality. Electronically Signed   By: Orvan Falconer M.D.   On: 06/20/2022 11:25    Procedures Procedures    Medications Ordered in ED Medications  sodium chloride 0.9 % bolus 1,000 mL (1,000 mLs Intravenous New Bag/Given 06/20/22 1149)  ondansetron (ZOFRAN) injection 4 mg (4 mg Intravenous Given 06/20/22 1148)    ED Course/ Medical Decision Making/ A&P Clinical Course as of 06/20/22 1411  Thu Jun 20, 2022  1317 On reevaluation, patient is sleeping comfortably in the emergency room. [CF]  1352 Blood work all appears normal.  Sugar is normal.  This could be combination of her vasovagal syncope which has happened in the past.  Patient is resting comfortably in the emergency department with normal vital signs.   She is safe for discharge at this time. [CF]  1354 CT Head Wo Contrast I personally ordered and interpreted the study and do not see any evidence of intracranial pathology from questionable head injury.  I do agree with radiologist interpretation. [CF]  1410 Patient also requested her medications from her first visit this morning be sent to a new pharmacy.  I printed them out given her. [CF]    Clinical Course User Index [CF] Teressa Lower, PA-C   {   Click here for ABCD2, HEART and other calculators  Medical Decision Making Jenavie Depaepe is a 47 y.o. female patient who presents to the emergency department today for further evaluation after a syncopal episode.  Unknown whether or not the patient hit her head so I will plan to get a CT scan.  Patient was hypoglycemic with EMS but her sugar appears normal here.  She does have a history of vasovagal syncope which could be the likely cause.  I will plan to give her some fluids and get some basic labs and will plan to reassess.   Amount and/or Complexity of Data Reviewed Labs: ordered. Radiology: ordered. Decision-making details documented in ED Course.  Risk Prescription drug management.    Final Clinical Impression(s) / ED Diagnoses Final diagnoses:  Vasovagal syncope    Rx / DC Orders ED Discharge Orders     None         Jolyn Lent 06/20/22 1355    Gerhard Munch, MD 06/20/22 1409

## 2022-06-20 NOTE — ED Triage Notes (Addendum)
Pt bib ems from the [facility]; reports hypoglycemia and witnessed syncope; sitting on bench on ems arrival; unsure if hit head; not on thinners; CBG 105; hx vertigo; evaluated yesterday for kidney infection, has not gotten antibiotics yet; 134/92, 99% RA, HR 60 reg; pt a and o x 4 on arrival; endorses pain in lower back, chronic, as well has HA, hx migraines

## 2022-06-21 LAB — URINE CULTURE

## 2022-06-22 LAB — URINE CULTURE: Culture: 30000 — AB

## 2022-06-23 ENCOUNTER — Telehealth (HOSPITAL_BASED_OUTPATIENT_CLINIC_OR_DEPARTMENT_OTHER): Payer: Self-pay | Admitting: *Deleted

## 2022-06-23 NOTE — Telephone Encounter (Signed)
Post ED Visit - Positive Culture Follow-up  Culture report reviewed by antimicrobial stewardship pharmacist: [facility] []  Enzo Bi, Pharm.D. []  Celedonio Miyamoto, Pharm.D., BCPS AQ-ID []  Garvin Fila, Pharm.D., BCPS []  Georgina Pillion, Pharm.D., BCPS []  [place], [address].D., BCPS, AAHIVP []  Estella Husk, Pharm.D., BCPS, AAHIVP []  Lysle Pearl, PharmD, BCPS []  Phillips Climes, PharmD, BCPS []  Agapito Games, PharmD, BCPS []  Verlan Friends, PharmD []  Mervyn Gay, PharmD, BCPS [x] Riccardo Dubin, PharmD  [facility] []  Len Childs, PharmD []  Greer Pickerel, PharmD []  Adalberto Cole, PharmD []  Perlie Gold, Rph []  Lonell Face) Jean Rosenthal, PharmD []  Earl Many, PharmD []  Junita Push, PharmD []  Dorna Leitz, PharmD []  Terrilee Files, PharmD []  Lynann Beaver, PharmD []  Keturah Barre, PharmD []  Loralee Pacas, PharmD []  Bernadene Person, PharmD   Positive urine culture Treated with Cephalexin, organism sensitive to the same and no further patient follow-up is required at this time.  [name] [name] 06/23/2022, 1:11 PM

## 2022-07-22 ENCOUNTER — Emergency Department (HOSPITAL_COMMUNITY)
Admission: EM | Admit: 2022-07-22 | Discharge: 2022-07-22 | Disposition: A | Discharge status: Home / Self Care | Payer: Medicaid Other | Attending: Emergency Medicine | Admitting: Emergency Medicine

## 2022-07-22 ENCOUNTER — Encounter (HOSPITAL_COMMUNITY): Payer: Self-pay

## 2022-07-22 DIAGNOSIS — R569 Unspecified convulsions: Secondary | ICD-10-CM | POA: Diagnosis present

## 2022-07-22 DIAGNOSIS — I509 Heart failure, unspecified: Secondary | ICD-10-CM | POA: Diagnosis not present

## 2022-07-22 DIAGNOSIS — Z91138 Patient's unintentional underdosing of medication regimen for other reason: Secondary | ICD-10-CM

## 2022-07-22 DIAGNOSIS — I110 Hypertensive heart disease with heart failure: Secondary | ICD-10-CM | POA: Diagnosis not present

## 2022-07-22 DIAGNOSIS — Z91148 Patient's other noncompliance with medication regimen for other reason: Secondary | ICD-10-CM | POA: Diagnosis not present

## 2022-07-22 DIAGNOSIS — G40909 Epilepsy, unspecified, not intractable, without status epilepticus: Secondary | ICD-10-CM | POA: Insufficient documentation

## 2022-07-22 LAB — BASIC METABOLIC PANEL
Anion gap: 6 (ref 5–15)
BUN: 18 mg/dL (ref 6–20)
CO2: 24 mmol/L (ref 22–32)
Calcium: 8.6 mg/dL — ABNORMAL LOW (ref 8.9–10.3)
Chloride: 107 mmol/L (ref 98–111)
Creatinine, Ser: 0.71 mg/dL (ref 0.44–1.00)
GFR, Estimated: >60 mL/min (ref >60)
Glucose, Bld: 101 mg/dL — ABNORMAL HIGH (ref 70–99)
Potassium: 4.2 mmol/L (ref 3.5–5.1)
Sodium: 137 mmol/L (ref 135–145)

## 2022-07-22 LAB — RAPID URINE DRUG SCREEN, HOSP PERFORMED
Amphetamines: NOT DETECTED
Barbiturates: NOT DETECTED
Benzodiazepines: NOT DETECTED
Cocaine: NOT DETECTED
Opiates: NOT DETECTED
Tetrahydrocannabinol: NOT DETECTED

## 2022-07-22 LAB — CBC
HCT: 35.2 % — ABNORMAL LOW (ref 36.0–46.0)
Hemoglobin: 11.2 g/dL — ABNORMAL LOW (ref 12.0–15.0)
MCH: 29.7 pg (ref 26.0–34.0)
MCHC: 31.8 g/dL (ref 30.0–36.0)
MCV: 93.4 fL (ref 80.0–100.0)
Platelets: 217 10*3/uL (ref 150–400)
RBC: 3.77 MIL/uL — ABNORMAL LOW (ref 3.87–5.11)
RDW: 13 % (ref 11.5–15.5)
WBC: 7 10*3/uL (ref 4.0–10.5)
nRBC: 0 % (ref 0.0–0.2)

## 2022-07-22 LAB — ETHANOL: Alcohol, Ethyl (B): <10 mg/dL (ref <10)

## 2022-07-22 MED ORDER — GABAPENTIN 300 MG PO CAPS
600.0000 mg | ORAL_CAPSULE | Freq: Three times a day (TID) | ORAL | Status: DC
  Administered 2022-07-22: 600 mg via ORAL
  Filled 2022-07-22: qty 2

## 2022-07-22 MED ORDER — LAMOTRIGINE 25 MG PO TABS: 50.0000 mg | ORAL_TABLET | Freq: Every day | ORAL | 0 refills | Status: DC

## 2022-07-22 MED ORDER — TOPIRAMATE 25 MG PO TABS
100.0000 mg | ORAL_TABLET | Freq: Once | ORAL | Status: AC
  Administered 2022-07-22: 100 mg via ORAL
  Filled 2022-07-22: qty 4

## 2022-07-22 MED ORDER — LAMOTRIGINE 25 MG PO TABS
25.0000 mg | ORAL_TABLET | Freq: Every day | ORAL | Status: DC
  Administered 2022-07-22: 25 mg via ORAL
  Filled 2022-07-22: qty 1

## 2022-07-22 MED ORDER — LAMOTRIGINE 25 MG PO TABS: 100.0000 mg | ORAL_TABLET | Freq: Every day | ORAL | Status: DC

## 2022-07-22 MED ORDER — ONDANSETRON HCL 4 MG/2ML IJ SOLN
4.0000 mg | Freq: Once | INTRAMUSCULAR | Status: AC | PRN
  Administered 2022-07-22: 4 mg via INTRAVENOUS
  Filled 2022-07-22: qty 2

## 2022-07-22 MED ORDER — LAMOTRIGINE 100 MG PO TABS: 100.0000 mg | ORAL_TABLET | Freq: Every day | ORAL | 0 refills | Status: DC

## 2022-07-22 MED ORDER — LAMOTRIGINE 25 MG PO TABS: 50.0000 mg | ORAL_TABLET | Freq: Once | ORAL | Status: DC

## 2022-07-22 MED ORDER — LAMOTRIGINE 25 MG PO TABS: 25.0000 mg | ORAL_TABLET | Freq: Every day | ORAL | 0 refills | Status: DC

## 2022-07-22 MED ORDER — LAMOTRIGINE 25 MG PO TABS: 50.0000 mg | ORAL_TABLET | Freq: Every day | ORAL | Status: DC

## 2022-07-22 MED ORDER — GABAPENTIN 300 MG PO CAPS: 600.0000 mg | ORAL_CAPSULE | Freq: Three times a day (TID) | ORAL | 0 refills | Status: DC

## 2022-07-22 MED ORDER — LACTATED RINGERS IV BOLUS
500.0000 mL | Freq: Once | INTRAVENOUS | Status: AC
  Administered 2022-07-22: 500 mL via INTRAVENOUS

## 2022-07-22 MED ORDER — TOPIRAMATE 100 MG PO TABS: 100.0000 mg | ORAL_TABLET | Freq: Every day | ORAL | 0 refills | Status: DC

## 2022-07-22 NOTE — ED Notes (Signed)
Pt given sandwich bag and drink per request.

## 2022-07-22 NOTE — ED Notes (Signed)
Patient assisted to restroom via [facility] and standby assist. Returned to stretcher.

## 2022-07-22 NOTE — ED Provider Notes (Signed)
[place] EMERGENCY DEPARTMENT AT [facility] Provider Note   CSN: [id number] Arrival date & time: 07/22/22  1124     History  Chief Complaint  Patient presents with   Seizures    [name] [name] is a 47 y.o. female.  HPI  47 year old female history of seizure disorder, bipolar disorder, seizures, hypertension, CHF presents today stating that she has been not been taking her seizure medications.  She states that she was out of town on vacation.  She then returned to the [facility] where she is homeless.  She reports that she had not taken her seizure medications while on vacation and had a seizure today.  She denies injury, fever, chills.  States her medications are in [place] but cannot tell me what they are.  She states she has been on Dilantin in the past but stopped this due to problems with her teeth    Home Medications Prior to Admission medications   Medication Sig Start Date End Date Taking? Authorizing Provider  albuterol (PROVENTIL HFA;VENTOLIN HFA) 108 (90 BASE) MCG/ACT inhaler Inhale 2 puffs into the lungs every 6 (six) hours as needed for wheezing or shortness of breath (SOB).    [provider]  cephALEXin (KEFLEX) 500 MG capsule Take 1 capsule (500 mg total) by mouth 4 (four) times daily. 06/20/22   Teressa Lower, PA-C  diphenhydrAMINE (BENADRYL) 25 MG tablet Take 25 mg by mouth every 6 (six) hours as needed for allergies.    [provider]  DM-Doxylamine-Acetaminophen (NYQUIL COLD & FLU PO) Take 30 mLs by mouth daily as needed (for pain).    [provider]  docusate sodium (COLACE) 250 MG capsule Take 1 capsule (250 mg total) by mouth daily. 01/26/16   Long, Arlyss Repress, MD  ibuprofen (ADVIL,MOTRIN) 200 MG tablet Take 400 mg by mouth every 6 (six) hours as needed.    [provider]  naproxen sodium (ANAPROX) 220 MG tablet Take 220 mg by mouth 2 (two) times daily as needed (for pain).    [provider]   ondansetron (ZOFRAN-ODT) 4 MG disintegrating tablet Take 1 tablet (4 mg total) by mouth every 8 (eight) hours as needed for nausea or vomiting. 06/20/22   Teressa Lower, PA-C  promethazine (PHENERGAN) 25 MG tablet Take 1 tablet (25 mg total) by mouth every 8 (eight) hours as needed for nausea or vomiting. 06/20/22   Mesner, Barbara Cower, MD      Allergies    Aspirin, Bee venom, Sulfa antibiotics, and Ultram [tramadol]    Review of Systems   Review of Systems  Physical Exam Updated Vital Signs BP 130/75 (BP Location: Left Arm)   Pulse 63   Temp 98.7 F (37.1 C) (Oral)   Resp 20   Ht 1.575 m (5\' 2" )   Wt (!) 138.3 kg   SpO2 99%   BMI 55.79 kg/m  Physical Exam Vitals reviewed.  Constitutional:      Comments: Patient sleeping with sonorous respirations but easily aroused  HENT:     Head: Normocephalic.     Right Ear: External ear normal.     Left Ear: External ear normal.     Nose: Nose normal.     Mouth/Throat:     Pharynx: Oropharynx is clear.  Eyes:     Pupils: Pupils are equal, round, and reactive to light.  Cardiovascular:     Rate and Rhythm: Normal rate.  Pulmonary:     Effort: Pulmonary effort is normal.  Abdominal:     Palpations: Abdomen is soft.  Musculoskeletal:        General: Normal range of motion.     Cervical back: Normal range of motion.  Skin:    General: Skin is warm.  Neurological:     General: No focal deficit present.     Mental Status: She is alert.  Psychiatric:        Mood and Affect: Mood normal.     ED Results / Procedures / Treatments   Labs (all labs ordered are listed, but only abnormal results are displayed) Labs Reviewed  BASIC METABOLIC PANEL - Abnormal; Notable for the following components:      Result Value   Glucose, Bld 101 (*)    Calcium 8.6 (*)    All other components within normal limits  CBC - Abnormal; Notable for the following components:   RBC 3.77 (*)    Hemoglobin 11.2 (*)    HCT 35.2 (*)    All other  components within normal limits  ETHANOL  RAPID URINE DRUG SCREEN, HOSP PERFORMED  CBG MONITORING, ED    EKG None  Radiology No results found.  Procedures Procedures    Medications Ordered in ED Medications  gabapentin (NEURONTIN) capsule 600 mg (600 mg Oral Given 07/22/22 1447)  lamoTRIgine (LAMICTAL) tablet 25 mg (25 mg Oral Given 07/22/22 1449)  lamoTRIgine (LAMICTAL) tablet 50 mg (has no administration in time range)  lamoTRIgine (LAMICTAL) tablet 100 mg (has no administration in time range)  ondansetron (ZOFRAN) injection 4 mg (4 mg Intravenous Given 07/22/22 1208)  lactated ringers bolus 500 mL (0 mLs Intravenous Stopped 07/22/22 1510)  topiramate (TOPAMAX) tablet 100 mg (100 mg Oral Given 07/22/22 1448)    ED Course/ Medical Decision Making/ A&P Clinical Course as of 07/22/22 1524  Mon Jul 22, 2022  1520 Basic metabolic panel reviewed interpreted and within normal limits CBC is reviewed interpreted and within normal limits [DR]    Clinical Course User Index [DR] Margarita Grizzle, MD                             Medical Decision Making Amount and/or Complexity of Data Reviewed Labs: ordered.  Risk Prescription drug management.   47 year old female reports history of seizures and has been out of medicine for 3 weeks.  She reports having a seizure today.  She is currently back to baseline.  Labs are obtained and appear to be within normal limits Patient was dosed with her home medications as ascertained with consult with pharmacy. Patient appears stable for discharge.        Final Clinical Impression(s) / ED Diagnoses Final diagnoses:  Seizure (HCC)  Drug treatment stopped due to patient running out of medication    Rx / DC Orders ED Discharge Orders          Ordered    gabapentin (NEURONTIN) 300 MG capsule  3 times daily        Pending    topiramate (TOPAMAX) 100 MG tablet  Daily        Pending    lamoTRIgine (LAMICTAL) 25 MG tablet  Daily         Pending    lamoTRIgine (LAMICTAL) 25 MG tablet  Daily        Pending    lamoTRIgine (LAMICTAL) 100 MG tablet  Daily        Pending  Margarita Grizzle, MD 07/22/22 1524

## 2022-07-22 NOTE — ED Triage Notes (Signed)
Pt to ED BIB EMS from [facility] with complaint of witnessed seizure like activity by employee. Fell to ground and had seizure for approx 30 seconds. Sitting on ground and alert upon EMS arrival to scene. C/o dizziness, weakness, and nausea. Hx of epilepsy. Not currently taking seizure medications x3 weeks.

## 2022-08-07 ENCOUNTER — Encounter (HOSPITAL_COMMUNITY): Payer: Self-pay

## 2022-08-07 ENCOUNTER — Other Ambulatory Visit: Payer: Self-pay

## 2022-08-07 ENCOUNTER — Emergency Department (HOSPITAL_COMMUNITY)
Admission: EM | Admit: 2022-08-07 | Discharge: 2022-08-08 | Disposition: A | Discharge status: Home / Self Care | Payer: MEDICAID | Attending: Emergency Medicine | Admitting: Emergency Medicine

## 2022-08-07 ENCOUNTER — Emergency Department (HOSPITAL_COMMUNITY): Payer: MEDICAID

## 2022-08-07 DIAGNOSIS — R55 Syncope and collapse: Secondary | ICD-10-CM | POA: Insufficient documentation

## 2022-08-07 DIAGNOSIS — I509 Heart failure, unspecified: Secondary | ICD-10-CM | POA: Diagnosis not present

## 2022-08-07 DIAGNOSIS — R42 Dizziness and giddiness: Secondary | ICD-10-CM

## 2022-08-07 LAB — BASIC METABOLIC PANEL
Anion gap: 11 (ref 5–15)
BUN: 19 mg/dL (ref 6–20)
CO2: 21 mmol/L — ABNORMAL LOW (ref 22–32)
Calcium: 9 mg/dL (ref 8.9–10.3)
Chloride: 106 mmol/L (ref 98–111)
Creatinine, Ser: 0.98 mg/dL (ref 0.44–1.00)
GFR, Estimated: >60 mL/min (ref >60)
Glucose, Bld: 104 mg/dL — ABNORMAL HIGH (ref 70–99)
Potassium: 4 mmol/L (ref 3.5–5.1)
Sodium: 138 mmol/L (ref 135–145)

## 2022-08-07 LAB — CBC
HCT: 39.1 % (ref 36.0–46.0)
Hemoglobin: 12.8 g/dL (ref 12.0–15.0)
MCH: 30 pg (ref 26.0–34.0)
MCHC: 32.7 g/dL (ref 30.0–36.0)
MCV: 91.8 fL (ref 80.0–100.0)
Platelets: 208 10*3/uL (ref 150–400)
RBC: 4.26 MIL/uL (ref 3.87–5.11)
RDW: 13.1 % (ref 11.5–15.5)
WBC: 8.5 10*3/uL (ref 4.0–10.5)
nRBC: 0 % (ref 0.0–0.2)

## 2022-08-07 MED ORDER — SODIUM CHLORIDE 0.9 % IV BOLUS
1000.0000 mL | Freq: Once | INTRAVENOUS | Status: AC
  Administered 2022-08-07: 1000 mL via INTRAVENOUS

## 2022-08-07 MED ORDER — MECLIZINE HCL 12.5 MG PO TABS: 12.5000 mg | ORAL_TABLET | Freq: Three times a day (TID) | ORAL | 0 refills | Status: DC | PRN

## 2022-08-07 MED ORDER — MECLIZINE HCL 25 MG PO TABS
12.5000 mg | ORAL_TABLET | Freq: Once | ORAL | Status: AC
  Administered 2022-08-07: 12.5 mg via ORAL
  Filled 2022-08-07: qty 1

## 2022-08-07 NOTE — ED Provider Notes (Signed)
[place] EMERGENCY DEPARTMENT AT [facility] Provider Note   CSN: [id number] Arrival date & time: 08/07/22  1940     History  Chief Complaint  Patient presents with   Near Syncope   HPI [name] [name] is a 47 y.o. female with CHF, bipolar 1 disorder, seizures presenting for near syncope. States today she had a doctor's appointment at 830 and was not able to get a ride home until 5:30 in the afternoon.  In that time she stayed outside all day. Started feel dizzy around 530 with room spinning sensation.  States she is hardly ate or drink anything today. States she returned to the [facility] shelter and when this is stated that she "passed out" twice. LOC was only a few seconds each time. Endorses shortness of breath but no chest pain. States she has a mild headache. Also mentioned a couple weeks of diarrhea.    Near Syncope       Home Medications Prior to Admission medications   Medication Sig Start Date End Date Taking? Authorizing Provider  meclizine (ANTIVERT) 12.5 MG tablet Take 1 tablet (12.5 mg total) by mouth 3 (three) times daily as needed for dizziness. 08/07/22  Yes Gareth Eagle, PA-C  albuterol (PROVENTIL HFA;VENTOLIN HFA) 108 (90 BASE) MCG/ACT inhaler Inhale 2 puffs into the lungs every 6 (six) hours as needed for wheezing or shortness of breath (SOB).    [provider]  cephALEXin (KEFLEX) 500 MG capsule Take 1 capsule (500 mg total) by mouth 4 (four) times daily. 06/20/22   Teressa Lower, PA-C  diphenhydrAMINE (BENADRYL) 25 MG tablet Take 25 mg by mouth every 6 (six) hours as needed for allergies.    [provider]  DM-Doxylamine-Acetaminophen (NYQUIL COLD & FLU PO) Take 30 mLs by mouth daily as needed (for pain).    [provider]  docusate sodium (COLACE) 250 MG capsule Take 1 capsule (250 mg total) by mouth daily. 01/26/16   Long, Arlyss Repress, MD  gabapentin (NEURONTIN) 300 MG capsule Take 2 capsules (600 mg total) by mouth 3  (three) times daily. 07/22/22   Margarita Grizzle, MD  ibuprofen (ADVIL,MOTRIN) 200 MG tablet Take 400 mg by mouth every 6 (six) hours as needed.    [provider]  lamoTRIgine (LAMICTAL) 100 MG tablet Take 1 tablet (100 mg total) by mouth daily. 08/19/22   Margarita Grizzle, MD  lamoTRIgine (LAMICTAL) 25 MG tablet Take 1 tablet (25 mg total) by mouth daily. 07/22/22   Margarita Grizzle, MD  lamoTRIgine (LAMICTAL) 25 MG tablet Take 2 tablets (50 mg total) by mouth daily. 08/05/22   Margarita Grizzle, MD  naproxen sodium (ANAPROX) 220 MG tablet Take 220 mg by mouth 2 (two) times daily as needed (for pain).    [provider]  ondansetron (ZOFRAN-ODT) 4 MG disintegrating tablet Take 1 tablet (4 mg total) by mouth every 8 (eight) hours as needed for nausea or vomiting. 06/20/22   Teressa Lower, PA-C  promethazine (PHENERGAN) 25 MG tablet Take 1 tablet (25 mg total) by mouth every 8 (eight) hours as needed for nausea or vomiting. 06/20/22   Mesner, Barbara Cower, MD  topiramate (TOPAMAX) 100 MG tablet Take 1 tablet (100 mg total) by mouth daily. 07/22/22 08/21/22  Margarita Grizzle, MD      Allergies    Aspirin, Bee venom, Sulfa antibiotics, and Ultram [tramadol]    Review of Systems   Review of Systems  Cardiovascular:  Positive for near-syncope.    Physical  Exam   Vitals:   08/07/22 1941 08/07/22 1945  BP: (!) 142/96   Pulse: 67   Resp: 16   Temp: 98.9 F (37.2 C)   SpO2: 97% 98%    CONSTITUTIONAL:  well-appearing, NAD NEURO:  GCS 15. Speech is goal oriented. No deficits appreciated to CN III-XII; symmetric eyebrow raise, no facial drooping, tongue midline. Patient has equal grip strength bilaterally with 5/5 strength against resistance in all major muscle groups bilaterally. Sensation to light touch intact. Patient moves extremities without ataxia. Normal finger-nose-finger. Patient ambulatory with steady gait. EYES:  eyes equal and reactive ENT/NECK:  Supple, no stridor  CARDIO:  Regular rate and  rhythm, appears well-perfused  PULM:  No respiratory distress, CTAB GI/GU:  non-distended, soft MSK/SPINE:  No gross deformities, no edema, moves all extremities  SKIN:  no rash, atraumatic  *Additional and/or pertinent findings included in MDM below  ED Results / Procedures / Treatments   Labs (all labs ordered are listed, but only abnormal results are displayed) Labs Reviewed  BASIC METABOLIC PANEL - Abnormal; Notable for the following components:      Result Value   CO2 21 (*)    Glucose, Bld 104 (*)    All other components within normal limits  CBC  CBG MONITORING, ED    EKG EKG Interpretation Date/Time:  Wednesday August 07 2022 20:04:11 EDT Ventricular Rate:  66 PR Interval:  166 QRS Duration:  92 QT Interval:  420 QTC Calculation: 440 R Axis:   106  Text Interpretation: Normal sinus rhythm Anterolateral infarct , age undetermined Abnormal ECG Artifact in V3 Otherwise no significant change Confirmed by Elayne Snare ([id number]) on 08/07/2022 8:41:22 PM  Radiology CT Head Wo Contrast  Result Date: 08/07/2022 CLINICAL DATA:  Syncope. EXAM: CT HEAD WITHOUT CONTRAST TECHNIQUE: Contiguous axial images were obtained from the base of the skull through the vertex without intravenous contrast. RADIATION DOSE REDUCTION: This exam was performed according to the departmental dose-optimization program which includes automated exposure control, adjustment of the mA and/or kV according to patient size and/or use of iterative reconstruction technique. COMPARISON:  Jun 20, 2022 FINDINGS: Brain: No evidence of acute infarction, hemorrhage, hydrocephalus, extra-axial collection or mass lesion/mass effect. Vascular: No hyperdense vessel or unexpected calcification. Skull: Normal. Negative for fracture or focal lesion. Sinuses/Orbits: No acute finding. Other: None. IMPRESSION: No acute intracranial pathology. Electronically Signed   By: Aram Candela M.D.   On: 08/07/2022 21:26     Procedures Procedures    Medications Ordered in ED Medications  meclizine (ANTIVERT) tablet 12.5 mg (has no administration in time range)  sodium chloride 0.9 % bolus 1,000 mL (1,000 mLs Intravenous New Bag/Given 08/07/22 2132)    ED Course/ Medical Decision Making/ A&P                             Medical Decision Making Amount and/or Complexity of Data Reviewed Labs: ordered. Radiology: ordered.   47 year old well-appearing female presenting for syncope.  Exam was unremarkable.  DDx includes stroke, intracranial mass, arrhythmia, electrolyte derangement, dehydration, vasovagal syncope.  Workup was overall reassuring.  Labs do not indicate any acute derangement.  CT of her head was unremarkable.  EKG unremarkable.  Suspect over exposure to heat all day long likely precipitated dehydration with significant insensible losses which could be the etiology of her syncope or near syncope later in the afternoon.  Dizziness is likely peripheral.  Treated with meclizine.  Volume  resuscitated with normal saline bolus.  Vitals remained stable throughout encounter.  Advised to follow-up PCP.  Discussed pertinent return precautions.  Discharged home.        Final Clinical Impression(s) / ED Diagnoses Final diagnoses:  Syncope, unspecified syncope type  Dizziness    Rx / DC Orders ED Discharge Orders          Ordered    meclizine (ANTIVERT) 12.5 MG tablet  3 times daily PRN        08/07/22 2217              Gareth Eagle, PA-C 08/07/22 2221    Elayne Snare K, DO 08/07/22 2331

## 2022-08-07 NOTE — Discharge Instructions (Addendum)
Evaluation today was overall reassuring.  Suspect your passing out today was likely related to dehydration.  Continue assertive hydration at home with water and Gatorade.  If you pass out again, have shortness of breath, chest pain or persistent dizziness or any other concerning symptom please return emergency department further evaluation.

## 2022-08-07 NOTE — ED Triage Notes (Signed)
Pt from [facility] where she had a syncopal episode that lasted 1 minute. Had been outside in the heat all day without eating or drinking what she needed to.  Said she has a headache now and the room is spinning.

## 2022-08-08 NOTE — ED Notes (Signed)
Discharge instructions discussed with pt. Verbalized understanding.No questions or concerns regarding discharge  

## 2022-08-27 ENCOUNTER — Encounter (HOSPITAL_COMMUNITY): Payer: Self-pay

## 2022-08-27 ENCOUNTER — Emergency Department (HOSPITAL_COMMUNITY)
Admission: EM | Admit: 2022-08-27 | Discharge: 2022-08-28 | Disposition: A | Discharge status: Home / Self Care | Payer: MEDICAID | Attending: Emergency Medicine | Admitting: Emergency Medicine

## 2022-08-27 ENCOUNTER — Emergency Department (HOSPITAL_COMMUNITY): Payer: MEDICAID

## 2022-08-27 DIAGNOSIS — I2510 Atherosclerotic heart disease of native coronary artery without angina pectoris: Secondary | ICD-10-CM | POA: Insufficient documentation

## 2022-08-27 DIAGNOSIS — R531 Weakness: Secondary | ICD-10-CM | POA: Diagnosis not present

## 2022-08-27 DIAGNOSIS — R079 Chest pain, unspecified: Secondary | ICD-10-CM | POA: Insufficient documentation

## 2022-08-27 DIAGNOSIS — R519 Headache, unspecified: Secondary | ICD-10-CM

## 2022-08-27 LAB — BASIC METABOLIC PANEL
Anion gap: 12 (ref 5–15)
BUN: 13 mg/dL (ref 6–20)
CO2: 21 mmol/L — ABNORMAL LOW (ref 22–32)
Calcium: 9.2 mg/dL (ref 8.9–10.3)
Chloride: 104 mmol/L (ref 98–111)
Creatinine, Ser: 0.76 mg/dL (ref 0.44–1.00)
GFR, Estimated: >60 mL/min (ref >60)
Glucose, Bld: 114 mg/dL — ABNORMAL HIGH (ref 70–99)
Potassium: 3.6 mmol/L (ref 3.5–5.1)
Sodium: 137 mmol/L (ref 135–145)

## 2022-08-27 LAB — CBC
HCT: 40.6 % (ref 36.0–46.0)
Hemoglobin: 13.3 g/dL (ref 12.0–15.0)
MCH: 29.8 pg (ref 26.0–34.0)
MCHC: 32.8 g/dL (ref 30.0–36.0)
MCV: 91 fL (ref 80.0–100.0)
Platelets: 241 10*3/uL (ref 150–400)
RBC: 4.46 MIL/uL (ref 3.87–5.11)
RDW: 12.8 % (ref 11.5–15.5)
WBC: 7.6 10*3/uL (ref 4.0–10.5)
nRBC: 0 % (ref 0.0–0.2)

## 2022-08-27 LAB — CBG MONITORING, ED: Glucose-Capillary: 94 mg/dL (ref 70–99)

## 2022-08-27 LAB — TROPONIN I (HIGH SENSITIVITY): Troponin I (High Sensitivity): 4 ng/L (ref <18)

## 2022-08-27 NOTE — ED Triage Notes (Signed)
Pt is coming in for HTN nad chest pain, the htn has been an ongoing issue throughout the day with reported sys blood pressures at 200 per patient. She is coming form the [facility],  upon medic arrival pt started having hyperventilating leading to a feeling of having chest pain. The hyper ventilation has resolved leading to a reduced feeling of pain in her chest. Pt is otherwise stable, alert and oriented, and cooperative in triage.   Medic vitals   162/100 74hr 17rr 113bgl 97%ra  20g lt forearm

## 2022-08-28 ENCOUNTER — Emergency Department (HOSPITAL_COMMUNITY): Payer: MEDICAID

## 2022-08-28 MED ORDER — PROCHLORPERAZINE MALEATE 5 MG PO TABS
5.0000 mg | ORAL_TABLET | Freq: Once | ORAL | Status: AC
  Administered 2022-08-28: 5 mg via ORAL
  Filled 2022-08-28: qty 1

## 2022-08-28 MED ORDER — DIPHENHYDRAMINE HCL 50 MG/ML IJ SOLN
12.5000 mg | Freq: Once | INTRAMUSCULAR | Status: AC
  Administered 2022-08-28: 12.5 mg via INTRAVENOUS
  Filled 2022-08-28: qty 1

## 2022-08-28 NOTE — Discharge Instructions (Signed)
Your lab workup and imaging are reassuring like you to continue all your home medications.  I would like to follow-up with neurology for your headaches and numbness, you may also follow-up with community health and wellness for further management.  Come back to the emergency department if you develop chest pain, shortness of breath, severe abdominal pain, uncontrolled nausea, vomiting, diarrhea.

## 2022-08-28 NOTE — ED Provider Notes (Signed)
[facility] AT [facility] Provider Note   CSN: [id number] Arrival date & time: 08/27/22  2023     History  Chief Complaint  Patient presents with   Hypertension    [name] [name] is a 47 y.o. female.  HPI   Patient with medical history including bipolar, schizophrenia, housing instability, CAD status post stents, CVA with residual left left-sided weakness, presenting with complaints of high blood pressure and chest pain.  Patient states that high blood pressure has been going on since Monday, states that it fluctuates throughout the day, states that she will have intermittent paresthesias noted on her right arm and right leg, only comes on when she has a headache, will last about 30 minutes and then resolved, no actual weakness, no facial droop no slurring of her words, will have some photosensitivity without actual visual changes.  She states she does have some paresthesias on her right side currently with  a slight headache, states feels like her normal headache.  Patient states that her chest pain is also intermittent, last 1 to 3 minutes and then resolve no provoking or alleviating factors, describes as a pressure-like sensation, does not radiate, currently has none at this time.  Home Medications Prior to Admission medications   Medication Sig Start Date End Date Taking? Authorizing Provider  albuterol (PROVENTIL HFA;VENTOLIN HFA) 108 (90 BASE) MCG/ACT inhaler Inhale 2 puffs into the lungs every 6 (six) hours as needed for wheezing or shortness of breath (SOB).    [provider]  cephALEXin (KEFLEX) 500 MG capsule Take 1 capsule (500 mg total) by mouth 4 (four) times daily. 06/20/22   Teressa Lower, PA-C  diphenhydrAMINE (BENADRYL) 25 MG tablet Take 25 mg by mouth every 6 (six) hours as needed for allergies.    [provider]  DM-Doxylamine-Acetaminophen (NYQUIL COLD & FLU PO) Take 30 mLs by mouth daily as needed (for pain).     [provider]  docusate sodium (COLACE) 250 MG capsule Take 1 capsule (250 mg total) by mouth daily. 01/26/16   Long, Arlyss Repress, MD  gabapentin (NEURONTIN) 300 MG capsule Take 2 capsules (600 mg total) by mouth 3 (three) times daily. 07/22/22   Margarita Grizzle, MD  ibuprofen (ADVIL,MOTRIN) 200 MG tablet Take 400 mg by mouth every 6 (six) hours as needed.    [provider]  lamoTRIgine (LAMICTAL) 100 MG tablet Take 1 tablet (100 mg total) by mouth daily. 08/19/22   Margarita Grizzle, MD  lamoTRIgine (LAMICTAL) 25 MG tablet Take 1 tablet (25 mg total) by mouth daily. 07/22/22   Margarita Grizzle, MD  lamoTRIgine (LAMICTAL) 25 MG tablet Take 2 tablets (50 mg total) by mouth daily. 08/05/22   Margarita Grizzle, MD  meclizine (ANTIVERT) 12.5 MG tablet Take 1 tablet (12.5 mg total) by mouth 3 (three) times daily as needed for dizziness. 08/07/22   Gareth Eagle, PA-C  naproxen sodium (ANAPROX) 220 MG tablet Take 220 mg by mouth 2 (two) times daily as needed (for pain).    [provider]  ondansetron (ZOFRAN-ODT) 4 MG disintegrating tablet Take 1 tablet (4 mg total) by mouth every 8 (eight) hours as needed for nausea or vomiting. 06/20/22   Teressa Lower, PA-C  promethazine (PHENERGAN) 25 MG tablet Take 1 tablet (25 mg total) by mouth every 8 (eight) hours as needed for nausea or vomiting. 06/20/22   Mesner, Barbara Cower, MD  topiramate (TOPAMAX) 100 MG tablet Take 1 tablet (100 mg total) by  mouth daily. 07/22/22 08/21/22  Margarita Grizzle, MD      Allergies    Aspirin, Bee venom, Sulfa antibiotics, and Ultram [tramadol]    Review of Systems   Review of Systems  Constitutional:  Negative for chills and fever.  Respiratory:  Negative for shortness of breath.   Cardiovascular:  Negative for chest pain.  Gastrointestinal:  Negative for abdominal pain.  Neurological:  Positive for numbness and headaches.    Physical Exam Updated Vital Signs BP (!) 143/87 (BP Location: Right Arm)   Pulse 65    Temp 98.4 F (36.9 C) (Oral)   Resp 19   SpO2 100%  Physical Exam Vitals and nursing note reviewed.  Constitutional:      General: She is not in acute distress.    Appearance: She is not ill-appearing.  HENT:     Head: Normocephalic and atraumatic.     Nose: No congestion.  Eyes:     Conjunctiva/sclera: Conjunctivae normal.  Cardiovascular:     Rate and Rhythm: Normal rate and regular rhythm.     Pulses: Normal pulses.     Heart sounds: No murmur heard.    No friction rub. No gallop.  Pulmonary:     Effort: No respiratory distress.     Breath sounds: No wheezing, rhonchi or rales.  Abdominal:     Palpations: Abdomen is soft.     Tenderness: There is no abdominal tenderness. There is no right CVA tenderness or left CVA tenderness.  Skin:    General: Skin is warm and dry.  Neurological:     Mental Status: She is alert.     GCS: GCS eye subscore is 4. GCS verbal subscore is 5. GCS motor subscore is 6.     Cranial Nerves: Cranial nerves 2-12 are intact.     Sensory: Sensory deficit present.     Motor: Weakness present.     Coordination: Romberg sign negative. Finger-Nose-Finger Test normal.     Comments: Cranial nerves II through XII grossly intact, no difficulty with word finding, following two-step commands, she has noted left-sided weakness, 3/5 grip strength on the left compared to the right, 2/5 strength on the left leg versus the right leg.  Subjective decrease in sensation in in her right arm and her right foot.  Psychiatric:        Mood and Affect: Mood normal.     ED Results / Procedures / Treatments   Labs (all labs ordered are listed, but only abnormal results are displayed) Labs Reviewed  BASIC METABOLIC PANEL - Abnormal; Notable for the following components:      Result Value   CO2 21 (*)    Glucose, Bld 114 (*)    All other components within normal limits  CBC  CBG MONITORING, ED  TROPONIN I (HIGH SENSITIVITY)  TROPONIN I (HIGH SENSITIVITY)    EKG EKG  Interpretation Date/Time:  Tuesday August 27 2022 20:35:15 EDT Ventricular Rate:  68 PR Interval:  184 QRS Duration:  86 QT Interval:  404 QTC Calculation: 429 R Axis:   111  Text Interpretation: Normal sinus rhythm Left posterior fascicular block Anterior infarct , age undetermined Abnormal ECG When compared with ECG of 07-Aug-2022 20:04, No significant change was found Confirmed by Dione Booze ([id number]) on 08/27/2022 11:14:56 PM  Radiology CT Head Wo Contrast  Result Date: 08/28/2022 CLINICAL DATA:  Headache, increasing frequency or severity EXAM: CT HEAD WITHOUT CONTRAST TECHNIQUE: Contiguous axial images were obtained from the base of the  skull through the vertex without intravenous contrast. RADIATION DOSE REDUCTION: This exam was performed according to the departmental dose-optimization program which includes automated exposure control, adjustment of the mA and/or kV according to patient size and/or use of iterative reconstruction technique. COMPARISON:  CT head 08/07/2022 FINDINGS: Brain: No evidence of large-territorial acute infarction. No parenchymal hemorrhage. No mass lesion. No extra-axial collection. No mass effect or midline shift. No hydrocephalus. Basilar cisterns are patent. Vascular: No hyperdense vessel. Skull: No acute fracture or focal lesion. Sinuses/Orbits: Paranasal sinuses and mastoid air cells are clear. The orbits are unremarkable. Other: None. IMPRESSION: No acute intracranial abnormality. Electronically Signed   By: Tish Frederickson M.D.   On: 08/28/2022 03:27   DG Chest 2 View  Result Date: 08/27/2022 CLINICAL DATA:  Chest pain. EXAM: CHEST - 2 VIEW COMPARISON:  Chest radiograph dated 05/29/2022. FINDINGS: No focal consolidation, pleural effusion, or pneumothorax. Stable cardiac silhouette. No acute osseous pathology. IMPRESSION: No active cardiopulmonary disease. Electronically Signed   By: Elgie Collard M.D.   On: 08/27/2022 21:06    Procedures Procedures     Medications Ordered in ED Medications  prochlorperazine (COMPAZINE) tablet 5 mg (5 mg Oral Given 08/28/22 0255)  diphenhydrAMINE (BENADRYL) injection 12.5 mg (12.5 mg Intravenous Given 08/28/22 0258)    ED Course/ Medical Decision Making/ A&P                                 Medical Decision Making Amount and/or Complexity of Data Reviewed Labs: ordered. Radiology: ordered.  Risk Prescription drug management.   This patient presents to the ED for concern of high blood pressure and chest pain, this involves an extensive number of treatment options, and is a complaint that carries with it a high risk of complications and morbidity.  The differential diagnosis includes ACS, TIA, PE, ACS, CHF,    Additional history obtained:  Additional history obtained from N/A External records from outside source obtained and reviewed including cardiology notes   Co morbidities that complicate the patient evaluation  CAD, CVA,  Social Determinants of Health:  Housing stability    Lab Tests:  I Ordered, and personally interpreted labs.  The pertinent results include: CBC is unremarkable, CMP reveals CO2 of 21 glucose 114, negative delta troponin,   Imaging Studies ordered:  I ordered imaging studies including CT head, chest x-ray I independently visualized and interpreted imaging which showed both negative acute findings I agree with the radiologist interpretation   Cardiac Monitoring:  The patient was maintained on a cardiac monitor.  I personally viewed and interpreted the cardiac monitored which showed an underlying rhythm of: Without signs of ischemia   Medicines ordered and prescription drug management:  I ordered medication including migraine cocktail I have reviewed the patients home medicines and have made adjustments as needed  Critical Interventions:  N/a   Reevaluation:  Presents with chest pain and high blood pressure, triage obtain basic lab workup  imaging which I personally reviewed unremarkable, BP has remained stable, she had a benign physical exam, other than subjective paresthesias in the right side, I suspect this is likely from a migraine, will obtain CT head, provide with a migraine cocktail, and reassess.  Patient was found asleep, states her symptoms have resolved, she is agreement with discharge at this time    Consultations Obtained:  N/A    Test Considered:  N/A    Rule out I have low suspicion  for ACS as history is atypical, , EKG was sinus rhythm without signs of ischemia, patient has a negative delta troponin.  Low suspicion for PE as patient denies pleuritic chest pain, shortness of breath, patient denies leg pain, no pedal edema noted on exam, patient was PERC negative.  Low suspicion for AAA or aortic dissection as history is atypical, patient has low risk factors. low suspicion for internal head bleed and or mass as CT imaging is negative for acute findings.  Low suspicion for CVA she has no new focal deficit present my exam.  Low suspicion for dissection of the vertebral or carotid artery as presentation atypical of etiology.  I doubt TIA's presentation atypical, paresthesias or associated headaches, last about 30 seconds, there is no actual weakness, no facial droop, suspect this is a complex migraine.  Low suspicion for meningitis as she has no meningeal sign present.     Dispostion and problem list  After consideration of the diagnostic results and the patients response to treatment, I feel that the patent would benefit from discharge.  Headaches-seems consistent with a migraine, will have follow-up with neurology, given her strict return precautions.            Final Clinical Impression(s) / ED Diagnoses Final diagnoses:  Bad headache    Rx / DC Orders ED Discharge Orders     None         Barnie Del 08/28/22 [id number]    Tilden Fossa, MD 08/28/22 [phone]

## 2022-08-28 NOTE — ED Notes (Signed)
Patient transported to CT 

## 2022-09-04 ENCOUNTER — Emergency Department (HOSPITAL_COMMUNITY): Payer: MEDICAID

## 2022-09-04 ENCOUNTER — Emergency Department (HOSPITAL_COMMUNITY)
Admission: EM | Admit: 2022-09-04 | Discharge: 2022-09-05 | Disposition: A | Discharge status: Home / Self Care | Payer: MEDICAID | Attending: Emergency Medicine | Admitting: Emergency Medicine

## 2022-09-04 ENCOUNTER — Encounter (HOSPITAL_COMMUNITY): Payer: Self-pay

## 2022-09-04 ENCOUNTER — Other Ambulatory Visit: Payer: Self-pay

## 2022-09-04 DIAGNOSIS — I2510 Atherosclerotic heart disease of native coronary artery without angina pectoris: Secondary | ICD-10-CM | POA: Diagnosis not present

## 2022-09-04 DIAGNOSIS — R569 Unspecified convulsions: Secondary | ICD-10-CM | POA: Insufficient documentation

## 2022-09-04 DIAGNOSIS — R079 Chest pain, unspecified: Secondary | ICD-10-CM | POA: Insufficient documentation

## 2022-09-04 DIAGNOSIS — J45909 Unspecified asthma, uncomplicated: Secondary | ICD-10-CM | POA: Insufficient documentation

## 2022-09-04 DIAGNOSIS — Z955 Presence of coronary angioplasty implant and graft: Secondary | ICD-10-CM | POA: Diagnosis not present

## 2022-09-04 DIAGNOSIS — I509 Heart failure, unspecified: Secondary | ICD-10-CM | POA: Diagnosis not present

## 2022-09-04 DIAGNOSIS — I110 Hypertensive heart disease with heart failure: Secondary | ICD-10-CM | POA: Diagnosis not present

## 2022-09-04 DIAGNOSIS — Z79899 Other long term (current) drug therapy: Secondary | ICD-10-CM | POA: Diagnosis not present

## 2022-09-04 DIAGNOSIS — I5032 Chronic diastolic (congestive) heart failure: Secondary | ICD-10-CM | POA: Diagnosis not present

## 2022-09-04 DIAGNOSIS — I10 Essential (primary) hypertension: Secondary | ICD-10-CM | POA: Insufficient documentation

## 2022-09-04 LAB — CBC
HCT: 38.9 % (ref 36.0–46.0)
Hemoglobin: 12.6 g/dL (ref 12.0–15.0)
MCH: 29.8 pg (ref 26.0–34.0)
MCHC: 32.4 g/dL (ref 30.0–36.0)
MCV: 92 fL (ref 80.0–100.0)
Platelets: 178 10*3/uL (ref 150–400)
RBC: 4.23 MIL/uL (ref 3.87–5.11)
RDW: 13.1 % (ref 11.5–15.5)
WBC: 6.9 10*3/uL (ref 4.0–10.5)
nRBC: 0 % (ref 0.0–0.2)

## 2022-09-04 LAB — BASIC METABOLIC PANEL
Anion gap: 16 — ABNORMAL HIGH (ref 5–15)
BUN: 12 mg/dL (ref 6–20)
CO2: 20 mmol/L — ABNORMAL LOW (ref 22–32)
Calcium: 8.7 mg/dL — ABNORMAL LOW (ref 8.9–10.3)
Chloride: 104 mmol/L (ref 98–111)
Creatinine, Ser: 0.64 mg/dL (ref 0.44–1.00)
GFR, Estimated: >60 mL/min (ref >60)
Glucose, Bld: 108 mg/dL — ABNORMAL HIGH (ref 70–99)
Potassium: 4.5 mmol/L (ref 3.5–5.1)
Sodium: 140 mmol/L (ref 135–145)

## 2022-09-04 LAB — I-STAT VENOUS BLOOD GAS, ED
Acid-Base Excess: 0 mmol/L (ref 0.0–2.0)
Bicarbonate: 24.8 mmol/L (ref 20.0–28.0)
Calcium, Ion: 1.01 mmol/L — ABNORMAL LOW (ref 1.15–1.40)
HCT: 38 % (ref 36.0–46.0)
Hemoglobin: 12.9 g/dL (ref 12.0–15.0)
O2 Saturation: 99 %
Potassium: 5.4 mmol/L — ABNORMAL HIGH (ref 3.5–5.1)
Sodium: 138 mmol/L (ref 135–145)
TCO2: 26 mmol/L (ref 22–32)
pCO2, Ven: 38.1 mmHg — ABNORMAL LOW (ref 44–60)
pH, Ven: 7.421 (ref 7.25–7.43)
pO2, Ven: 130 mmHg — ABNORMAL HIGH (ref 32–45)

## 2022-09-04 LAB — TROPONIN I (HIGH SENSITIVITY): Troponin I (High Sensitivity): 7 ng/L (ref <18)

## 2022-09-04 LAB — BRAIN NATRIURETIC PEPTIDE: B Natriuretic Peptide: 29.7 pg/mL (ref 0.0–100.0)

## 2022-09-04 MED ORDER — NALOXONE HCL 0.4 MG/ML IJ SOLN
0.4000 mg | Freq: Once | INTRAMUSCULAR | Status: AC
  Administered 2022-09-04: 0.4 mg via INTRAVENOUS
  Filled 2022-09-04: qty 1

## 2022-09-04 NOTE — ED Notes (Signed)
Seizures pads in place on bed, side rails up

## 2022-09-04 NOTE — ED Triage Notes (Signed)
Pt BIBEMS s/p seizure like activity & SI, pt coming from [facility] shelter.

## 2022-09-05 ENCOUNTER — Other Ambulatory Visit: Payer: Self-pay

## 2022-09-05 ENCOUNTER — Encounter (HOSPITAL_COMMUNITY): Payer: Self-pay

## 2022-09-05 ENCOUNTER — Emergency Department (HOSPITAL_COMMUNITY): Payer: MEDICAID

## 2022-09-05 ENCOUNTER — Emergency Department (HOSPITAL_COMMUNITY)
Admission: EM | Admit: 2022-09-05 | Discharge: 2022-09-05 | Disposition: A | Discharge status: Home / Self Care | Payer: MEDICAID | Source: Home / Self Care | Attending: Emergency Medicine | Admitting: Emergency Medicine

## 2022-09-05 DIAGNOSIS — Z79899 Other long term (current) drug therapy: Secondary | ICD-10-CM | POA: Insufficient documentation

## 2022-09-05 DIAGNOSIS — I110 Hypertensive heart disease with heart failure: Secondary | ICD-10-CM | POA: Insufficient documentation

## 2022-09-05 DIAGNOSIS — R569 Unspecified convulsions: Secondary | ICD-10-CM | POA: Insufficient documentation

## 2022-09-05 DIAGNOSIS — I509 Heart failure, unspecified: Secondary | ICD-10-CM | POA: Insufficient documentation

## 2022-09-05 DIAGNOSIS — J45909 Unspecified asthma, uncomplicated: Secondary | ICD-10-CM | POA: Insufficient documentation

## 2022-09-05 LAB — BASIC METABOLIC PANEL
Anion gap: 8 (ref 5–15)
BUN: 17 mg/dL (ref 6–20)
CO2: 24 mmol/L (ref 22–32)
Calcium: 8.4 mg/dL — ABNORMAL LOW (ref 8.9–10.3)
Chloride: 106 mmol/L (ref 98–111)
Creatinine, Ser: 0.71 mg/dL (ref 0.44–1.00)
GFR, Estimated: >60 mL/min (ref >60)
Glucose, Bld: 89 mg/dL (ref 70–99)
Potassium: 3.6 mmol/L (ref 3.5–5.1)
Sodium: 138 mmol/L (ref 135–145)

## 2022-09-05 LAB — CBC WITH DIFFERENTIAL/PLATELET
Abs Immature Granulocytes: 0.04 10*3/uL (ref 0.00–0.07)
Basophils Absolute: 0.1 10*3/uL (ref 0.0–0.1)
Basophils Relative: 1 %
Eosinophils Absolute: 0.2 10*3/uL (ref 0.0–0.5)
Eosinophils Relative: 1 %
HCT: 37.8 % (ref 36.0–46.0)
Hemoglobin: 12.3 g/dL (ref 12.0–15.0)
Immature Granulocytes: 0 %
Lymphocytes Relative: 23 %
Lymphs Abs: 2.4 10*3/uL (ref 0.7–4.0)
MCH: 30.6 pg (ref 26.0–34.0)
MCHC: 32.5 g/dL (ref 30.0–36.0)
MCV: 94 fL (ref 80.0–100.0)
Monocytes Absolute: 0.8 10*3/uL (ref 0.1–1.0)
Monocytes Relative: 7 %
Neutro Abs: 7 10*3/uL (ref 1.7–7.7)
Neutrophils Relative %: 68 %
Platelets: 243 10*3/uL (ref 150–400)
RBC: 4.02 MIL/uL (ref 3.87–5.11)
RDW: 13.2 % (ref 11.5–15.5)
WBC: 10.4 10*3/uL (ref 4.0–10.5)
nRBC: 0 % (ref 0.0–0.2)

## 2022-09-05 LAB — PREGNANCY, URINE: Preg Test, Ur: NEGATIVE

## 2022-09-05 LAB — ETHANOL: Alcohol, Ethyl (B): 80 mg/dL — ABNORMAL HIGH (ref <10)

## 2022-09-05 MED ORDER — GABAPENTIN 300 MG PO CAPS: 600.0000 mg | ORAL_CAPSULE | Freq: Three times a day (TID) | ORAL | 0 refills | Status: DC

## 2022-09-05 MED ORDER — TOPIRAMATE 100 MG PO TABS: 100.0000 mg | ORAL_TABLET | Freq: Every day | ORAL | Status: DC

## 2022-09-05 MED ORDER — GABAPENTIN 300 MG PO CAPS
600.0000 mg | ORAL_CAPSULE | Freq: Three times a day (TID) | ORAL | Status: DC
  Administered 2022-09-05 (×2): 600 mg via ORAL
  Filled 2022-09-05 (×2): qty 2

## 2022-09-05 MED ORDER — LAMOTRIGINE 25 MG PO TABS
25.0000 mg | ORAL_TABLET | Freq: Once | ORAL | Status: AC
  Administered 2022-09-05: 25 mg via ORAL
  Filled 2022-09-05: qty 1

## 2022-09-05 MED ORDER — LAMOTRIGINE 25 MG PO TABS: ORAL_TABLET | ORAL | 0 refills | Status: DC

## 2022-09-05 MED ORDER — LACTATED RINGERS IV BOLUS
1000.0000 mL | Freq: Once | INTRAVENOUS | Status: AC
  Administered 2022-09-05: 1000 mL via INTRAVENOUS

## 2022-09-05 MED ORDER — METOCLOPRAMIDE HCL 5 MG/ML IJ SOLN
5.0000 mg | Freq: Once | INTRAMUSCULAR | Status: AC
  Administered 2022-09-05: 5 mg via INTRAVENOUS
  Filled 2022-09-05: qty 2

## 2022-09-05 MED ORDER — ACETAMINOPHEN 500 MG PO TABS
1000.0000 mg | ORAL_TABLET | Freq: Once | ORAL | Status: AC
  Administered 2022-09-05: 1000 mg via ORAL
  Filled 2022-09-05: qty 2

## 2022-09-05 NOTE — Discharge Instructions (Addendum)
Your work-up today was reassuring.   Please follow-up with your primary care doctor.  If you do not have one, try and be seen at the wellness clinic. Return here for new concerns.

## 2022-09-05 NOTE — ED Triage Notes (Signed)
BIB [facility] from [facility] for sz activity, h/o sx, seen at [facility] last night, d/c'd at 0100 back to [facility]. Bystander noted possible sz activity this am, fall w/o injury, no incontinence or oral trauma, no known postictal period, A&O for EMS. C/o HA, h/o migraines. No sz activity for EMS.

## 2022-09-05 NOTE — ED Provider Notes (Signed)
  Assumed care at shift change, see prior notes for full H&P.  Briefly, 47 y.o. F initially here with multiple complaints, notably chest pain.  She was somewhat somnolent with prior team-- admits to EtOH on board and there was suspicion for substance abuse.  Work-up grossly reassuring-- negative CT head, CXR, trops negative x2.  Ethanol 80.  Patient has been observed here.  She was initially on a few liters of O2, however removed from this and able to maintain 97% or greater.  She has been sleeping for a while now.  I have woken her up to review results and she states "I just want to sleep".  She denies having seizure today.  Denies SI to myself as well as to previous provider.  Feel she is stable for discharge.  Will have her follow-up with PCP.  Return here for new concerns.   Garlon Hatchet, PA-C 09/05/22 0237    Melene Plan, DO 09/05/22 [id number]

## 2022-09-05 NOTE — Discharge Instructions (Addendum)
You are seen today after a seizure.  This is likely due to not taking her medication.  I sent a refill for your gabapentin and Lamictal to the pharmacy.  The Lamictal needs to be titrated up, follow directions closely.  If you develop rash, repeat seizures or any other new concerning symptoms you should return to the ED.  You should call your primary care doctor and neurologist to schedule follow-up.

## 2022-09-05 NOTE — ED Provider Notes (Signed)
[place] EMERGENCY DEPARTMENT AT [facility] Provider Note   CSN: [id number] Arrival date & time: 09/05/22  1148     History  Chief Complaint  Patient presents with   Seizures    [name] [name] is a 47 y.o. female.   Seizures 47 year old female history of asthma, bipolar 1, CHF, hypertension, seizures presenting for seizure.  Patient states she had a relapse of alcohol yesterday.  She drinks and developed a headache.  She was seen in the ED yesterday.  She had reassuring workup including CT head and lab work and was discharged home.  She went back to the [facility] where she reportedly had a generalized seizure.  She is not sure for how long.  She states she has not taken her seizure meds including Lamictal, gabapentin, Topamax for about a week.  She reports history of similar seizures.  She did hit her head and has some pain to the right posterior aspect of her head as well as a migraine currently.  No weakness or numbness or vision changes.  No SI or HI.  She has no other new symptoms.     Home Medications Prior to Admission medications   Medication Sig Start Date End Date Taking? Authorizing Provider  albuterol (PROVENTIL HFA;VENTOLIN HFA) 108 (90 BASE) MCG/ACT inhaler Inhale 2 puffs into the lungs every 6 (six) hours as needed for wheezing or shortness of breath (SOB).    [provider]  cephALEXin (KEFLEX) 500 MG capsule Take 1 capsule (500 mg total) by mouth 4 (four) times daily. 06/20/22   Teressa Lower, PA-C  diphenhydrAMINE (BENADRYL) 25 MG tablet Take 25 mg by mouth every 6 (six) hours as needed for allergies.    [provider]  DM-Doxylamine-Acetaminophen (NYQUIL COLD & FLU PO) Take 30 mLs by mouth daily as needed (for pain).    [provider]  docusate sodium (COLACE) 250 MG capsule Take 1 capsule (250 mg total) by mouth daily. 01/26/16   Long, Arlyss Repress, MD  gabapentin (NEURONTIN) 300 MG capsule Take 2 capsules (600 mg total) by mouth  3 (three) times daily. 09/05/22   Laurence Spates, MD  ibuprofen (ADVIL,MOTRIN) 200 MG tablet Take 400 mg by mouth every 6 (six) hours as needed.    [provider]  lamoTRIgine (LAMICTAL) 25 MG tablet Take 1 tablet (25 mg total) by mouth daily for 14 days, THEN 2 tablets (50 mg total) daily for 14 days. 09/05/22 10/03/22  Laurence Spates, MD  meclizine (ANTIVERT) 12.5 MG tablet Take 1 tablet (12.5 mg total) by mouth 3 (three) times daily as needed for dizziness. 08/07/22   Gareth Eagle, PA-C  naproxen sodium (ANAPROX) 220 MG tablet Take 220 mg by mouth 2 (two) times daily as needed (for pain).    [provider]  ondansetron (ZOFRAN-ODT) 4 MG disintegrating tablet Take 1 tablet (4 mg total) by mouth every 8 (eight) hours as needed for nausea or vomiting. 06/20/22   Teressa Lower, PA-C  promethazine (PHENERGAN) 25 MG tablet Take 1 tablet (25 mg total) by mouth every 8 (eight) hours as needed for nausea or vomiting. 06/20/22   Mesner, Barbara Cower, MD      Allergies    Aspirin, Bee venom, Sulfa antibiotics, and Ultram [tramadol]    Review of Systems   Review of Systems  Neurological:  Positive for seizures.  Review of systems completed and notable as per HPI.  ROS otherwise negative.   Physical Exam Updated Vital  Signs BP (!) 141/97   Pulse 78   Temp 98.3 F (36.8 C) (Oral)   Resp 18   Ht 5\' 2"  (1.575 m)   Wt (!) 139.5 kg   SpO2 98%   BMI 56.25 kg/m  Physical Exam Vitals and nursing note reviewed.  Constitutional:      General: She is not in acute distress.    Appearance: She is well-developed. She is obese. She is not ill-appearing.  HENT:     Head: Normocephalic and atraumatic.     Nose: Nose normal.     Mouth/Throat:     Mouth: Mucous membranes are moist.     Pharynx: Oropharynx is clear.  Eyes:     Extraocular Movements: Extraocular movements intact.     Conjunctiva/sclera: Conjunctivae normal.     Pupils: Pupils are equal, round, and reactive to light.   Cardiovascular:     Rate and Rhythm: Normal rate and regular rhythm.     Heart sounds: No murmur heard. Pulmonary:     Effort: Pulmonary effort is normal. No respiratory distress.     Breath sounds: Normal breath sounds.  Abdominal:     Palpations: Abdomen is soft.     Tenderness: There is no abdominal tenderness. There is no guarding or rebound.  Musculoskeletal:        General: No swelling.     Cervical back: Normal range of motion and neck supple. No rigidity or tenderness.  Skin:    General: Skin is warm and dry.     Capillary Refill: Capillary refill takes less than 2 seconds.  Neurological:     General: No focal deficit present.     Mental Status: She is alert and oriented to person, place, and time. Mental status is at baseline.     Cranial Nerves: No cranial nerve deficit.     Sensory: No sensory deficit.     Motor: No weakness.     Coordination: Coordination normal.     Deep Tendon Reflexes: Reflexes normal.  Psychiatric:        Mood and Affect: Mood normal.     ED Results / Procedures / Treatments   Labs (all labs ordered are listed, but only abnormal results are displayed) Labs Reviewed  BASIC METABOLIC PANEL - Abnormal; Notable for the following components:      Result Value   Calcium 8.4 (*)    All other components within normal limits  CBC WITH DIFFERENTIAL/PLATELET  PREGNANCY, URINE    EKG None  Radiology CT Head Wo Contrast  Result Date: 09/05/2022 CLINICAL DATA:  Seizure, head injury EXAM: CT HEAD WITHOUT CONTRAST TECHNIQUE: Contiguous axial images were obtained from the base of the skull through the vertex without intravenous contrast. RADIATION DOSE REDUCTION: This exam was performed according to the departmental dose-optimization program which includes automated exposure control, adjustment of the mA and/or kV according to patient size and/or use of iterative reconstruction technique. COMPARISON:  CT head 09/03/2021 FINDINGS: Brain: No evidence of  large-territorial acute infarction. No parenchymal hemorrhage. No mass lesion. No extra-axial collection. No mass effect or midline shift. No hydrocephalus. Basilar cisterns are patent. Vascular: No hyperdense vessel. Skull: No acute fracture or focal lesion. Sinuses/Orbits: Paranasal sinuses and mastoid air cells are clear. The orbits are unremarkable. Other: None. IMPRESSION: No acute intracranial abnormality. Electronically Signed   By: Tish Frederickson M.D.   On: 09/05/2022 13:10   DG Chest 2 View  Result Date: 09/04/2022 CLINICAL DATA:  Chest pain, status post  seizure-like activity. EXAM: CHEST - 2 VIEW COMPARISON:  August 27, 2022 FINDINGS: The cardiac silhouette is mildly enlarged. This may be technical in nature. Low lung volumes are seen with mild elevation of the right hemidiaphragm. Subsequent crowding of the bronchovascular lung markings is seen. There is mild to moderate severity prominence of the perihilar pulmonary vasculature. There is no evidence of focal consolidation, pleural effusion or pneumothorax. The visualized skeletal structures are unremarkable. IMPRESSION: Low lung volumes with mild to moderate severity pulmonary vascular congestion. Electronically Signed   By: Aram Candela M.D.   On: 09/04/2022 21:55   CT Head Wo Contrast  Result Date: 09/04/2022 CLINICAL DATA:  Headache with increasing frequency or severity. EXAM: CT HEAD WITHOUT CONTRAST TECHNIQUE: Contiguous axial images were obtained from the base of the skull through the vertex without intravenous contrast. RADIATION DOSE REDUCTION: This exam was performed according to the departmental dose-optimization program which includes automated exposure control, adjustment of the mA and/or kV according to patient size and/or use of iterative reconstruction technique. COMPARISON:  Head CT 08/28/2022 FINDINGS: Brain: No evidence of acute infarction, hemorrhage, hydrocephalus, extra-axial collection or mass lesion/mass effect. Basal  cisterns are clear. Vascular: No hyperdense vessel or unexpected calcification. Skull: Normal. Negative for fracture or focal lesion. Sinuses/Orbits: There is mild membrane thickening in the ethmoid and maxillary sinuses, seen previously. The remaining paranasal sinuses, bilateral mastoid air cells, middle ears are clear. Unremarkable orbits. Other: None. IMPRESSION: 1. No acute intracranial CT findings or interval changes. 2. Sinus membrane disease.  No fluid levels. Electronically Signed   By: Almira Bar M.D.   On: 09/04/2022 21:37    Procedures Procedures    Medications Ordered in ED Medications  gabapentin (NEURONTIN) capsule 600 mg (600 mg Oral Given 09/05/22 1542)  acetaminophen (TYLENOL) tablet 1,000 mg (has no administration in time range)  lactated ringers bolus 1,000 mL (0 mLs Intravenous Stopped 09/05/22 1536)  lamoTRIgine (LAMICTAL) tablet 25 mg (25 mg Oral Given 09/05/22 1317)  metoCLOPramide (REGLAN) injection 5 mg (5 mg Intravenous Given 09/05/22 1437)    ED Course/ Medical Decision Making/ A&P                                 Medical Decision Making Amount and/or Complexity of Data Reviewed Labs: ordered. Radiology: ordered.  Risk OTC drugs. Prescription drug management.   Medical Decision Making:   [name] [name] is a 47 y.o. female who presented to the ED today with headache and seizure.  On exam she is well-appearing, normal neurologic exam without signs of stroke or deficit.  Reportedly had generalized seizure with EMS but is now back to baseline.  Suspect likely breakthrough seizure in setting of medication nonadherence for the last week.  Will give home medication here.  Will obtain CT head given secondary headache and head trauma.  No signs of C-spine trauma or other acute abnormality.   Patient placed on continuous vitals and telemetry monitoring while in ED which was reviewed periodically.  Reviewed and confirmed nursing documentation for past medical history,  family history, social history.  Initial Study Results:   Laboratory  All laboratory results reviewed.  Labs notable for CBC, BMP unremarkable.    Radiology:  All images reviewed independently.  No acute intracranial abnormality.  Agree with radiology report at this time.    Reassessment and Plan:   On reassessment she is feeling better.  No additional seizures.  She was  given her home gabapentin and Lamictal.  I discussed with pharmacy, they recommend retitrating up her Lamictal which I discussed with the patient, given prescription for 25 mg for 2 weeks followed by 50 mg for 2 weeks.  It appears she is not taking Topamax currently and this interacts with her other medications.  I refilled her prescription for gabapentin as well.  I discussed this in detail with the patient and recommend she call her neurologist and PCP today.  I gave her strict return precautions for any new or worsening symptoms.  She was comfortable this plan.  Discharged in stable condition.   Patient's presentation is most consistent with acute complicated illness / injury requiring diagnostic workup.           Final Clinical Impression(s) / ED Diagnoses Final diagnoses:  Seizure (HCC)    Rx / DC Orders ED Discharge Orders          Ordered    lamoTRIgine (LAMICTAL) 25 MG tablet  Daily        09/05/22 1601    gabapentin (NEURONTIN) 300 MG capsule  3 times daily        09/05/22 1601              Laurence Spates, MD 09/05/22 [phone]

## 2022-09-05 NOTE — ED Notes (Signed)
Sleeping, easily falls asleep, preoccupied with sleep, sonorous resps, arousable to voice, able to hold conversation, falls back to sleep.

## 2022-09-05 NOTE — ED Provider Notes (Signed)
[name] [name] AT [facility] Provider Note   CSN: [id number] Arrival date & time: 09/04/22  1944     History  Chief Complaint  Patient presents with   Seizures    [name] [name] is a 47 y.o. female.  Patient with history of schizophrenia, seizures, hep c, CAD s/p stent in 2004, polysubstance abuse, htn, HFpEF, morbid obesity presents today with multiple complaints. Patient very sleepy and difficult to arouse on my evaluation.  Apparently, she told EMS that she was suicidal and had a seizure today.  Upon my evaluation, patient states that she has not had a seizure, she takes her Lamictal as prescribed every day and is not suicidal.  I repeatedly clarified this with her and she continued to deny any suicidal ideation.  She notes to me that she is having chest pain.  She denies any shortness of breath, however is currently on oxygen.  She denies any history of requiring oxygen previously.  She states that she took "a sleeping pill today" and drink "a little bit" of alcohol today.  She is unable to clarify her symptoms further.  The history is provided by the patient. No language interpreter was used.  Seizures      Home Medications Prior to Admission medications   Medication Sig Start Date End Date Taking? Authorizing Provider  albuterol (PROVENTIL HFA;VENTOLIN HFA) 108 (90 BASE) MCG/ACT inhaler Inhale 2 puffs into the lungs every 6 (six) hours as needed for wheezing or shortness of breath (SOB).    [provider]  cephALEXin (KEFLEX) 500 MG capsule Take 1 capsule (500 mg total) by mouth 4 (four) times daily. 06/20/22   Teressa Lower, PA-C  diphenhydrAMINE (BENADRYL) 25 MG tablet Take 25 mg by mouth every 6 (six) hours as needed for allergies.    [provider]  DM-Doxylamine-Acetaminophen (NYQUIL COLD & FLU PO) Take 30 mLs by mouth daily as needed (for pain).    [provider]  docusate sodium (COLACE) 250 MG capsule Take 1  capsule (250 mg total) by mouth daily. 01/26/16   Long, Arlyss Repress, MD  gabapentin (NEURONTIN) 300 MG capsule Take 2 capsules (600 mg total) by mouth 3 (three) times daily. 07/22/22   Margarita Grizzle, MD  ibuprofen (ADVIL,MOTRIN) 200 MG tablet Take 400 mg by mouth every 6 (six) hours as needed.    [provider]  lamoTRIgine (LAMICTAL) 100 MG tablet Take 1 tablet (100 mg total) by mouth daily. 08/19/22   Margarita Grizzle, MD  lamoTRIgine (LAMICTAL) 25 MG tablet Take 1 tablet (25 mg total) by mouth daily. 07/22/22   Margarita Grizzle, MD  lamoTRIgine (LAMICTAL) 25 MG tablet Take 2 tablets (50 mg total) by mouth daily. 08/05/22   Margarita Grizzle, MD  meclizine (ANTIVERT) 12.5 MG tablet Take 1 tablet (12.5 mg total) by mouth 3 (three) times daily as needed for dizziness. 08/07/22   Gareth Eagle, PA-C  naproxen sodium (ANAPROX) 220 MG tablet Take 220 mg by mouth 2 (two) times daily as needed (for pain).    [provider]  ondansetron (ZOFRAN-ODT) 4 MG disintegrating tablet Take 1 tablet (4 mg total) by mouth every 8 (eight) hours as needed for nausea or vomiting. 06/20/22   Teressa Lower, PA-C  promethazine (PHENERGAN) 25 MG tablet Take 1 tablet (25 mg total) by mouth every 8 (eight) hours as needed for nausea or vomiting. 06/20/22   Mesner, Barbara Cower, MD  topiramate (TOPAMAX) 100 MG tablet Take 1 tablet (100  mg total) by mouth daily. 07/22/22 08/21/22  Margarita Grizzle, MD      Allergies    Aspirin, Bee venom, Sulfa antibiotics, and Ultram [tramadol]    Review of Systems   Review of Systems  All other systems reviewed and are negative.   Physical Exam Updated Vital Signs BP (!) 160/95 (BP Location: Left Arm)   Pulse 76   Temp 99.1 F (37.3 C) (Oral)   Resp 17   Ht 5\' 2"  (1.575 m)   Wt (!) 139.5 kg   SpO2 95%   BMI 56.25 kg/m  Physical Exam Vitals and nursing note reviewed.  Constitutional:      General: She is not in acute distress.    Appearance: Normal appearance. She is normal  weight. She is not ill-appearing, toxic-appearing or diaphoretic.  HENT:     Head: Normocephalic and atraumatic.  Eyes:     Extraocular Movements: Extraocular movements intact.     Pupils: Pupils are equal, round, and reactive to light.  Cardiovascular:     Rate and Rhythm: Normal rate and regular rhythm.     Heart sounds: Normal heart sounds.  Pulmonary:     Effort: Pulmonary effort is normal. No respiratory distress.     Breath sounds: Normal breath sounds.  Abdominal:     General: Abdomen is flat.     Palpations: Abdomen is soft.     Tenderness: There is no abdominal tenderness.  Musculoskeletal:        General: Normal range of motion.     Cervical back: Normal range of motion.  Skin:    General: Skin is warm and dry.  Neurological:     General: No focal deficit present.     Mental Status: She is alert.     Comments: Very sleepy, will arouse to painful stimuli and answer a few questions and then falls back asleep. Is moving all extremities equally. Patient fully responds and answers questions when IV is placed and is alert and oriented and neurologically intact without focal deficits.  When painful stimuli ceases, she immediately falls back asleep.  Psychiatric:        Mood and Affect: Mood normal.        Behavior: Behavior normal.     ED Results / Procedures / Treatments   Labs (all labs ordered are listed, but only abnormal results are displayed) Labs Reviewed  BASIC METABOLIC PANEL - Abnormal; Notable for the following components:      Result Value   CO2 20 (*)    Glucose, Bld 108 (*)    Calcium 8.7 (*)    Anion gap 16 (*)    All other components within normal limits  ETHANOL - Abnormal; Notable for the following components:   Alcohol, Ethyl (B) 80 (*)    All other components within normal limits  I-STAT VENOUS BLOOD GAS, ED - Abnormal; Notable for the following components:   pCO2, Ven 38.1 (*)    pO2, Ven 130 (*)    Potassium 5.4 (*)    Calcium, Ion 1.01 (*)     All other components within normal limits  CBC  BRAIN NATRIURETIC PEPTIDE  URINALYSIS, ROUTINE W REFLEX MICROSCOPIC  RAPID URINE DRUG SCREEN, HOSP PERFORMED  LAMOTRIGINE LEVEL  CBG MONITORING, ED  TROPONIN I (HIGH SENSITIVITY)  TROPONIN I (HIGH SENSITIVITY)    EKG None  Radiology DG Chest 2 View  Result Date: 09/04/2022 CLINICAL DATA:  Chest pain, status post seizure-like activity. EXAM: CHEST - 2  VIEW COMPARISON:  August 27, 2022 FINDINGS: The cardiac silhouette is mildly enlarged. This may be technical in nature. Low lung volumes are seen with mild elevation of the right hemidiaphragm. Subsequent crowding of the bronchovascular lung markings is seen. There is mild to moderate severity prominence of the perihilar pulmonary vasculature. There is no evidence of focal consolidation, pleural effusion or pneumothorax. The visualized skeletal structures are unremarkable. IMPRESSION: Low lung volumes with mild to moderate severity pulmonary vascular congestion. Electronically Signed   By: Aram Candela M.D.   On: 09/04/2022 21:55   CT Head Wo Contrast  Result Date: 09/04/2022 CLINICAL DATA:  Headache with increasing frequency or severity. EXAM: CT HEAD WITHOUT CONTRAST TECHNIQUE: Contiguous axial images were obtained from the base of the skull through the vertex without intravenous contrast. RADIATION DOSE REDUCTION: This exam was performed according to the departmental dose-optimization program which includes automated exposure control, adjustment of the mA and/or kV according to patient size and/or use of iterative reconstruction technique. COMPARISON:  Head CT 08/28/2022 FINDINGS: Brain: No evidence of acute infarction, hemorrhage, hydrocephalus, extra-axial collection or mass lesion/mass effect. Basal cisterns are clear. Vascular: No hyperdense vessel or unexpected calcification. Skull: Normal. Negative for fracture or focal lesion. Sinuses/Orbits: There is mild membrane thickening in the  ethmoid and maxillary sinuses, seen previously. The remaining paranasal sinuses, bilateral mastoid air cells, middle ears are clear. Unremarkable orbits. Other: None. IMPRESSION: 1. No acute intracranial CT findings or interval changes. 2. Sinus membrane disease.  No fluid levels. Electronically Signed   By: Almira Bar M.D.   On: 09/04/2022 21:37    Procedures Procedures    Medications Ordered in ED Medications  naloxone [facility]) injection 0.4 mg (0.4 mg Intravenous Given 09/04/22 2349)    ED Course/ Medical Decision Making/ A&P                                 Medical Decision Making Amount and/or Complexity of Data Reviewed Labs: ordered. Radiology: ordered.  Risk Prescription drug management.   This patient is a 47 y.o. female who presents to the ED for concern of unclear etiology, EMS reports seizures and SI, patient denies this, she notes chest pain, this involves an extensive number of treatment options, and is a complaint that carries with it a high risk of complications and morbidity. The emergent differential diagnosis prior to evaluation includes, but is not limited to,  polypharmacy . This is not an exhaustive differential.   Past Medical History / Co-morbidities / Social History: history of schizophrenia, seizures, hep c, CAD s/p stent in 2004, polysubstance abuse, htn, HFpEF, morbid obesity  Additional history: Chart reviewed. Pertinent results include: patient with several recent ER visits for various symptoms. Appears she is not normally as somnolent during these visits.   Physical Exam: Physical exam performed. The pertinent findings include: responds to painful stimuli, moves extremities equally  Lab Tests: I ordered, and personally interpreted labs.  The pertinent results include: No leukocytosis, bicarb 20, anion gap 16, potentially due to alcoholic ketoacidosis.  Ethanol 80.  Delta troponin negative.  VBG with CO2 38.  UDS pending BMP WNL   Imaging  Studies: I ordered imaging studies including CT head, CXR. I independently visualized and interpreted imaging which showed   CT 1. No acute intracranial CT findings or interval changes. 2. Sinus membrane disease.  No fluid levels.  CXR: Low lung volumes with mild to moderate severity pulmonary  vascular congestion.  I agree with the radiologist interpretation.   Cardiac Monitoring:  The patient was maintained on a cardiac monitor.  My attending physician Dr. Eloise Harman viewed and interpreted the cardiac monitored which showed an underlying rhythm of: sinus rhythm, no STEMI. I agree with this interpretation.   Medications: I ordered medication including narcan  for concern for OD. Reevaluation of the patient after these medicines showed that the patient stayed the same. I have reviewed the patients home medicines and have made adjustments as needed.   Disposition:  After several hours of monitoring, patient continues to be somnolent. No improvement with narcan. Labs benign. Maintaining her airway. She does desat to 90 on room air when she sleeps, however she is morbidly obese and snores loudly.  Suspect OSA versus obesity hypoventilation.  No signs or symptoms to suspect PE, ACS, CVA, or seizures.  She repeatedly denies SI.  Suspect symptoms likely related to polypharmacy with alcohol use, which reviewed patient has history of similar.  Patient would like time to metabolize and then reevaluate.  She is also homeless and from the [facility].  Potentially after few hours of rest she may improve and feel ready to go home.  Patient will need reevaluation to determine dispo.  If she wakes up then she should be good to go home.  Care handoff to Sharilyn Sites, PA-C at shift change. Please see their note for continued evaluation and dispo   This is a shared visit with supervising physician Dr. Eloise Harman who has independently evaluated patient & provided guidance in evaluation/management/disposition, in agreement  with care    Final Clinical Impression(s) / ED Diagnoses Final diagnoses:  None    Rx / DC Orders ED Discharge Orders     None         Vear Clock 09/06/22 0027    Rondel Baton, MD 09/06/22 1242

## 2022-09-05 NOTE — ED Notes (Signed)
To radiology via stretcher, alert, NAD, calm, interactive.

## 2022-09-24 ENCOUNTER — Inpatient Hospital Stay
Admission: AD | Admit: 2022-09-24 | Discharge: 2022-10-01 | DRG: 885 | Disposition: A | Discharge status: Home / Self Care | Payer: MEDICAID | Source: Intra-hospital | Attending: Psychiatry | Admitting: Psychiatry

## 2022-09-24 ENCOUNTER — Encounter: Payer: Self-pay | Admitting: Psychiatry

## 2022-09-24 ENCOUNTER — Other Ambulatory Visit: Payer: Self-pay

## 2022-09-24 ENCOUNTER — Ambulatory Visit (HOSPITAL_COMMUNITY)
Admission: EM | Admit: 2022-09-24 | Discharge: 2022-09-24 | Disposition: A | Discharge status: Other Acute Inpatient Hospital | Payer: MEDICAID | Attending: Psychiatry | Admitting: Psychiatry

## 2022-09-24 DIAGNOSIS — Z882 Allergy status to sulfonamides status: Secondary | ICD-10-CM | POA: Diagnosis not present

## 2022-09-24 DIAGNOSIS — I110 Hypertensive heart disease with heart failure: Secondary | ICD-10-CM | POA: Diagnosis present

## 2022-09-24 DIAGNOSIS — F209 Schizophrenia, unspecified: Secondary | ICD-10-CM | POA: Diagnosis present

## 2022-09-24 DIAGNOSIS — G40909 Epilepsy, unspecified, not intractable, without status epilepticus: Secondary | ICD-10-CM | POA: Diagnosis present

## 2022-09-24 DIAGNOSIS — Z560 Unemployment, unspecified: Secondary | ICD-10-CM

## 2022-09-24 DIAGNOSIS — Z91030 Bee allergy status: Secondary | ICD-10-CM

## 2022-09-24 DIAGNOSIS — F1290 Cannabis use, unspecified, uncomplicated: Secondary | ICD-10-CM | POA: Insufficient documentation

## 2022-09-24 DIAGNOSIS — F4310 Post-traumatic stress disorder, unspecified: Secondary | ICD-10-CM | POA: Diagnosis present

## 2022-09-24 DIAGNOSIS — I2510 Atherosclerotic heart disease of native coronary artery without angina pectoris: Secondary | ICD-10-CM | POA: Diagnosis present

## 2022-09-24 DIAGNOSIS — Z6841 Body Mass Index (BMI) 40.0 and over, adult: Secondary | ICD-10-CM | POA: Diagnosis not present

## 2022-09-24 DIAGNOSIS — Z5941 Food insecurity: Secondary | ICD-10-CM | POA: Diagnosis not present

## 2022-09-24 DIAGNOSIS — Z7951 Long term (current) use of inhaled steroids: Secondary | ICD-10-CM

## 2022-09-24 DIAGNOSIS — F419 Anxiety disorder, unspecified: Secondary | ICD-10-CM | POA: Diagnosis present

## 2022-09-24 DIAGNOSIS — Z91410 Personal history of adult physical and sexual abuse: Secondary | ICD-10-CM

## 2022-09-24 DIAGNOSIS — R32 Unspecified urinary incontinence: Secondary | ICD-10-CM | POA: Diagnosis present

## 2022-09-24 DIAGNOSIS — R531 Weakness: Principal | ICD-10-CM

## 2022-09-24 DIAGNOSIS — Z9151 Personal history of suicidal behavior: Secondary | ICD-10-CM

## 2022-09-24 DIAGNOSIS — Z5900 Homelessness unspecified: Secondary | ICD-10-CM | POA: Insufficient documentation

## 2022-09-24 DIAGNOSIS — F332 Major depressive disorder, recurrent severe without psychotic features: Principal | ICD-10-CM | POA: Diagnosis present

## 2022-09-24 DIAGNOSIS — G4700 Insomnia, unspecified: Secondary | ICD-10-CM | POA: Diagnosis present

## 2022-09-24 DIAGNOSIS — I5032 Chronic diastolic (congestive) heart failure: Secondary | ICD-10-CM | POA: Insufficient documentation

## 2022-09-24 DIAGNOSIS — Z79899 Other long term (current) drug therapy: Secondary | ICD-10-CM

## 2022-09-24 DIAGNOSIS — Z955 Presence of coronary angioplasty implant and graft: Secondary | ICD-10-CM | POA: Diagnosis not present

## 2022-09-24 DIAGNOSIS — F603 Borderline personality disorder: Secondary | ICD-10-CM | POA: Diagnosis present

## 2022-09-24 DIAGNOSIS — Z1152 Encounter for screening for COVID-19: Secondary | ICD-10-CM | POA: Insufficient documentation

## 2022-09-24 DIAGNOSIS — Z86718 Personal history of other venous thrombosis and embolism: Secondary | ICD-10-CM

## 2022-09-24 DIAGNOSIS — F319 Bipolar disorder, unspecified: Secondary | ICD-10-CM | POA: Diagnosis not present

## 2022-09-24 DIAGNOSIS — G8929 Other chronic pain: Secondary | ICD-10-CM | POA: Insufficient documentation

## 2022-09-24 DIAGNOSIS — Z818 Family history of other mental and behavioral disorders: Secondary | ICD-10-CM

## 2022-09-24 DIAGNOSIS — F333 Major depressive disorder, recurrent, severe with psychotic symptoms: Secondary | ICD-10-CM

## 2022-09-24 DIAGNOSIS — R600 Localized edema: Secondary | ICD-10-CM | POA: Insufficient documentation

## 2022-09-24 DIAGNOSIS — Z886 Allergy status to analgesic agent status: Secondary | ICD-10-CM | POA: Diagnosis not present

## 2022-09-24 DIAGNOSIS — M79605 Pain in left leg: Secondary | ICD-10-CM | POA: Insufficient documentation

## 2022-09-24 DIAGNOSIS — Z91148 Patient's other noncompliance with medication regimen for other reason: Secondary | ICD-10-CM | POA: Insufficient documentation

## 2022-09-24 DIAGNOSIS — R45851 Suicidal ideations: Secondary | ICD-10-CM | POA: Diagnosis present

## 2022-09-24 DIAGNOSIS — M549 Dorsalgia, unspecified: Secondary | ICD-10-CM | POA: Insufficient documentation

## 2022-09-24 DIAGNOSIS — J45909 Unspecified asthma, uncomplicated: Secondary | ICD-10-CM | POA: Insufficient documentation

## 2022-09-24 DIAGNOSIS — I509 Heart failure, unspecified: Secondary | ICD-10-CM

## 2022-09-24 DIAGNOSIS — Z62810 Personal history of physical and sexual abuse in childhood: Secondary | ICD-10-CM | POA: Insufficient documentation

## 2022-09-24 LAB — CBC WITH DIFFERENTIAL/PLATELET
Abs Immature Granulocytes: 0.04 10*3/uL (ref 0.00–0.07)
Basophils Absolute: 0.1 10*3/uL (ref 0.0–0.1)
Basophils Relative: 1 %
Eosinophils Absolute: 0.2 10*3/uL (ref 0.0–0.5)
Eosinophils Relative: 3 %
HCT: 38.2 % (ref 36.0–46.0)
Hemoglobin: 12.5 g/dL (ref 12.0–15.0)
Immature Granulocytes: 1 %
Lymphocytes Relative: 23 %
Lymphs Abs: 1.6 10*3/uL (ref 0.7–4.0)
MCH: 29.9 pg (ref 26.0–34.0)
MCHC: 32.7 g/dL (ref 30.0–36.0)
MCV: 91.4 fL (ref 80.0–100.0)
Monocytes Absolute: 0.5 10*3/uL (ref 0.1–1.0)
Monocytes Relative: 8 %
Neutro Abs: 4.5 10*3/uL (ref 1.7–7.7)
Neutrophils Relative %: 64 %
Platelets: 269 10*3/uL (ref 150–400)
RBC: 4.18 MIL/uL (ref 3.87–5.11)
RDW: 13.2 % (ref 11.5–15.5)
WBC: 6.9 10*3/uL (ref 4.0–10.5)
nRBC: 0 % (ref 0.0–0.2)

## 2022-09-24 LAB — URINALYSIS, ROUTINE W REFLEX MICROSCOPIC
Bilirubin Urine: NEGATIVE
Glucose, UA: NEGATIVE mg/dL
Ketones, ur: NEGATIVE mg/dL
Leukocytes,Ua: NEGATIVE
Nitrite: NEGATIVE
Protein, ur: NEGATIVE mg/dL
Specific Gravity, Urine: 1.025 (ref 1.005–1.030)
pH: 5.5 (ref 5.0–8.0)

## 2022-09-24 LAB — POCT URINE DRUG SCREEN - MANUAL ENTRY (I-SCREEN)
POC Amphetamine UR: NOT DETECTED
POC Buprenorphine (BUP): NOT DETECTED
POC Cocaine UR: NOT DETECTED
POC Marijuana UR: POSITIVE — AB
POC Methadone UR: NOT DETECTED
POC Methamphetamine UR: NOT DETECTED
POC Morphine: NOT DETECTED
POC Oxazepam (BZO): NOT DETECTED
POC Oxycodone UR: NOT DETECTED
POC Secobarbital (BAR): NOT DETECTED

## 2022-09-24 LAB — COMPREHENSIVE METABOLIC PANEL
ALT: 17 U/L (ref 0–44)
AST: 17 U/L (ref 15–41)
Albumin: 3.3 g/dL — ABNORMAL LOW (ref 3.5–5.0)
Alkaline Phosphatase: 58 U/L (ref 38–126)
Anion gap: 8 (ref 5–15)
BUN: 19 mg/dL (ref 6–20)
CO2: 24 mmol/L (ref 22–32)
Calcium: 8.8 mg/dL — ABNORMAL LOW (ref 8.9–10.3)
Chloride: 107 mmol/L (ref 98–111)
Creatinine, Ser: 0.7 mg/dL (ref 0.44–1.00)
GFR, Estimated: >60 mL/min (ref >60)
Glucose, Bld: 105 mg/dL — ABNORMAL HIGH (ref 70–99)
Potassium: 3.7 mmol/L (ref 3.5–5.1)
Sodium: 139 mmol/L (ref 135–145)
Total Bilirubin: 0.7 mg/dL (ref 0.3–1.2)
Total Protein: 7.2 g/dL (ref 6.5–8.1)

## 2022-09-24 LAB — HEMOGLOBIN A1C
Hgb A1c MFr Bld: 5.5 % (ref 4.8–5.6)
Mean Plasma Glucose: 111.15 mg/dL

## 2022-09-24 LAB — URINALYSIS, MICROSCOPIC (REFLEX)

## 2022-09-24 LAB — POC URINE PREG, ED: Preg Test, Ur: NEGATIVE

## 2022-09-24 LAB — ETHANOL: Alcohol, Ethyl (B): <10 mg/dL (ref <10)

## 2022-09-24 LAB — SARS CORONAVIRUS 2 BY RT PCR: SARS Coronavirus 2 by RT PCR: NEGATIVE

## 2022-09-24 MED ORDER — DIPHENHYDRAMINE HCL 50 MG/ML IJ SOLN: 50.0000 mg | Freq: Three times a day (TID) | INTRAMUSCULAR | Status: DC | PRN

## 2022-09-24 MED ORDER — LAMOTRIGINE 25 MG PO TABS
25.0000 mg | ORAL_TABLET | Freq: Every day | ORAL | Status: DC
  Administered 2022-09-24: 25 mg via ORAL
  Filled 2022-09-24: qty 1

## 2022-09-24 MED ORDER — ACETAMINOPHEN 325 MG PO TABS
650.0000 mg | ORAL_TABLET | Freq: Four times a day (QID) | ORAL | Status: DC | PRN
  Administered 2022-09-24: 650 mg via ORAL
  Filled 2022-09-24: qty 2

## 2022-09-24 MED ORDER — LORAZEPAM 2 MG/ML IJ SOLN: 2.0000 mg | Freq: Three times a day (TID) | INTRAMUSCULAR | Status: DC | PRN

## 2022-09-24 MED ORDER — ALUM & MAG HYDROXIDE-SIMETH 200-200-20 MG/5ML PO SUSP: 30.0000 mL | ORAL | Status: DC | PRN

## 2022-09-24 MED ORDER — HALOPERIDOL LACTATE 5 MG/ML IJ SOLN: 5.0000 mg | Freq: Three times a day (TID) | INTRAMUSCULAR | Status: DC | PRN

## 2022-09-24 MED ORDER — DIPHENHYDRAMINE HCL 25 MG PO CAPS: 50.0000 mg | ORAL_CAPSULE | Freq: Three times a day (TID) | ORAL | Status: DC | PRN

## 2022-09-24 MED ORDER — MAGNESIUM HYDROXIDE 400 MG/5ML PO SUSP: 30.0000 mL | Freq: Every day | ORAL | Status: DC | PRN

## 2022-09-24 MED ORDER — LORAZEPAM 2 MG PO TABS
2.0000 mg | ORAL_TABLET | Freq: Three times a day (TID) | ORAL | Status: DC | PRN
  Administered 2022-09-24: 2 mg via ORAL
  Filled 2022-09-24: qty 1

## 2022-09-24 MED ORDER — GABAPENTIN 300 MG PO CAPS
600.0000 mg | ORAL_CAPSULE | Freq: Three times a day (TID) | ORAL | Status: DC
  Administered 2022-09-24 (×2): 600 mg via ORAL
  Filled 2022-09-24 (×2): qty 2

## 2022-09-24 MED ORDER — HALOPERIDOL 5 MG PO TABS
5.0000 mg | ORAL_TABLET | Freq: Three times a day (TID) | ORAL | Status: DC | PRN
  Administered 2022-09-24: 5 mg via ORAL
  Filled 2022-09-24: qty 1

## 2022-09-24 MED ORDER — ACETAMINOPHEN 325 MG PO TABS
650.0000 mg | ORAL_TABLET | Freq: Four times a day (QID) | ORAL | Status: DC | PRN
  Administered 2022-09-24 – 2022-10-01 (×10): 650 mg via ORAL
  Filled 2022-09-24 (×10): qty 2

## 2022-09-24 MED ORDER — HYDROXYZINE HCL 25 MG PO TABS: 25.0000 mg | ORAL_TABLET | Freq: Three times a day (TID) | ORAL | Status: DC | PRN

## 2022-09-24 MED ORDER — TRAZODONE HCL 50 MG PO TABS: 50.0000 mg | ORAL_TABLET | Freq: Every evening | ORAL | Status: DC | PRN

## 2022-09-24 MED ORDER — ALBUTEROL SULFATE HFA 108 (90 BASE) MCG/ACT IN AERS: 2.0000 | INHALATION_SPRAY | Freq: Four times a day (QID) | RESPIRATORY_TRACT | Status: DC | PRN
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
## 2022-09-24 NOTE — ED Notes (Signed)
Patient recently experienced an accident of urinary incontinence when waking. She was unable to control outcome. Patient assisted to shower with use of walker to  shower-chair for self ADL performance. Bedding changed. Patient is A&OX4. Denies S/I, H/I, AVH. Patient appeared to experience some embarrassment with incident, reassured that it was not of her control and no judgement was made.
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
## 2022-09-24 NOTE — Tx Team (Signed)
Initial Treatment Plan 09/24/2022 10:53 PM [name] [name] Favor WUJ:[id number]    PATIENT STRESSORS: Educational concerns   Medication change or noncompliance     PATIENT STRENGTHS: Ability for insight  Supportive family/friends    PATIENT IDENTIFIED PROBLEMS: Acute Psychosis  Anxiety  Depression                 DISCHARGE CRITERIA:  Adequate post-discharge living arrangements Verbal commitment to aftercare and medication compliance  PRELIMINARY DISCHARGE PLAN: Outpatient therapy Return to previous living arrangement  PATIENT/FAMILY INVOLVEMENT: This treatment plan has been presented to and reviewed with the patient, [name] [name].  The patient has been given the opportunity to ask questions and make suggestions.  Elmyra Ricks, RN 09/24/2022, 10:53 PM

## 2022-09-24 NOTE — ED Notes (Signed)
Safe Transport requested. 

## 2022-09-24 NOTE — Progress Notes (Signed)
Pt was accepted to [facility] TODAY 09/24/2022, pending labs, EKG, and voluntary consent. Bed assignment: 301  Pt meets inpatient criteria per Karie Fetch, MD  Attending Physician will be Elane Fritz, DO  Report can be called to: [phone]  Pt can arrive after pending items are received  Care Team Notified: Rona Ravens, RN, Nelly Rout, MD, Karie Fetch, MD, Vernard Gambles, NP, and Horton Marshall, RN  Nada, LCSW  09/24/2022 12:52 PM

## 2022-09-24 NOTE — ED Notes (Signed)
Pt A&O x 4, presents with suicidal ideations, plan to jump off bridge. Patient also endorse AVH stating that she hears voices attacking her about her past and seeing people that are present.  Denies HI or AVH.  Pt calm & cooperative, monitoring for safety. Patient oriented to unit and meal given. Cane substituted with walker.  Safety maintain.

## 2022-09-24 NOTE — ED Notes (Signed)
Report called to RN Glen Raven, [facility], BMU.  Librarian, academic.

## 2022-09-24 NOTE — ED Notes (Signed)
Blood work obtained

## 2022-09-24 NOTE — ED Notes (Signed)
 [facility] called for specimen pickup

## 2022-09-24 NOTE — ED Notes (Signed)
Pt A&O x 4,  sleeping at present.  No distress noted.  Monitoring for safety.  Pending report to [facility] & transport.
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
## 2022-09-24 NOTE — Plan of Care (Signed)
 Pt new to the unit tonight, hasn't had time to progress  Problem: Education: Goal: Knowledge of General Education information will improve Description: Including pain rating scale, medication(s)/side effects and non-pharmacologic comfort measures Outcome: Not Progressing   Problem: Health Behavior/Discharge Planning: Goal: Ability to manage health-related needs will improve Outcome: Not Progressing   Problem: Clinical Measurements: Goal: Ability to maintain clinical measurements within normal limits will improve Outcome: Not Progressing Goal: Will remain free from infection Outcome: Not Progressing Goal: Diagnostic test results will improve Outcome: Not Progressing Goal: Respiratory complications will improve Outcome: Not Progressing Goal: Cardiovascular complication will be avoided Outcome: Not Progressing   Problem: Activity: Goal: Risk for activity intolerance will decrease Outcome: Not Progressing   Problem: Nutrition: Goal: Adequate nutrition will be maintained Outcome: Not Progressing   Problem: Coping: Goal: Level of anxiety will decrease Outcome: Not Progressing   Problem: Elimination: Goal: Will not experience complications related to bowel motility Outcome: Not Progressing Goal: Will not experience complications related to urinary retention Outcome: Not Progressing   Problem: Pain Managment: Goal: General experience of comfort will improve Outcome: Not Progressing   Problem: Safety: Goal: Ability to remain free from injury will improve Outcome: Not Progressing   Problem: Skin Integrity: Goal: Risk for impaired skin integrity will decrease Outcome: Not Progressing   Problem: Education: Goal: Knowledge of [place] [facility] Education information/materials will improve Outcome: Not Progressing Goal: Emotional status will improve Outcome: Not Progressing Goal: Mental status will improve Outcome: Not Progressing Goal: Verbalization of understanding  the information provided will improve Outcome: Not Progressing   Problem: Activity: Goal: Interest or engagement in activities will improve Outcome: Not Progressing Goal: Sleeping patterns will improve Outcome: Not Progressing   Problem: Coping: Goal: Ability to verbalize frustrations and anger appropriately will improve Outcome: Not Progressing Goal: Ability to demonstrate self-control will improve Outcome: Not Progressing   Problem: Health Behavior/Discharge Planning: Goal: Identification of resources available to assist in meeting health care needs will improve Outcome: Not Progressing Goal: Compliance with treatment plan for underlying cause of condition will improve Outcome: Not Progressing   Problem: Physical Regulation: Goal: Ability to maintain clinical measurements within normal limits will improve Outcome: Not Progressing   Problem: Safety: Goal: Periods of time without injury will increase Outcome: Not Progressing

## 2022-09-25 DIAGNOSIS — F332 Major depressive disorder, recurrent severe without psychotic features: Secondary | ICD-10-CM | POA: Diagnosis not present

## 2022-09-25 LAB — LAMOTRIGINE LEVEL: Lamotrigine Lvl: <1 ug/mL — ABNORMAL LOW (ref 2.0–20.0)

## 2022-09-25 MED ORDER — FLUOXETINE HCL 20 MG PO CAPS
40.0000 mg | ORAL_CAPSULE | Freq: Every day | ORAL | Status: DC
  Administered 2022-09-25 – 2022-10-01 (×7): 40 mg via ORAL
  Filled 2022-09-25 (×7): qty 2

## 2022-09-25 MED ORDER — DOCUSATE SODIUM 100 MG PO CAPS
100.0000 mg | ORAL_CAPSULE | Freq: Two times a day (BID) | ORAL | Status: DC
  Administered 2022-09-25 – 2022-10-01 (×6): 100 mg via ORAL
  Filled 2022-09-25 (×11): qty 1

## 2022-09-25 MED ORDER — GABAPENTIN 300 MG PO CAPS
300.0000 mg | ORAL_CAPSULE | Freq: Three times a day (TID) | ORAL | Status: DC
  Administered 2022-09-25 – 2022-09-26 (×4): 300 mg via ORAL
  Filled 2022-09-25 (×4): qty 1

## 2022-09-25 MED ORDER — PRAZOSIN HCL 2 MG PO CAPS
5.0000 mg | ORAL_CAPSULE | Freq: Every day | ORAL | Status: DC
  Administered 2022-09-25 – 2022-09-30 (×5): 5 mg via ORAL
  Filled 2022-09-25 (×5): qty 1

## 2022-09-25 MED ORDER — ALBUTEROL SULFATE HFA 108 (90 BASE) MCG/ACT IN AERS
2.0000 | INHALATION_SPRAY | RESPIRATORY_TRACT | Status: DC | PRN
  Administered 2022-09-26 – 2022-10-01 (×10): 2 via RESPIRATORY_TRACT
  Filled 2022-09-25: qty 6.7

## 2022-09-25 MED ORDER — PANTOPRAZOLE SODIUM 40 MG PO TBEC
40.0000 mg | DELAYED_RELEASE_TABLET | Freq: Every day | ORAL | Status: DC
  Administered 2022-09-25 – 2022-10-01 (×7): 40 mg via ORAL
  Filled 2022-09-25 (×7): qty 1

## 2022-09-25 MED ORDER — ARIPIPRAZOLE 10 MG PO TABS
10.0000 mg | ORAL_TABLET | Freq: Every day | ORAL | Status: DC
  Administered 2022-09-25 – 2022-09-27 (×3): 10 mg via ORAL
  Filled 2022-09-25 (×3): qty 1

## 2022-09-25 MED ORDER — LORAZEPAM 1 MG PO TABS: 1.0000 mg | ORAL_TABLET | ORAL | Status: DC | PRN

## 2022-09-25 MED ORDER — MOMETASONE FURO-FORMOTEROL FUM 100-5 MCG/ACT IN AERO
2.0000 | INHALATION_SPRAY | Freq: Two times a day (BID) | RESPIRATORY_TRACT | Status: DC
  Administered 2022-09-25 – 2022-10-01 (×13): 2 via RESPIRATORY_TRACT
  Filled 2022-09-25: qty 8.8

## 2022-09-25 MED ORDER — ATORVASTATIN CALCIUM 20 MG PO TABS
40.0000 mg | ORAL_TABLET | Freq: Every day | ORAL | Status: DC
  Administered 2022-09-25 – 2022-10-01 (×7): 40 mg via ORAL
  Filled 2022-09-25 (×8): qty 2

## 2022-09-25 MED ORDER — TOPIRAMATE 25 MG PO TABS
25.0000 mg | ORAL_TABLET | ORAL | Status: DC
  Administered 2022-09-25 – 2022-10-01 (×12): 25 mg via ORAL
  Filled 2022-09-25 (×12): qty 1

## 2022-09-25 MED ORDER — TRAZODONE HCL 100 MG PO TABS
100.0000 mg | ORAL_TABLET | Freq: Every evening | ORAL | Status: DC | PRN
  Administered 2022-09-26 (×2): 100 mg via ORAL
  Filled 2022-09-25: qty 1

## 2022-09-25 NOTE — Plan of Care (Signed)
  Problem: Education: Goal: Knowledge of General Education information will improve Description: Including pain rating scale, medication(s)/side effects and non-pharmacologic comfort measures Outcome: Not Progressing   Problem: Health Behavior/Discharge Planning: Goal: Ability to manage health-related needs will improve Outcome: Not Progressing   Problem: Clinical Measurements: Goal: Ability to maintain clinical measurements within normal limits will improve Outcome: Not Progressing Goal: Will remain free from infection Outcome: Not Progressing Goal: Diagnostic test results will improve Outcome: Not Progressing Goal: Respiratory complications will improve Outcome: Not Progressing Goal: Cardiovascular complication will be avoided Outcome: Not Progressing   Problem: Activity: Goal: Risk for activity intolerance will decrease Outcome: Not Progressing   Problem: Nutrition: Goal: Adequate nutrition will be maintained Outcome: Not Progressing   Problem: Coping: Goal: Level of anxiety will decrease Outcome: Not Progressing   Problem: Elimination: Goal: Will not experience complications related to bowel motility Outcome: Not Progressing Goal: Will not experience complications related to urinary retention Outcome: Not Progressing   Problem: Pain Managment: Goal: General experience of comfort will improve Outcome: Not Progressing   Problem: Safety: Goal: Ability to remain free from injury will improve Outcome: Not Progressing   Problem: Skin Integrity: Goal: Risk for impaired skin integrity will decrease Outcome: Not Progressing   Problem: Education: Goal: Knowledge of [place] General Education information/materials will improve Outcome: Not Progressing Goal: Emotional status will improve Outcome: Not Progressing Goal: Mental status will improve Outcome: Not Progressing Goal: Verbalization of understanding the information provided will improve Outcome: Not  Progressing   Problem: Activity: Goal: Interest or engagement in activities will improve Outcome: Not Progressing Goal: Sleeping patterns will improve Outcome: Not Progressing   Problem: Coping: Goal: Ability to verbalize frustrations and anger appropriately will improve Outcome: Not Progressing Goal: Ability to demonstrate self-control will improve Outcome: Not Progressing   Problem: Health Behavior/Discharge Planning: Goal: Identification of resources available to assist in meeting health care needs will improve Outcome: Not Progressing Goal: Compliance with treatment plan for underlying cause of condition will improve Outcome: Not Progressing   Problem: Physical Regulation: Goal: Ability to maintain clinical measurements within normal limits will improve Outcome: Not Progressing   Problem: Safety: Goal: Periods of time without injury will increase Outcome: Not Progressing   Problem: Activity: Goal: Will verbalize the importance of balancing activity with adequate rest periods Outcome: Not Progressing   Problem: Education: Goal: Will be free of psychotic symptoms Outcome: Not Progressing Goal: Knowledge of the prescribed therapeutic regimen will improve Outcome: Not Progressing   Problem: Coping: Goal: Coping ability will improve Outcome: Not Progressing Goal: Will verbalize feelings Outcome: Not Progressing   Problem: Health Behavior/Discharge Planning: Goal: Compliance with prescribed medication regimen will improve Outcome: Not Progressing   Problem: Nutritional: Goal: Ability to achieve adequate nutritional intake will improve Outcome: Not Progressing   Problem: Role Relationship: Goal: Ability to communicate needs accurately will improve Outcome: Not Progressing Goal: Ability to interact with others will improve Outcome: Not Progressing   Problem: Safety: Goal: Ability to redirect hostility and anger into socially appropriate behaviors will  improve Outcome: Not Progressing Goal: Ability to remain free from injury will improve Outcome: Not Progressing   Problem: Self-Care: Goal: Ability to participate in self-care as condition permits will improve Outcome: Not Progressing   Problem: Self-Concept: Goal: Will verbalize positive feelings about self Outcome: Not Progressing

## 2022-09-25 NOTE — Group Note (Signed)
Recreation Therapy Group Note   Group Topic:Relaxation  Group Date: 09/25/2022 Start Time: 1030 End Time: 1120 Facilitators: Rosina Lowenstein, LRT, CTRS Location:  [facility]  Group Description: PMR (Progressive Muscle Relaxation). LRT asks patients their current level of stress/anxiety from 1-10, with 10 being the highest. LRT educates patients on what PMR is and the benefits that come from it. Patients are asked to sit with their feet flat on the floor while sitting up and all the way back in their chair, if possible. LRT and pts follow a prompt through a speaker that requires you to tense and release different muscles in their body and focus on their breathing. During session, lights are off and soft music is being played. At the end of the prompt, LRT asks patients to rank their current levels of stress/anxiety from 1-10, 10 being the highest.   Goal Area(s) Addressed:  Patients will be able to describe progressive muscle relaxation.  Patient will practice using relaxation technique. Patient will identify a new coping skill.  Patient will follow multistep directions to reduce anxiety and stress.   Affect/Mood: N/A   Participation Level: Did not attend    Clinical Observations/Individualized Feedback: [name] [name] did not attend group.  Plan: Continue to engage patient in RT group sessions 2-3x/week.   Rosina Lowenstein, LRT, CTRS 09/25/2022 12:43 PM
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
## 2022-09-25 NOTE — BH IP Treatment Plan (Signed)
Interdisciplinary Treatment and Diagnostic Plan Update  09/25/2022 Time of Session: 9:30AM [name] [name] MRN: [id number]  Principal Diagnosis: MDD (major depressive disorder), recurrent severe, without psychosis (HCC)  Secondary Diagnoses: Principal Problem:   MDD (major depressive disorder), recurrent severe, without psychosis (HCC)   Current Medications:  Current Facility-Administered Medications  Medication Dose Route Frequency Provider Last Rate Last Admin   acetaminophen (TYLENOL) tablet 650 mg  650 mg Oral Q6H PRN Nelly Rout, MD   650 mg at 09/24/22 2240   alum & mag hydroxide-simeth (MAALOX/MYLANTA) 200-200-20 MG/5ML suspension 30 mL  30 mL Oral Q4H PRN Nelly Rout, MD       diphenhydrAMINE (BENADRYL) capsule 50 mg  50 mg Oral TID PRN Nelly Rout, MD       Or   diphenhydrAMINE (BENADRYL) injection 50 mg  50 mg Intramuscular TID PRN Nelly Rout, MD       haloperidol (HALDOL) tablet 5 mg  5 mg Oral TID PRN Nelly Rout, MD   5 mg at 09/24/22 2240   Or   haloperidol lactate (HALDOL) injection 5 mg  5 mg Intramuscular TID PRN Nelly Rout, MD       LORazepam (ATIVAN) tablet 2 mg  2 mg Oral TID PRN Nelly Rout, MD   2 mg at 09/24/22 2240   Or   LORazepam (ATIVAN) injection 2 mg  2 mg Intramuscular TID PRN Nelly Rout, MD       magnesium hydroxide (MILK OF MAGNESIA) suspension 30 mL  30 mL Oral Daily PRN Nelly Rout, MD       PTA Medications: Medications Prior to Admission  Medication Sig Dispense Refill Last Dose   albuterol (PROVENTIL HFA;VENTOLIN HFA) 108 (90 BASE) MCG/ACT inhaler Inhale 2 puffs into the lungs every 6 (six) hours as needed for wheezing or shortness of breath (SOB).      ARIPiprazole (ABILIFY) 5 MG tablet Take 5 mg by mouth daily.      atorvastatin (LIPITOR) 40 MG tablet Take 1 tablet by mouth daily.      benztropine (COGENTIN) 0.5 MG tablet Take 1 tablet by mouth at bedtime.      diclofenac (VOLTAREN) 75 MG EC tablet Take 75 mg by  mouth 2 (two) times daily.      diphenhydrAMINE (BENADRYL) 25 MG tablet Take 25 mg by mouth every 6 (six) hours as needed for allergies.      docusate sodium (COLACE) 250 MG capsule Take 1 capsule (250 mg total) by mouth daily. 10 capsule 0    EPINEPHrine 0.3 mg/0.3 mL IJ SOAJ injection Inject 0.3 mg into the muscle as needed for anaphylaxis.      FLUoxetine (PROZAC) 40 MG capsule Take 40 mg by mouth 2 (two) times daily.      fluticasone-salmeterol (ADVAIR) 100-50 MCG/ACT AEPB Inhale 1 puff into the lungs 2 (two) times daily.      gabapentin (NEURONTIN) 300 MG capsule Take 2 capsules (600 mg total) by mouth 3 (three) times daily. 90 capsule 0    gabapentin (NEURONTIN) 600 MG tablet Take 600 mg by mouth 3 (three) times daily.      glucose blood (PRECISION QID TEST) test strip 1 each by Other route as needed for other.      hydrOXYzine (ATARAX) 25 MG tablet Take 25 mg by mouth 3 (three) times daily.      ibuprofen (ADVIL) 400 MG tablet Take 400 mg by mouth 3 (three) times daily.      lamoTRIgine (LAMICTAL) 25 MG tablet  Take 1 tablet (25 mg total) by mouth daily for 14 days, THEN 2 tablets (50 mg total) daily for 14 days. 42 tablet 0    meclizine (ANTIVERT) 12.5 MG tablet Take 1 tablet (12.5 mg total) by mouth 3 (three) times daily as needed for dizziness. (Patient not taking: Reported on 09/24/2022) 15 tablet 0    naproxen sodium (ANAPROX) 220 MG tablet Take 220 mg by mouth 2 (two) times daily as needed (for pain).      omeprazole (PRILOSEC) 40 MG capsule Take 40 mg by mouth daily.      ondansetron (ZOFRAN-ODT) 4 MG disintegrating tablet Take 1 tablet (4 mg total) by mouth every 8 (eight) hours as needed for nausea or vomiting. (Patient not taking: Reported on 09/24/2022) 20 tablet 0    prazosin (MINIPRESS) 5 MG capsule Take 5 mg by mouth at bedtime.      promethazine (PHENERGAN) 25 MG tablet Take 1 tablet (25 mg total) by mouth every 8 (eight) hours as needed for nausea or vomiting. 8 tablet 0     REXULTI 1 MG TABS tablet Take 1 mg by mouth daily.      SUMAtriptan (IMITREX) 100 MG tablet Take 100 mg by mouth as directed.      tiZANidine (ZANAFLEX) 4 MG tablet Take 4 mg by mouth 3 (three) times daily.       Patient Stressors: Educational concerns   Medication change or noncompliance    Patient Strengths: Ability for insight  Supportive family/friends   Treatment Modalities: Medication Management, Group therapy, Case management,  1 to 1 session with clinician, Psychoeducation, Recreational therapy.   Physician Treatment Plan for Primary Diagnosis: MDD (major depressive disorder), recurrent severe, without psychosis (HCC) Long Term Goal(s):     Short Term Goals:    Medication Management: Evaluate patient's response, side effects, and tolerance of medication regimen.  Therapeutic Interventions: 1 to 1 sessions, Unit Group sessions and Medication administration.  Evaluation of Outcomes: Not Met  Physician Treatment Plan for Secondary Diagnosis: Principal Problem:   MDD (major depressive disorder), recurrent severe, without psychosis (HCC)  Long Term Goal(s):     Short Term Goals:       Medication Management: Evaluate patient's response, side effects, and tolerance of medication regimen.  Therapeutic Interventions: 1 to 1 sessions, Unit Group sessions and Medication administration.  Evaluation of Outcomes: Not Met   RN Treatment Plan for Primary Diagnosis: MDD (major depressive disorder), recurrent severe, without psychosis (HCC) Long Term Goal(s): Knowledge of disease and therapeutic regimen to maintain health will improve  Short Term Goals: Ability to verbalize frustration and anger appropriately will improve, Ability to demonstrate self-control, Ability to participate in decision making will improve, Ability to verbalize feelings will improve, Ability to disclose and discuss suicidal ideas, Ability to identify and develop effective coping behaviors will improve, and  Compliance with prescribed medications will improve  Medication Management: RN will administer medications as ordered by provider, will assess and evaluate patient's response and provide education to patient for prescribed medication. RN will report any adverse and/or side effects to prescribing provider.  Therapeutic Interventions: 1 on 1 counseling sessions, Psychoeducation, Medication administration, Evaluate responses to treatment, Monitor vital signs and CBGs as ordered, Perform/monitor CIWA, COWS, AIMS and Fall Risk screenings as ordered, Perform wound care treatments as ordered.  Evaluation of Outcomes: Not Met   LCSW Treatment Plan for Primary Diagnosis: MDD (major depressive disorder), recurrent severe, without psychosis (HCC) Long Term Goal(s): Safe transition to appropriate next level  of care at discharge, Engage patient in therapeutic group addressing interpersonal concerns.  Short Term Goals: Engage patient in aftercare planning with referrals and resources, Increase social support, Increase ability to appropriately verbalize feelings, Increase emotional regulation, Facilitate acceptance of mental health diagnosis and concerns, Facilitate patient progression through stages of change regarding substance use diagnoses and concerns, Identify triggers associated with mental health/substance abuse issues, and Increase skills for wellness and recovery  Therapeutic Interventions: Assess for all discharge needs, 1 to 1 time with Social worker, Explore available resources and support systems, Assess for adequacy in community support network, Educate family and significant other(s) on suicide prevention, Complete Psychosocial Assessment, Interpersonal group therapy.  Evaluation of Outcomes: Not Met   Progress in Treatment: Attending groups: No. Participating in groups: No. Taking medication as prescribed: Yes. Toleration medication: Yes. Family/Significant other contact made: No, will  contact:  once permission has been given Patient understands diagnosis: Yes. Discussing patient identified problems/goals with staff: No. Medical problems stabilized or resolved: No. Denies suicidal/homicidal ideation: No. Issues/concerns per patient self-inventory: No. Other: none  New problem(s) identified: No, Describe:  none  New Short Term/Long Term Goal(s):  detox, elimination of symptoms of psychosis, medication management for mood stabilization; elimination of SI thoughts; development of comprehensive mental wellness/sobriety plan.   Patient Goals: Treatment team was attempted in patient's room.  Patient has mobility issues. Patient was asleep and did not awaken when name was called or shaken.  Patient appeared to be in no distress.    Discharge Plan or Barriers: CSW to assist in development of appropriate discharge plans.  Patient unable to participate in discussion at this time.   Reason for Continuation of Hospitalization: Anxiety Depression Medication stabilization Suicidal ideation  Estimated Length of Stay:  1-7 days  Last 3 [place] Suicide Severity Risk Score: Flowsheet Row Admission (Current) from 09/24/2022 in [facility] INPATIENT BEHAVIORAL MEDICINE Most recent reading at 09/24/2022 10:40 PM ED from 09/24/2022 in [facility] Most recent reading at 09/24/2022 12:53 PM ED from 09/05/2022 in [facility] Emergency Department at [facility] Most recent reading at 09/05/2022  1:09 PM  C-SSRS RISK CATEGORY No Risk High Risk No Risk       Last PHQ 2/9 Scores:     No data to display          Scribe for Treatment Team: Harden Mo, LCSW 09/25/2022 10:03 AM

## 2022-09-25 NOTE — Progress Notes (Signed)
D- Patient alert and oriented at present. Affect anxious/mood congruent. Verbalizes passive SI "crosses my mind" denies HI. States "the trolls move things around in my room, like that bag". Patient denies pain at present. Patient endorses depression and anxiety. Verbalizes concern about housing situation. She remained in the bed this morning and refused treatment team meeting. PT consult and she was able to get in shower independently. She I toilets herself independently and is continent of bowel and bladder. She ambulates slowly and carefully with walker on wheels. A- Scheduled medications administered to patient, per MD orders. Support and encouragement provided.  Routine safety checks conducted every 15 minutes without incident.  Patient informed to notify staff with problems or concerns and verbalizes understanding. R- No adverse drug reactions noted.  Patient compliant with medications and treatment plan. Patient receptive, calm cooperative and interacts well with others on the unit.  Patient contracts for safety and  remains safe on the unit at this time.
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
## 2022-09-25 NOTE — Group Note (Signed)
Date:  09/25/2022 Time:  10:53 AM  Group Topic/Focus:  Self Esteem Action Plan:   The focus of this group is to help patients create a plan to continue to build self-esteem after discharge.    Participation Level:  Did Not Attend   Rosaura Carpenter 09/25/2022, 10:53 AM

## 2022-09-25 NOTE — Group Note (Signed)
Date:  09/25/2022 Time:  9:24 PM  Group Topic/Focus:  Wellness Toolbox:   The focus of this group is to discuss various aspects of wellness, balancing those aspects and exploring ways to increase the ability to experience wellness.  Patients will create a wellness toolbox for use upon discharge.    Participation Level:  Active  Participation Quality:  Appropriate, Attentive, Sharing, and Supportive  Affect:  Appropriate  Cognitive:  Appropriate  Insight: Appropriate and Good  Engagement in Group:  Supportive  Modes of Intervention:  Discussion and Support  Additional Comments:     Belva Crome 09/25/2022, 9:24 PM

## 2022-09-25 NOTE — Discharge Instructions (Signed)
Accepted to [facility]
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
## 2022-09-25 NOTE — Group Note (Signed)
Date:  09/25/2022 Time:  12:15 AM  Group Topic/Focus:  Wrap-Up Group:   The focus of this group is to help patients review their daily goal of treatment and discuss progress on daily workbooks.    Participation Level:  Did Not Attend  Additional Comments:  pt was not yet on unit   Maglione,Jaonna Word E 09/25/2022, 12:15 AM

## 2022-09-25 NOTE — Group Note (Signed)
Date:  09/25/2022 Time:  5:16 PM  Group Topic/Focus:  Rediscovering Joy:   The focus of this group is to explore various ways to relieve stress in a positive manner.    Participation Level:  Active  Participation Quality:  Appropriate and Attentive  Affect:  Appropriate and Excited  Cognitive:  Alert, Appropriate, and Oriented  Insight: Appropriate  Engagement in Group:  Developing/Improving, Engaged, and Supportive  Modes of Intervention:  Activity, Discussion, Rapport Building, and Socialization  Additional Comments:    Rosaura Carpenter 09/25/2022, 5:16 PM

## 2022-09-25 NOTE — Plan of Care (Signed)
Problem: Education: Goal: Knowledge of General Education information will improve Description: Including pain rating scale, medication(s)/side effects and non-pharmacologic comfort measures 09/25/2022 1618 by Delos Haring, RN Outcome: Not Progressing 09/25/2022 1614 by Delos Haring, RN Outcome: Not Progressing   Problem: Health Behavior/Discharge Planning: Goal: Ability to manage health-related needs will improve 09/25/2022 1618 by Delos Haring, RN Outcome: Not Progressing 09/25/2022 1614 by Delos Haring, RN Outcome: Not Progressing   Problem: Clinical Measurements: Goal: Ability to maintain clinical measurements within normal limits will improve 09/25/2022 1618 by Delos Haring, RN Outcome: Not Progressing 09/25/2022 1614 by Delos Haring, RN Outcome: Not Progressing Goal: Will remain free from infection 09/25/2022 1618 by Delos Haring, RN Outcome: Not Progressing 09/25/2022 1614 by Delos Haring, RN Outcome: Not Progressing Goal: Diagnostic test results will improve 09/25/2022 1618 by Delos Haring, RN Outcome: Not Progressing 09/25/2022 1614 by Delos Haring, RN Outcome: Not Progressing Goal: Respiratory complications will improve 09/25/2022 1618 by Delos Haring, RN Outcome: Not Progressing 09/25/2022 1614 by Delos Haring, RN Outcome: Not Progressing Goal: Cardiovascular complication will be avoided 09/25/2022 1618 by Delos Haring, RN Outcome: Not Progressing 09/25/2022 1614 by Delos Haring, RN Outcome: Not Progressing   Problem: Activity: Goal: Risk for activity intolerance will decrease 09/25/2022 1618 by Delos Haring, RN Outcome: Not Progressing 09/25/2022 1614 by Delos Haring, RN Outcome: Not Progressing   Problem: Nutrition: Goal: Adequate nutrition will be maintained 09/25/2022 1618 by Delos Haring, RN Outcome: Not Progressing 09/25/2022 1614 by Delos Haring, RN Outcome: Not Progressing   Problem: Coping: Goal: Level  of anxiety will decrease 09/25/2022 1618 by Delos Haring, RN Outcome: Not Progressing 09/25/2022 1614 by Delos Haring, RN Outcome: Not Progressing   Problem: Elimination: Goal: Will not experience complications related to bowel motility 09/25/2022 1618 by Delos Haring, RN Outcome: Not Progressing 09/25/2022 1614 by Delos Haring, RN Outcome: Not Progressing Goal: Will not experience complications related to urinary retention 09/25/2022 1618 by Delos Haring, RN Outcome: Not Progressing 09/25/2022 1614 by Delos Haring, RN Outcome: Not Progressing   Problem: Pain Managment: Goal: General experience of comfort will improve 09/25/2022 1618 by Delos Haring, RN Outcome: Not Progressing 09/25/2022 1614 by Delos Haring, RN Outcome: Not Progressing   Problem: Safety: Goal: Ability to remain free from injury will improve 09/25/2022 1618 by Delos Haring, RN Outcome: Not Progressing 09/25/2022 1614 by Delos Haring, RN Outcome: Not Progressing   Problem: Skin Integrity: Goal: Risk for impaired skin integrity will decrease 09/25/2022 1618 by Delos Haring, RN Outcome: Not Progressing 09/25/2022 1614 by Delos Haring, RN Outcome: Not Progressing   Problem: Education: Goal: Knowledge of [place] [facility] Education information/materials will improve 09/25/2022 1618 by Delos Haring, RN Outcome: Not Progressing 09/25/2022 1614 by Delos Haring, RN Outcome: Not Progressing Goal: Emotional status will improve 09/25/2022 1618 by Delos Haring, RN Outcome: Not Progressing 09/25/2022 1614 by Delos Haring, RN Outcome: Not Progressing Goal: Mental status will improve 09/25/2022 1618 by Delos Haring, RN Outcome: Not Progressing 09/25/2022 1614 by Delos Haring, RN Outcome: Not Progressing Goal: Verbalization of understanding the information provided will improve 09/25/2022 1618 by Delos Haring, RN Outcome: Not Progressing 09/25/2022 1614 by Delos Haring, RN Outcome: Not Progressing   Problem: Activity: Goal: Interest or engagement in activities will improve 09/25/2022 1618 by Delos Haring, RN Outcome:  Not Progressing 09/25/2022 1614 by Delos Haring, RN Outcome: Not Progressing Goal: Sleeping patterns will improve 09/25/2022 1618 by Delos Haring, RN Outcome: Not Progressing 09/25/2022 1614 by Delos Haring, RN Outcome: Not Progressing   Problem: Coping: Goal: Ability to verbalize frustrations and anger appropriately will improve 09/25/2022 1618 by Delos Haring, RN Outcome: Not Progressing 09/25/2022 1614 by Delos Haring, RN Outcome: Not Progressing Goal: Ability to demonstrate self-control will improve 09/25/2022 1618 by Delos Haring, RN Outcome: Not Progressing 09/25/2022 1614 by Delos Haring, RN Outcome: Not Progressing   Problem: Health Behavior/Discharge Planning: Goal: Identification of resources available to assist in meeting health care needs will improve 09/25/2022 1618 by Delos Haring, RN Outcome: Not Progressing 09/25/2022 1614 by Delos Haring, RN Outcome: Not Progressing Goal: Compliance with treatment plan for underlying cause of condition will improve 09/25/2022 1618 by Delos Haring, RN Outcome: Not Progressing 09/25/2022 1614 by Delos Haring, RN Outcome: Not Progressing   Problem: Physical Regulation: Goal: Ability to maintain clinical measurements within normal limits will improve 09/25/2022 1618 by Delos Haring, RN Outcome: Not Progressing 09/25/2022 1614 by Delos Haring, RN Outcome: Not Progressing   Problem: Safety: Goal: Periods of time without injury will increase 09/25/2022 1618 by Delos Haring, RN Outcome: Not Progressing 09/25/2022 1614 by Delos Haring, RN Outcome: Not Progressing   Problem: Activity: Goal: Will verbalize the importance of balancing activity with adequate rest periods 09/25/2022 1618 by Delos Haring, RN Outcome: Not  Progressing 09/25/2022 1614 by Delos Haring, RN Outcome: Not Progressing   Problem: Education: Goal: Will be free of psychotic symptoms 09/25/2022 1618 by Delos Haring, RN Outcome: Not Progressing 09/25/2022 1614 by Delos Haring, RN Outcome: Not Progressing Goal: Knowledge of the prescribed therapeutic regimen will improve 09/25/2022 1618 by Delos Haring, RN Outcome: Not Progressing 09/25/2022 1614 by Delos Haring, RN Outcome: Not Progressing   Problem: Coping: Goal: Coping ability will improve 09/25/2022 1618 by Delos Haring, RN Outcome: Not Progressing 09/25/2022 1614 by Delos Haring, RN Outcome: Not Progressing Goal: Will verbalize feelings 09/25/2022 1618 by Delos Haring, RN Outcome: Not Progressing 09/25/2022 1614 by Delos Haring, RN Outcome: Not Progressing   Problem: Health Behavior/Discharge Planning: Goal: Compliance with prescribed medication regimen will improve 09/25/2022 1618 by Delos Haring, RN Outcome: Not Progressing 09/25/2022 1614 by Delos Haring, RN Outcome: Not Progressing   Problem: Nutritional: Goal: Ability to achieve adequate nutritional intake will improve 09/25/2022 1618 by Delos Haring, RN Outcome: Not Progressing 09/25/2022 1614 by Delos Haring, RN Outcome: Not Progressing   Problem: Role Relationship: Goal: Ability to communicate needs accurately will improve 09/25/2022 1618 by Delos Haring, RN Outcome: Not Progressing 09/25/2022 1614 by Delos Haring, RN Outcome: Not Progressing Goal: Ability to interact with others will improve 09/25/2022 1618 by Delos Haring, RN Outcome: Not Progressing 09/25/2022 1614 by Delos Haring, RN Outcome: Not Progressing   Problem: Safety: Goal: Ability to redirect hostility and anger into socially appropriate behaviors will improve 09/25/2022 1618 by Delos Haring, RN Outcome: Not Progressing 09/25/2022 1614 by Delos Haring, RN Outcome: Not Progressing Goal:  Ability to remain free from injury will improve 09/25/2022 1618 by Delos Haring, RN Outcome: Not Progressing 09/25/2022 1614 by Delos Haring, RN Outcome: Not Progressing   Problem: Self-Care: Goal: Ability to participate in self-care as condition permits will improve 09/25/2022 1618  by Delos Haring, RN Outcome: Not Progressing 09/25/2022 1614 by Delos Haring, RN Outcome: Not Progressing   Problem: Self-Concept: Goal: Will verbalize positive feelings about self 09/25/2022 1618 by Delos Haring, RN Outcome: Not Progressing 09/25/2022 1614 by Delos Haring, RN Outcome: Not Progressing

## 2022-09-26 ENCOUNTER — Ambulatory Visit: Payer: MEDICAID | Attending: Cardiology | Admitting: Cardiology

## 2022-09-26 DIAGNOSIS — F332 Major depressive disorder, recurrent severe without psychotic features: Secondary | ICD-10-CM | POA: Diagnosis not present

## 2022-09-26 LAB — GLUCOSE, CAPILLARY
Glucose-Capillary: 107 mg/dL — ABNORMAL HIGH (ref 70–99)
Glucose-Capillary: 98 mg/dL (ref 70–99)

## 2022-09-26 MED ORDER — HYDROXYZINE HCL 25 MG PO TABS
25.0000 mg | ORAL_TABLET | Freq: Three times a day (TID) | ORAL | Status: DC | PRN
  Administered 2022-09-26 – 2022-10-01 (×4): 25 mg via ORAL
  Filled 2022-09-26 (×5): qty 1

## 2022-09-26 MED ORDER — GABAPENTIN 300 MG PO CAPS
600.0000 mg | ORAL_CAPSULE | Freq: Three times a day (TID) | ORAL | Status: DC
  Administered 2022-09-26 – 2022-10-01 (×15): 600 mg via ORAL
  Filled 2022-09-26 (×17): qty 2

## 2022-09-26 NOTE — Group Note (Signed)
Date:  09/26/2022 Time:  10:18 AM  Group Topic/Focus:  Crisis Planning:   The purpose of this group is to help patients create a crisis plan for use upon discharge or in the future, as needed.    Participation Level:  Did Not Attend   [name] [name] [name] [name] 09/26/2022, 10:18 AM

## 2022-09-26 NOTE — Plan of Care (Signed)
  Problem: Safety: Goal: Ability to remain free from injury will improve Outcome: Progressing   Problem: Education: Goal: Mental status will improve Outcome: Progressing   Problem: Safety: Goal: Periods of time without injury will increase Outcome: Progressing

## 2022-09-26 NOTE — Progress Notes (Signed)
   09/26/22 0840  Psych Admission Type (Psych Patients Only)  Admission Status Voluntary  Psychosocial Assessment  Patient Complaints Anxiety;Confusion  Eye Contact Fair  Facial Expression Flat  Affect Anxious;Depressed  Speech Soft  Interaction Cautious;Demanding  Motor Activity Slow;Unsteady  Appearance/Hygiene Poor hygiene  Behavior Characteristics Anxious;Irritable  Mood Anxious;Irritable  Thought Process  Coherency Disorganized  Content Delusions  Delusions Paranoid  Perception Derealization;Hallucinations  Hallucination Auditory;Visual  Judgment Impaired  Confusion Mild  Danger to Self  Current suicidal ideation? Denies  Agreement Not to Harm Self Yes  Description of Agreement Verbal   Patient has received PRN inhaler for wheezing and shortness of breath with good results.  Has received hydroxyzine for anxiety with good results.

## 2022-09-26 NOTE — Progress Notes (Signed)
[facility] MD Progress Note  09/26/2022 1:16 PM [name] [name]  MRN:  [id number] Subjective: [name] [name] is seen on rounds.  She is originally from [place] and moved to [place] 12 years ago to get away from her surroundings and family.  She attends [place] [employer] [place] [employer] but states that she cannot understand the doctor.  I told her there is [facility] in [place] where she lives and she said she did not know that.  She says that her Neurontin is 600 mg and not 300.  She has applied for disability.  She says that she has suicidal thoughts all the time.  They never go away.  She is pleasant and cooperative and has no complaints.  Her speech is mildly pressured. Principal Problem: MDD (major depressive disorder), recurrent severe, without psychosis (HCC) Diagnosis: Principal Problem:   MDD (major depressive disorder), recurrent severe, without psychosis (HCC)  Total Time spent with patient: 15 minutes  Past Psychiatric History: PTSD, depression, cluster B traits, seizure disorder?  Bipolar disorder?  Past Medical History:  Past Medical History:  Diagnosis Date   Asthma    Bipolar 1 disorder (HCC)    Cancer (HCC)    CHF (congestive heart failure) (HCC)    Depression    Hypertension    Obesity    PTSD (post-traumatic stress disorder)    Schizophrenia (HCC)    Seizures (HCC)     Past Surgical History:  Procedure Laterality Date   CESAREAN SECTION     CHOLECYSTECTOMY     Family History: History reviewed. No pertinent family history. Family Psychiatric  History: Unremarkable Social History:  Social History   Substance and Sexual Activity  Alcohol Use Yes     Social History   Substance and Sexual Activity  Drug Use No    Social History   Socioeconomic History   Marital status: Single    Spouse name: Not on file   Number of children: Not on file   Years of education: Not on file   Highest education level: Not on file  Occupational History   Not on file  Tobacco Use   Smoking  status: Never   Smokeless tobacco: Never  Substance and Sexual Activity   Alcohol use: Yes   Drug use: No   Sexual activity: Not on file  Other Topics Concern   Not on file  Social History Narrative   Not on file   Social Determinants of Health   Financial Resource Strain: Not on file  Food Insecurity: No Food Insecurity (09/24/2022)   Hunger Vital Sign    Worried About Running Out of Food in the Last Year: Never true    Ran Out of Food in the Last Year: Never true  Recent Concern: Food Insecurity - Food Insecurity Present (09/24/2022)   Hunger Vital Sign    Worried About Running Out of Food in the Last Year: Often true    Ran Out of Food in the Last Year: Often true  Transportation Needs: No Transportation Needs (09/24/2022)   PRAPARE - Administrator, Civil Service (Medical): No    Lack of Transportation (Non-Medical): No  Physical Activity: Not on file  Stress: Not on file  Social Connections: Unknown (06/11/2021)   Received from [facility], [facility]   Social Network    Social Network: Not on file   Additional Social History:  Sleep: Good  Appetite:  Good  Current Medications: Current Facility-Administered Medications  Medication Dose Route Frequency Provider Last Rate Last Admin   acetaminophen (TYLENOL) tablet 650 mg  650 mg Oral Q6H PRN Nelly Rout, MD   650 mg at 09/26/22 1125   albuterol (VENTOLIN HFA) 108 (90 Base) MCG/ACT inhaler 2 puff  2 puff Inhalation Q4H PRN Sarina Ill, DO   2 puff at 09/26/22 0721   alum & mag hydroxide-simeth (MAALOX/MYLANTA) 200-200-20 MG/5ML suspension 30 mL  30 mL Oral Q4H PRN Nelly Rout, MD       ARIPiprazole (ABILIFY) tablet 10 mg  10 mg Oral Daily Sarina Ill, DO   10 mg at 09/26/22 8119   atorvastatin (LIPITOR) tablet 40 mg  40 mg Oral Daily Sarina Ill, DO   40 mg at 09/26/22 1478   diphenhydrAMINE (BENADRYL) capsule 50 mg  50 mg Oral  TID PRN Nelly Rout, MD       Or   diphenhydrAMINE (BENADRYL) injection 50 mg  50 mg Intramuscular TID PRN Nelly Rout, MD       docusate sodium (COLACE) capsule 100 mg  100 mg Oral BID Sarina Ill, DO   100 mg at 09/26/22 2956   FLUoxetine (PROZAC) capsule 40 mg  40 mg Oral Daily Sarina Ill, DO   40 mg at 09/26/22 2130   gabapentin (NEURONTIN) capsule 600 mg  600 mg Oral TID Sarina Ill, DO       haloperidol (HALDOL) tablet 5 mg  5 mg Oral TID PRN Nelly Rout, MD   5 mg at 09/24/22 2240   Or   haloperidol lactate (HALDOL) injection 5 mg  5 mg Intramuscular TID PRN Nelly Rout, MD       hydrOXYzine (ATARAX) tablet 25 mg  25 mg Oral TID PRN Charm Rings, NP   25 mg at 09/26/22 1125   magnesium hydroxide (MILK OF MAGNESIA) suspension 30 mL  30 mL Oral Daily PRN Nelly Rout, MD       mometasone-formoterol [facility]) 100-5 MCG/ACT inhaler 2 puff  2 puff Inhalation BID Sarina Ill, DO   2 puff at 09/26/22 0832   pantoprazole (PROTONIX) EC tablet 40 mg  40 mg Oral Daily Sarina Ill, DO   40 mg at 09/26/22 8657   prazosin (MINIPRESS) capsule 5 mg  5 mg Oral QHS Sarina Ill, DO   5 mg at 09/25/22 2110   topiramate (TOPAMAX) tablet 25 mg  25 mg Oral [id number] Sarina Ill, DO   25 mg at 09/26/22 8469   traZODone (DESYREL) tablet 100 mg  100 mg Oral QHS PRN Sarina Ill, DO        Lab Results:  Results for orders placed or performed during the hospital encounter of 09/24/22 (from the past 48 hour(s))  Glucose, capillary     Status: Abnormal   Collection Time: 09/26/22  7:04 AM  Result Value Ref Range   Glucose-Capillary 107 (H) 70 - 99 mg/dL    Comment: Glucose reference range applies only to samples taken after fasting for at least 8 hours.  Glucose, capillary     Status: None   Collection Time: 09/26/22 11:28 AM  Result Value Ref Range   Glucose-Capillary 98 70 - 99 mg/dL    Comment:  Glucose reference range applies only to samples taken after fasting for at least 8 hours.    Blood Alcohol level:  Lab Results  Component Value  Date   ETH <10 09/24/2022   ETH 80 (H) 09/04/2022    Metabolic Disorder Labs: Lab Results  Component Value Date   HGBA1C 5.5 09/24/2022   MPG 111.15 09/24/2022   No results found for: "PROLACTIN" No results found for: "CHOL", "TRIG", "HDL", "CHOLHDL", "VLDL", "LDLCALC"  Physical Findings: AIMS:  , ,  ,  ,    CIWA:    COWS:     Musculoskeletal: Strength & Muscle Tone: within normal limits Gait & Station: normal Patient leans: N/A  Psychiatric Specialty Exam:  Presentation  General Appearance:  Appropriate for Environment  Eye Contact: Fair  Speech: Pressured  Speech Volume: Normal  Handedness: -- (not assessed)   Mood and Affect  Mood: Labile  Affect: Full Range   Thought Process  Thought Processes: Coherent  Descriptions of Associations:Circumstantial  Orientation:Partial  Thought Content:Rumination  History of Schizophrenia/Schizoaffective disorder:No  Duration of Psychotic Symptoms:No data recorded Hallucinations:No data recorded Ideas of Reference:None  Suicidal Thoughts:No data recorded Homicidal Thoughts:No data recorded  Sensorium  Memory: Remote Poor; Immediate Fair  Judgment: Impaired  Insight: Lacking   Executive Functions  Concentration: Fair  Attention Span: Fair  Recall: [employer] of Knowledge: Fair  Language: Fair   Psychomotor Activity  Psychomotor Activity:No data recorded  Assets  Assets: Communication Skills; Desire for Improvement; Resilience   Sleep  Sleep:No data recorded    Blood pressure 124/82, pulse 71, temperature 97.9 F (36.6 C), temperature source Oral, resp. rate 19, height 5\' 2"  (1.575 m), weight (!) 139.3 kg, SpO2 96%. Body mass index is 56.15 kg/m.   Treatment Plan Summary: Daily contact with patient to assess and evaluate  symptoms and progress in treatment, Medication management, and Plan increase Neurontin to 600 mg 3 times a day.  Minor Iden Tresea Mall, DO 09/26/2022, 1:16 PM

## 2022-09-26 NOTE — Group Note (Signed)
[facility] LCSW Group Therapy Note   Group Date: 09/26/2022 Start Time: 1330 End Time: 1445   Type of Therapy/Topic:  Group Therapy:  Emotion Regulation  Participation Level:  Did Not Attend   Mood:  Description of Group:    The purpose of this group is to assist patients in learning to regulate negative emotions and experience positive emotions. Patients will be guided to discuss ways in which they have been vulnerable to their negative emotions. These vulnerabilities will be juxtaposed with experiences of positive emotions or situations, and patients challenged to use positive emotions to combat negative ones. Special emphasis will be placed on coping with negative emotions in conflict situations, and patients will process healthy conflict resolution skills.  Therapeutic Goals: Patient will identify two positive emotions or experiences to reflect on in order to balance out negative emotions:  Patient will label two or more emotions that they find the most difficult to experience:  Patient will be able to demonstrate positive conflict resolution skills through discussion or role plays:   Summary of Patient Progress:   X    Therapeutic Modalities:   Cognitive Behavioral Therapy Feelings Identification Dialectical Behavioral Therapy   Harden Mo, LCSW

## 2022-09-26 NOTE — Progress Notes (Deleted)
Cardiology Office Note:    Date:  09/26/2022   ID:  [name] [name], DOB Mar 06, 1975, MRN [id number]  PCP:  Pcp, No  Cardiologist:  None  Electrophysiologist:  None   Referring MD: No ref. provider found   No chief complaint on file. ***  History of Present Illness:    [name] [name] is a 47 y.o. female with a hx of ***  Past Medical History:  Diagnosis Date   Asthma    Bipolar 1 disorder (HCC)    Cancer (HCC)    CHF (congestive heart failure) (HCC)    Depression    Hypertension    Obesity    PTSD (post-traumatic stress disorder)    Schizophrenia (HCC)    Seizures (HCC)     Past Surgical History:  Procedure Laterality Date   CESAREAN SECTION     CHOLECYSTECTOMY      Current Medications: No outpatient medications have been marked as taking for the 09/26/22 encounter (Appointment) with Little Ishikawa, MD.     Allergies:   Aspirin, Bee venom, Sulfa antibiotics, and Ultram [tramadol]   Social History   Socioeconomic History   Marital status: Single    Spouse name: Not on file   Number of children: Not on file   Years of education: Not on file   Highest education level: Not on file  Occupational History   Not on file  Tobacco Use   Smoking status: Never   Smokeless tobacco: Never  Substance and Sexual Activity   Alcohol use: Yes   Drug use: No   Sexual activity: Not on file  Other Topics Concern   Not on file  Social History Narrative   Not on file   Social Determinants of Health   Financial Resource Strain: Not on file  Food Insecurity: No Food Insecurity (09/24/2022)   Hunger Vital Sign    Worried About Running Out of Food in the Last Year: Never true    Ran Out of Food in the Last Year: Never true  Recent Concern: Food Insecurity - Food Insecurity Present (09/24/2022)   Hunger Vital Sign    Worried About Running Out of Food in the Last Year: Often true    Ran Out of Food in the Last Year: Often true  Transportation Needs: No  Transportation Needs (09/24/2022)   PRAPARE - Administrator, Civil Service (Medical): No    Lack of Transportation (Non-Medical): No  Physical Activity: Not on file  Stress: Not on file  Social Connections: Unknown (06/11/2021)   Received from [facility], [facility]   Social Network    Social Network: Not on file     Family History: The patient's ***family history is not on file.  ROS:   Please see the history of present illness.    *** All other systems reviewed and are negative.  EKGs/Labs/Other Studies Reviewed:    The following studies were reviewed today: ***  EKG:  EKG is *** ordered today.  The ekg ordered today demonstrates ***  Recent Labs: 09/04/2022: B Natriuretic Peptide 29.7 09/24/2022: ALT 17; BUN 19; Creatinine, Ser 0.70; Hemoglobin 12.5; Platelets 269; Potassium 3.7; Sodium 139  Recent Lipid Panel No results found for: "CHOL", "TRIG", "HDL", "CHOLHDL", "VLDL", "LDLCALC", "LDLDIRECT"  Physical Exam:    VS:  There were no vitals taken for this visit.    Wt Readings from Last 3 Encounters:  09/05/22 (!) 307 lb 8.7 oz (139.5 kg)  09/04/22 (!) 307 lb 8.7 oz (  139.5 kg)  08/07/22 (!) 305 lb (138.3 kg)     GEN: *** Well nourished, well developed in no acute distress HEENT: Normal NECK: No JVD; No carotid bruits LYMPHATICS: No lymphadenopathy CARDIAC: ***RRR, no murmurs, rubs, gallops RESPIRATORY:  Clear to auscultation without rales, wheezing or rhonchi  ABDOMEN: Soft, non-tender, non-distended MUSCULOSKELETAL:  No edema; No deformity  SKIN: Warm and dry NEUROLOGIC:  Alert and oriented x 3 PSYCHIATRIC:  Normal affect   ASSESSMENT:    No diagnosis found. PLAN:    In order of problems listed above:  ***   Medication Adjustments/Labs and Tests Ordered: Current medicines are reviewed at length with the patient today.  Concerns regarding medicines are outlined above.  No orders of the defined types were placed in this encounter.  No  orders of the defined types were placed in this encounter.   There are no Patient Instructions on file for this visit.   Signed, Little Ishikawa, MD  09/26/2022 11:15 AM    [facility]

## 2022-09-26 NOTE — Progress Notes (Signed)
Nursing Shift Note:  1900-0700  Attended Evening Group: Yes Medication Compliant:  Yes, with encouragement.   Behavior: anxious; cooperative Sleep Quality: good Significant Changes: none noted.   09/26/22 0400  Psych Admission Type (Psych Patients Only)  Admission Status Voluntary  Psychosocial Assessment  Patient Complaints Anxiety;Confusion  Eye Contact Fair  Facial Expression Flat  Affect Anxious  Speech Soft  Interaction Cautious  Motor Activity Slow;Unsteady  Appearance/Hygiene Poor hygiene  Behavior Characteristics Cooperative  Mood Anxious  Thought Process  Coherency Disorganized  Content Delusions  Delusions Paranoid  Perception UTA  Hallucination UTA  Judgment Impaired  Confusion Mild  Danger to Self  Current suicidal ideation? Denies  Danger to Others  Danger to Others None reported or observed

## 2022-09-26 NOTE — Group Note (Signed)
Recreation Therapy Group Note   Group Topic:Health and Wellness  Group Date: 09/26/2022 Start Time: 1000 End Time: 1050 Facilitators: Rosina Lowenstein, LRT, CTRS Location: Courtyard  Group Description: [employer]. LRT and patients played games of basketball, drew with chalk, and played UNO while outside in the courtyard while getting fresh air and sunlight. Music was being played in the background. LRT and peers conversed about different games they have played before. LRT encouraged pts to drink water after being outside, sweating and getting their heart rate up.  Goal Area(s) Addressed: Patient will build on frustration tolerance skills. Patients will partake in a competitive play game with peers. Patients will gain knowledge of new leisure interest/hobby.    Affect/Mood: N/A   Participation Level: Did not attend    Clinical Observations/Individualized Feedback: [name] [name] did not attend group.  Plan: Continue to engage patient in RT group sessions 2-3x/week.   Rosina Lowenstein, LRT, CTRS 09/26/2022 11:19 AM

## 2022-09-27 ENCOUNTER — Encounter: Payer: Self-pay | Admitting: Cardiology

## 2022-09-27 DIAGNOSIS — F332 Major depressive disorder, recurrent severe without psychotic features: Secondary | ICD-10-CM | POA: Diagnosis not present

## 2022-09-27 LAB — GLUCOSE, CAPILLARY
Glucose-Capillary: 82 mg/dL (ref 70–99)
Glucose-Capillary: 100 mg/dL — ABNORMAL HIGH (ref 70–99)
Glucose-Capillary: 90 mg/dL (ref 70–99)

## 2022-09-27 MED ORDER — ARIPIPRAZOLE 5 MG PO TABS
15.0000 mg | ORAL_TABLET | Freq: Every day | ORAL | Status: DC
  Administered 2022-09-28 – 2022-10-01 (×4): 15 mg via ORAL
  Filled 2022-09-27 (×6): qty 1

## 2022-09-27 MED ORDER — OXYBUTYNIN CHLORIDE ER 5 MG PO TB24
5.0000 mg | ORAL_TABLET | Freq: Every day | ORAL | Status: DC
  Administered 2022-09-27 – 2022-09-30 (×4): 5 mg via ORAL
  Filled 2022-09-27 (×5): qty 1

## 2022-09-27 NOTE — [facility] Suicide Risk Assessment (Signed)
[facility] INPATIENT:  Family/Significant Other Suicide Prevention Education  Suicide Prevention Education:  Patient Refusal for Family/Significant Other Suicide Prevention Education: The patient [name] [name] has refused to provide written consent for family/significant other to be provided Family/Significant Other Suicide Prevention Education during admission and/or prior to discharge.  Physician notified.  SPE completed with pt, as pt refused to consent to family contact. SPI pamphlet provided to pt and pt was encouraged to share information with support network, ask questions, and talk about any concerns relating to SPE. Pt denies access to guns/firearms and verbalized understanding of information provided. Mobile Crisis information also provided to pt.   Harden Mo 09/27/2022, 1:32 PM

## 2022-09-27 NOTE — Progress Notes (Signed)
Patient remains sad, depressed and attention seeking on shift. EKG was completed after the patient c/o chest pain and being SOB, vitals was with in normal range for the patient and EKG completed and she was in normal sinus. Patient reported she didn't believe she needed any medications at the time and wanted to hold off on her night medications. She seemed to rest well through out the night.

## 2022-09-27 NOTE — Group Note (Signed)
Date:  09/27/2022 Time:  10:30 AM  Group Topic/Focus:  Goals Group:   The focus of this group is to help patients establish daily goals to achieve during treatment and discuss how the patient can incorporate goal setting into their daily lives to aide in recovery. Rediscovering Joy:   The focus of this group is to explore various ways to relieve stress in a positive manner.    Participation Level:  Minimal  Participation Quality:  Drowsy  Affect:  Lethargic  Cognitive:  Appropriate  Insight: Appropriate  Engagement in Group:  Limited  Modes of Intervention:  Discussion, Education, Socialization, and Support  Additional Comments:    Wilford Corner 09/27/2022, 10:30 AM

## 2022-09-27 NOTE — Group Note (Signed)
Date:  09/27/2022 Time:  4:19 PM  Group Topic/Focus:  Making Healthy Choices:   The focus of this group is to help patients identify negative/unhealthy choices they were using prior to admission and identify positive/healthier coping strategies to replace them upon discharge.    Participation Level:  Did Not Attend   Lynelle Smoke [facility] 09/27/2022, 4:19 PM

## 2022-09-27 NOTE — Plan of Care (Signed)
  Problem: Pain Managment: Goal: General experience of comfort will improve Outcome: Progressing   Problem: Safety: Goal: Ability to remain free from injury will improve Outcome: Progressing   Problem: Skin Integrity: Goal: Risk for impaired skin integrity will decrease Outcome: Progressing   

## 2022-09-27 NOTE — Progress Notes (Signed)
[facility] MD Progress Note  09/27/2022 11:23 AM [name] [name]  MRN:  [id number] Subjective: [name] [name] is seen on rounds.  She is asleep in bed.  She had chest pain last night which turned out to be nothing.  She also complains of bladder incontinence and auditory hallucinations.  I told her we can go up on her Abilify.  She says that [facility] talked to her about long-acting injection.  I told her that is possible but in the meantime we will go up on her oral Abilify and add some Ditropan because she says she is wetting the bed at night.  She is able to contract for safety in the hospital.  She also complains of night terrors and nightmares but she is on Minipress 5 mg. Principal Problem: MDD (major depressive disorder), recurrent severe, without psychosis (HCC) Diagnosis: Principal Problem:   MDD (major depressive disorder), recurrent severe, without psychosis (HCC)  Total Time spent with patient: 15 minutes  Past Psychiatric History: Extensive history of PTSD, depression, cluster B traits and bipolar disorder  Past Medical History:  Past Medical History:  Diagnosis Date   Asthma    Bipolar 1 disorder (HCC)    Cancer (HCC)    CHF (congestive heart failure) (HCC)    Depression    Hypertension    Obesity    PTSD (post-traumatic stress disorder)    Schizophrenia (HCC)    Seizures (HCC)     Past Surgical History:  Procedure Laterality Date   CESAREAN SECTION     CHOLECYSTECTOMY     Family History: History reviewed. No pertinent family history. Family Psychiatric  History: Unremarkable Social History:  Social History   Substance and Sexual Activity  Alcohol Use Yes     Social History   Substance and Sexual Activity  Drug Use No    Social History   Socioeconomic History   Marital status: Single    Spouse name: Not on file   Number of children: Not on file   Years of education: Not on file   Highest education level: Not on file  Occupational History   Not on file  Tobacco Use    Smoking status: Never   Smokeless tobacco: Never  Substance and Sexual Activity   Alcohol use: Yes   Drug use: No   Sexual activity: Not on file  Other Topics Concern   Not on file  Social History Narrative   Not on file   Social Determinants of Health   Financial Resource Strain: Not on file  Food Insecurity: No Food Insecurity (09/24/2022)   Hunger Vital Sign    Worried About Running Out of Food in the Last Year: Never true    Ran Out of Food in the Last Year: Never true  Recent Concern: Food Insecurity - Food Insecurity Present (09/24/2022)   Hunger Vital Sign    Worried About Running Out of Food in the Last Year: Often true    Ran Out of Food in the Last Year: Often true  Transportation Needs: No Transportation Needs (09/24/2022)   PRAPARE - Administrator, Civil Service (Medical): No    Lack of Transportation (Non-Medical): No  Physical Activity: Not on file  Stress: Not on file  Social Connections: Unknown (06/11/2021)   Received from [facility], [facility]   Social Network    Social Network: Not on file   Additional Social History:  Sleep: Good  Appetite:  Good  Current Medications: Current Facility-Administered Medications  Medication Dose Route Frequency Provider Last Rate Last Admin   acetaminophen (TYLENOL) tablet 650 mg  650 mg Oral Q6H PRN Nelly Rout, MD   650 mg at 09/26/22 1125   albuterol (VENTOLIN HFA) 108 (90 Base) MCG/ACT inhaler 2 puff  2 puff Inhalation Q4H PRN Sarina Ill, DO   2 puff at 09/26/22 0721   alum & mag hydroxide-simeth (MAALOX/MYLANTA) 200-200-20 MG/5ML suspension 30 mL  30 mL Oral Q4H PRN Nelly Rout, MD       [START ON 09/28/2022] ARIPiprazole (ABILIFY) tablet 15 mg  15 mg Oral Daily Sarina Ill, DO       atorvastatin (LIPITOR) tablet 40 mg  40 mg Oral Daily Sarina Ill, DO   40 mg at 09/27/22 0981   diphenhydrAMINE (BENADRYL) capsule 50 mg  50  mg Oral TID PRN Nelly Rout, MD       Or   diphenhydrAMINE (BENADRYL) injection 50 mg  50 mg Intramuscular TID PRN Nelly Rout, MD       docusate sodium (COLACE) capsule 100 mg  100 mg Oral BID Sarina Ill, DO   100 mg at 09/27/22 0736   FLUoxetine (PROZAC) capsule 40 mg  40 mg Oral Daily Sarina Ill, DO   40 mg at 09/27/22 1914   gabapentin (NEURONTIN) capsule 600 mg  600 mg Oral TID Sarina Ill, DO   600 mg at 09/27/22 7829   haloperidol (HALDOL) tablet 5 mg  5 mg Oral TID PRN Nelly Rout, MD   5 mg at 09/24/22 2240   Or   haloperidol lactate (HALDOL) injection 5 mg  5 mg Intramuscular TID PRN Nelly Rout, MD       hydrOXYzine (ATARAX) tablet 25 mg  25 mg Oral TID PRN Charm Rings, NP   25 mg at 09/26/22 1125   magnesium hydroxide (MILK OF MAGNESIA) suspension 30 mL  30 mL Oral Daily PRN Nelly Rout, MD       mometasone-formoterol [facility]) 100-5 MCG/ACT inhaler 2 puff  2 puff Inhalation BID Sarina Ill, DO   2 puff at 09/27/22 5621   oxybutynin (DITROPAN-XL) 24 hr tablet 5 mg  5 mg Oral QPC supper Sarina Ill, DO       pantoprazole (PROTONIX) EC tablet 40 mg  40 mg Oral Daily Sarina Ill, DO   40 mg at 09/27/22 3086   prazosin (MINIPRESS) capsule 5 mg  5 mg Oral QHS Sarina Ill, DO   5 mg at 09/25/22 2110   topiramate (TOPAMAX) tablet 25 mg  25 mg Oral [id number] Sarina Ill, DO   25 mg at 09/27/22 5784   traZODone (DESYREL) tablet 100 mg  100 mg Oral QHS PRN Sarina Ill, DO        Lab Results:  Results for orders placed or performed during the hospital encounter of 09/24/22 (from the past 48 hour(s))  Glucose, capillary     Status: Abnormal   Collection Time: 09/26/22  7:04 AM  Result Value Ref Range   Glucose-Capillary 107 (H) 70 - 99 mg/dL    Comment: Glucose reference range applies only to samples taken after fasting for at least 8 hours.  Glucose, capillary      Status: None   Collection Time: 09/26/22 11:28 AM  Result Value Ref Range   Glucose-Capillary 98 70 - 99 mg/dL    Comment: Glucose  reference range applies only to samples taken after fasting for at least 8 hours.  Glucose, capillary     Status: None   Collection Time: 09/27/22  7:43 AM  Result Value Ref Range   Glucose-Capillary 82 70 - 99 mg/dL    Comment: Glucose reference range applies only to samples taken after fasting for at least 8 hours.    Blood Alcohol level:  Lab Results  Component Value Date   ETH <10 09/24/2022   ETH 80 (H) 09/04/2022    Metabolic Disorder Labs: Lab Results  Component Value Date   HGBA1C 5.5 09/24/2022   MPG 111.15 09/24/2022   No results found for: "PROLACTIN" No results found for: "CHOL", "TRIG", "HDL", "CHOLHDL", "VLDL", "LDLCALC"  Physical Findings: AIMS:  , ,  ,  ,    CIWA:    COWS:     Musculoskeletal: Strength & Muscle Tone: within normal limits Gait & Station: normal Patient leans: N/A  Psychiatric Specialty Exam:  Presentation  General Appearance:  Appropriate for Environment  Eye Contact: Fair  Speech: Pressured  Speech Volume: Normal  Handedness: -- (not assessed)   Mood and Affect  Mood: Labile  Affect: Full Range   Thought Process  Thought Processes: Coherent  Descriptions of Associations:Circumstantial  Orientation:Partial  Thought Content:Rumination  History of Schizophrenia/Schizoaffective disorder:No  Duration of Psychotic Symptoms:No data recorded Hallucinations:No data recorded Ideas of Reference:None  Suicidal Thoughts:No data recorded Homicidal Thoughts:No data recorded  Sensorium  Memory: Remote Poor; Immediate Fair  Judgment: Impaired  Insight: Lacking   Executive Functions  Concentration: Fair  Attention Span: Fair  Recall: [employer] of Knowledge: Fair  Language: Fair   Psychomotor Activity  Psychomotor Activity:No data recorded  Assets   Assets: Communication Skills; Desire for Improvement; Resilience   Sleep  Sleep:No data recorded    Blood pressure (!) 120/90, pulse 81, temperature 98.6 F (37 C), temperature source Oral, resp. rate 20, height 5\' 2"  (1.575 m), weight (!) 139.3 kg, SpO2 97%. Body mass index is 56.15 kg/m.   Treatment Plan Summary: Daily contact with patient to assess and evaluate symptoms and progress in treatment, Medication management, and Plan start Ditropan XL 5 mg after supper and increase Abilify to 15 mg/day.  Sarina Ill, DO 09/27/2022, 11:23 AM
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
## 2022-09-27 NOTE — Plan of Care (Signed)
  Problem: Education: Goal: Knowledge of General Education information will improve Description: Including pain rating scale, medication(s)/side effects and non-pharmacologic comfort measures Outcome: Not Progressing   Problem: Health Behavior/Discharge Planning: Goal: Ability to manage health-related needs will improve Outcome: Not Progressing   Problem: Clinical Measurements: Goal: Ability to maintain clinical measurements within normal limits will improve Outcome: Not Progressing Goal: Will remain free from infection Outcome: Not Progressing Goal: Diagnostic test results will improve Outcome: Not Progressing Goal: Respiratory complications will improve Outcome: Not Progressing Goal: Cardiovascular complication will be avoided Outcome: Not Progressing   Problem: Activity: Goal: Risk for activity intolerance will decrease Outcome: Not Progressing   Problem: Nutrition: Goal: Adequate nutrition will be maintained Outcome: Not Progressing   Problem: Coping: Goal: Level of anxiety will decrease Outcome: Not Progressing   Problem: Elimination: Goal: Will not experience complications related to bowel motility Outcome: Not Progressing Goal: Will not experience complications related to urinary retention Outcome: Not Progressing   Problem: Pain Managment: Goal: General experience of comfort will improve Outcome: Not Progressing   Problem: Safety: Goal: Ability to remain free from injury will improve Outcome: Not Progressing   Problem: Skin Integrity: Goal: Risk for impaired skin integrity will decrease Outcome: Not Progressing   Problem: Education: Goal: Knowledge of [place] General Education information/materials will improve Outcome: Not Progressing Goal: Emotional status will improve Outcome: Not Progressing Goal: Mental status will improve Outcome: Not Progressing Goal: Verbalization of understanding the information provided will improve Outcome: Not  Progressing   Problem: Activity: Goal: Interest or engagement in activities will improve Outcome: Not Progressing Goal: Sleeping patterns will improve Outcome: Not Progressing   Problem: Coping: Goal: Ability to verbalize frustrations and anger appropriately will improve Outcome: Not Progressing Goal: Ability to demonstrate self-control will improve Outcome: Not Progressing   Problem: Health Behavior/Discharge Planning: Goal: Identification of resources available to assist in meeting health care needs will improve Outcome: Not Progressing Goal: Compliance with treatment plan for underlying cause of condition will improve Outcome: Not Progressing   Problem: Physical Regulation: Goal: Ability to maintain clinical measurements within normal limits will improve Outcome: Not Progressing   Problem: Safety: Goal: Periods of time without injury will increase Outcome: Not Progressing   Problem: Activity: Goal: Will verbalize the importance of balancing activity with adequate rest periods Outcome: Not Progressing   Problem: Education: Goal: Will be free of psychotic symptoms Outcome: Not Progressing Goal: Knowledge of the prescribed therapeutic regimen will improve Outcome: Not Progressing   Problem: Coping: Goal: Coping ability will improve Outcome: Not Progressing Goal: Will verbalize feelings Outcome: Not Progressing   Problem: Health Behavior/Discharge Planning: Goal: Compliance with prescribed medication regimen will improve Outcome: Not Progressing   Problem: Nutritional: Goal: Ability to achieve adequate nutritional intake will improve Outcome: Not Progressing   Problem: Role Relationship: Goal: Ability to communicate needs accurately will improve Outcome: Not Progressing Goal: Ability to interact with others will improve Outcome: Not Progressing   Problem: Safety: Goal: Ability to redirect hostility and anger into socially appropriate behaviors will  improve Outcome: Not Progressing Goal: Ability to remain free from injury will improve Outcome: Not Progressing   Problem: Self-Care: Goal: Ability to participate in self-care as condition permits will improve Outcome: Not Progressing   Problem: Self-Concept: Goal: Will verbalize positive feelings about self Outcome: Not Progressing

## 2022-09-27 NOTE — Group Note (Signed)
Recreation Therapy Group Note   Group Topic:Leisure Education  Group Date: 09/27/2022 Start Time: 1010 End Time: 1110 Facilitators: Rosina Lowenstein, LRT, CTRS Location:  Dayroom  Group Description: Leisure. Patients were given the option to choose from singing karaoke, playing UNO, playing with play-doh, journaling or coloring mandalas. LRT and pts discussed the meaning of leisure, the importance of participating in leisure during their free time/when they're outside of the hospital, as well as how our leisure interests can also serve as coping skills.   Goal Area(s) Addressed:  Patient will identify a current leisure interest.  Patient will learn the definition of "leisure". Patient will practice making a positive decision. Patient will have the opportunity to try a new leisure activity. Patient will communicate with peers and LRT.    Affect/Mood: N/A   Participation Level: Did not attend    Clinical Observations/Individualized Feedback: [name] [name] did not attend group.  Plan: Continue to engage patient in RT group sessions 2-3x/week.   Rosina Lowenstein, LRT, CTRS 09/27/2022 12:09 PM

## 2022-09-28 DIAGNOSIS — F332 Major depressive disorder, recurrent severe without psychotic features: Secondary | ICD-10-CM | POA: Diagnosis not present

## 2022-09-28 MED ORDER — ZOLPIDEM TARTRATE 5 MG PO TABS
5.0000 mg | ORAL_TABLET | Freq: Every evening | ORAL | Status: DC | PRN
  Administered 2022-09-28 – 2022-09-30 (×3): 5 mg via ORAL
  Filled 2022-09-28 (×3): qty 1

## 2022-09-28 NOTE — Group Note (Signed)
Date:  09/28/2022 Time:  10:27 PM  Group Topic/Focus:  Rediscovering Joy:   The focus of this group is to explore various ways to relieve stress in a positive manner.    Participation Level:  Active  Participation Quality:  Appropriate and Attentive  Affect:  Anxious and Appropriate  Cognitive:  Alert and Appropriate  Insight: Appropriate and Limited  Engagement in Group:  Developing/Improving and Engaged  Modes of Intervention:  Discussion, Rapport Building, Dance movement psychotherapist, Socialization, and Support  Additional Comments:     [name] [name] 09/28/2022, 10:27 PM

## 2022-09-28 NOTE — Progress Notes (Signed)
[facility] MD Progress Note  09/28/2022 11:57 AM [name] [name]  MRN:  [id number] Subjective: [name] [name] is seen on rounds.  She is feeling better.  She states that she had trouble sleeping last night and does not do well with trazodone.  She asks if she can have Ambien I said that is fine.  She denies any suicidal ideation.  She is very cluster B.  Her speech is less pressured.  She been compliant with medications.  Nurses report no issues. Principal Problem: MDD (major depressive disorder), recurrent severe, without psychosis (HCC) Diagnosis: Principal Problem:   MDD (major depressive disorder), recurrent severe, without psychosis (HCC)  Total Time spent with patient: 15 minutes  Past Psychiatric History: Cluster B traits and major depressive disorder  Past Medical History:  Past Medical History:  Diagnosis Date   Asthma    Bipolar 1 disorder (HCC)    Cancer (HCC)    CHF (congestive heart failure) (HCC)    Depression    Hypertension    Obesity    PTSD (post-traumatic stress disorder)    Schizophrenia (HCC)    Seizures (HCC)     Past Surgical History:  Procedure Laterality Date   CESAREAN SECTION     CHOLECYSTECTOMY     Family History: History reviewed. No pertinent family history. Family Psychiatric  History: Unremarkable Social History:  Social History   Substance and Sexual Activity  Alcohol Use Yes     Social History   Substance and Sexual Activity  Drug Use No    Social History   Socioeconomic History   Marital status: Single    Spouse name: Not on file   Number of children: Not on file   Years of education: Not on file   Highest education level: Not on file  Occupational History   Not on file  Tobacco Use   Smoking status: Never   Smokeless tobacco: Never  Substance and Sexual Activity   Alcohol use: Yes   Drug use: No   Sexual activity: Not on file  Other Topics Concern   Not on file  Social History Narrative   Not on file   Social Determinants of  Health   Financial Resource Strain: Not on file  Food Insecurity: No Food Insecurity (09/24/2022)   Hunger Vital Sign    Worried About Running Out of Food in the Last Year: Never true    Ran Out of Food in the Last Year: Never true  Recent Concern: Food Insecurity - Food Insecurity Present (09/24/2022)   Hunger Vital Sign    Worried About Running Out of Food in the Last Year: Often true    Ran Out of Food in the Last Year: Often true  Transportation Needs: No Transportation Needs (09/24/2022)   PRAPARE - Administrator, Civil Service (Medical): No    Lack of Transportation (Non-Medical): No  Physical Activity: Not on file  Stress: Not on file  Social Connections: Unknown (06/11/2021)   Received from [facility]   Social Network    Social Network: Not on file   Additional Social History:                         Sleep: Poor  Appetite:  Good  Current Medications: Current Facility-Administered Medications  Medication Dose Route Frequency Provider Last Rate Last Admin   acetaminophen (TYLENOL) tablet 650 mg  650 mg Oral Q6H PRN Nelly Rout, MD   650 mg at  09/27/22 1220   albuterol (VENTOLIN HFA) 108 (90 Base) MCG/ACT inhaler 2 puff  2 puff Inhalation Q4H PRN Sarina Ill, DO   2 puff at 09/27/22 2116   alum & mag hydroxide-simeth (MAALOX/MYLANTA) 200-200-20 MG/5ML suspension 30 mL  30 mL Oral Q4H PRN Nelly Rout, MD       ARIPiprazole (ABILIFY) tablet 15 mg  15 mg Oral Daily Sarina Ill, DO   15 mg at 09/28/22 0827   atorvastatin (LIPITOR) tablet 40 mg  40 mg Oral Daily Sarina Ill, DO   40 mg at 09/28/22 0827   diphenhydrAMINE (BENADRYL) capsule 50 mg  50 mg Oral TID PRN Nelly Rout, MD       Or   diphenhydrAMINE (BENADRYL) injection 50 mg  50 mg Intramuscular TID PRN Nelly Rout, MD       docusate sodium (COLACE) capsule 100 mg  100 mg Oral BID Sarina Ill, DO   100 mg at 09/28/22 5732    FLUoxetine (PROZAC) capsule 40 mg  40 mg Oral Daily Sarina Ill, DO   40 mg at 09/28/22 0827   gabapentin (NEURONTIN) capsule 600 mg  600 mg Oral TID Sarina Ill, DO   600 mg at 09/28/22 0820   haloperidol (HALDOL) tablet 5 mg  5 mg Oral TID PRN Nelly Rout, MD   5 mg at 09/24/22 2240   Or   haloperidol lactate (HALDOL) injection 5 mg  5 mg Intramuscular TID PRN Nelly Rout, MD       hydrOXYzine (ATARAX) tablet 25 mg  25 mg Oral TID PRN Charm Rings, NP   25 mg at 09/27/22 1750   magnesium hydroxide (MILK OF MAGNESIA) suspension 30 mL  30 mL Oral Daily PRN Nelly Rout, MD       mometasone-formoterol [facility]) 100-5 MCG/ACT inhaler 2 puff  2 puff Inhalation BID Sarina Ill, DO   2 puff at 09/28/22 0820   oxybutynin (DITROPAN-XL) 24 hr tablet 5 mg  5 mg Oral QPC supper Sarina Ill, DO   5 mg at 09/27/22 1710   pantoprazole (PROTONIX) EC tablet 40 mg  40 mg Oral Daily Sarina Ill, DO   40 mg at 09/28/22 0827   prazosin (MINIPRESS) capsule 5 mg  5 mg Oral QHS Sarina Ill, DO   5 mg at 09/27/22 2116   topiramate (TOPAMAX) tablet 25 mg  25 mg Oral [id number] Sarina Ill, DO   25 mg at 09/28/22 2025   traZODone (DESYREL) tablet 100 mg  100 mg Oral QHS PRN Sarina Ill, DO        Lab Results:  Results for orders placed or performed during the hospital encounter of 09/24/22 (from the past 48 hour(s))  Glucose, capillary     Status: None   Collection Time: 09/27/22  7:43 AM  Result Value Ref Range   Glucose-Capillary 82 70 - 99 mg/dL    Comment: Glucose reference range applies only to samples taken after fasting for at least 8 hours.  Glucose, capillary     Status: Abnormal   Collection Time: 09/27/22 12:17 PM  Result Value Ref Range   Glucose-Capillary 100 (H) 70 - 99 mg/dL    Comment: Glucose reference range applies only to samples taken after fasting for at least 8 hours.  Glucose, capillary      Status: None   Collection Time: 09/27/22  4:05 PM  Result Value Ref Range   Glucose-Capillary 90  70 - 99 mg/dL    Comment: Glucose reference range applies only to samples taken after fasting for at least 8 hours.    Blood Alcohol level:  Lab Results  Component Value Date   ETH <10 09/24/2022   ETH 80 (H) 09/04/2022    Metabolic Disorder Labs: Lab Results  Component Value Date   HGBA1C 5.5 09/24/2022   MPG 111.15 09/24/2022   No results found for: "PROLACTIN" No results found for: "CHOL", "TRIG", "HDL", "CHOLHDL", "VLDL", "LDLCALC"  Physical Findings: AIMS:  , ,  ,  ,    CIWA:    COWS:     Musculoskeletal: Strength & Muscle Tone: within normal limits Gait & Station: normal Patient leans: N/A  Psychiatric Specialty Exam:  Presentation  General Appearance:  Appropriate for Environment  Eye Contact: Fair  Speech: Pressured  Speech Volume: Normal  Handedness: -- (not assessed)   Mood and Affect  Mood: Labile  Affect: Full Range   Thought Process  Thought Processes: Coherent  Descriptions of Associations:Circumstantial  Orientation:Partial  Thought Content:Rumination  History of Schizophrenia/Schizoaffective disorder:No  Duration of Psychotic Symptoms:No data recorded Hallucinations:No data recorded Ideas of Reference:None  Suicidal Thoughts:No data recorded Homicidal Thoughts:No data recorded  Sensorium  Memory: Remote Poor; Immediate Fair  Judgment: Impaired  Insight: Lacking   Executive Functions  Concentration: Fair  Attention Span: Fair  Recall: [employer] of Knowledge: Fair  Language: Fair   Psychomotor Activity  Psychomotor Activity:No data recorded  Assets  Assets: Communication Skills; Desire for Improvement; Resilience   Sleep  Sleep:No data recorded    Blood pressure (!) 161/92, pulse 73, temperature 98.6 F (37 C), temperature source Oral, resp. rate 20, height 5\' 2"  (1.575 m), weight (!)  139.3 kg, SpO2 98%. Body mass index is 56.15 kg/m.   Treatment Plan Summary: Daily contact with patient to assess and evaluate symptoms and progress in treatment, Medication management, and Plan discontinue trazodone and start Ambien as needed.  Sarina Ill, DO 09/28/2022, 11:57 AM

## 2022-09-28 NOTE — Progress Notes (Signed)
The patient remained visible and social in the milieu.  At times, interactions with peers were bizarre.  [name] [name] continues to struggle with heightened levels of anxiety.  She also endorsed significant symptoms of depression and hopelessness.   PRN inhaler utilized appropriately.  Ambien provided for sleep.

## 2022-09-28 NOTE — Group Note (Signed)
[facility] LCSW Group Therapy Note   Group Date: 09/28/2022 Start Time: 1300 End Time: 1340   Type of Therapy/Topic:  Group Therapy:  Balance in Life  Participation Level:  Did Not Attend   Description of Group:    This group will address the concept of balance and how it feels and looks when one is unbalanced. Patients will be encouraged to process areas in their lives that are out of balance, and identify reasons for remaining unbalanced. Facilitators will guide patients utilizing problem- solving interventions to address and correct the stressor making their life unbalanced. Understanding and applying boundaries will be explored and addressed for obtaining  and maintaining a balanced life. Patients will be encouraged to explore ways to assertively make their unbalanced needs known to significant others in their lives, using other group members and facilitator for support and feedback.  Therapeutic Goals: Patient will identify two or more emotions or situations they have that consume much of in their lives. Patient will identify signs/triggers that life has become out of balance:  Patient will identify two ways to set boundaries in order to achieve balance in their lives:  Patient will demonstrate ability to communicate their needs through discussion and/or role plays  Summary of Patient Progress: The patient did not attend group.      Marshell Levan, LCSW

## 2022-09-28 NOTE — Group Note (Signed)
Date:  09/28/2022 Time:  7:05 PM  Group Topic/Focus:  Activity Group:  The focus of this group is to promote activity to go outside to the courtyard and get some good exercise.     Participation Level:  Active  Participation Quality:  Appropriate  Affect:  Appropriate  Cognitive:  Appropriate  Insight: Appropriate  Engagement in Group:  Improving  Modes of Intervention:  Activity  Additional Comments:    Mary Sella Maycen Degregory 09/28/2022, 7:05 PM

## 2022-09-28 NOTE — Group Note (Signed)
Date:  09/28/2022 Time:  10:29 AM  Group Topic/Focus:  Activity Group:  The focus of this group is to encourage patients to go outside to the courtyard to get some fresh air and some exercise as well.    Participation Level:  Active  Participation Quality:  Appropriate  Affect:  Appropriate  Cognitive:  Appropriate  Insight: Appropriate  Engagement in Group:  Engaged  Modes of Intervention:  Activity  Additional Comments:    [name] 09/28/2022, 10:29 AM

## 2022-09-28 NOTE — Progress Notes (Signed)
The patient watched television and socialized.  She accepted her medications as ordered and was pleasant.  [name] [name] was unstable while ambulating and transferring.  Walker utilized.

## 2022-09-28 NOTE — Progress Notes (Signed)
Pt continues to endorse high anxiety and is cooperative with treatment.    At approximately 12:45 PM pt presented to this writer stating "help me, I am having pain in my left leg and it is not the right color and is warm.  I'm afraid I have another blood clot.  This Clinical research associate assessed both lower extremities and noted no significant redness or warmth in the area.  Attending MD Dr. Marlou Porch was notified and advised that he will evaluate and no new orders given at this time.  Pt spoke with a friend by phone and became more calm; pt was given a PRN for pain and returned to her room to rest. Pt continues to be incontinent of urine while sleeping.  Hygiene and protective products supplied.  Continued monitoring for safety.    09/28/22 1300  Psych Admission Type (Psych Patients Only)  Admission Status Voluntary  Psychosocial Assessment  Patient Complaints Anxiety  Eye Contact Fair  Facial Expression Animated  Affect Anxious  Speech Soft  Interaction Attention-seeking;Assertive  Motor Activity Unsteady;Slow  Appearance/Hygiene Unremarkable  Behavior Characteristics Cooperative  Mood Anxious  Thought Process  Coherency Disorganized  Content Delusions  Delusions Paranoid  Perception Hallucinations  Hallucination Auditory  Judgment Impaired  Confusion Mild  Danger to Self  Current suicidal ideation? Denies  Danger to Others  Danger to Others None reported or observed

## 2022-09-29 ENCOUNTER — Encounter: Payer: Self-pay | Admitting: Psychiatry

## 2022-09-29 DIAGNOSIS — F332 Major depressive disorder, recurrent severe without psychotic features: Secondary | ICD-10-CM | POA: Diagnosis not present

## 2022-09-29 LAB — LIPID PANEL
Cholesterol: 126 mg/dL (ref 0–200)
HDL: 55 mg/dL (ref >40)
LDL Cholesterol: 42 mg/dL (ref 0–99)
Total CHOL/HDL Ratio: 2.3 ratio
Triglycerides: 144 mg/dL (ref <150)
VLDL: 29 mg/dL (ref 0–40)

## 2022-09-29 LAB — TSH: TSH: 3.176 u[IU]/mL (ref 0.350–4.500)

## 2022-09-29 NOTE — Progress Notes (Signed)
D- Patient alert and oriented x 4. Affect bright/mood disorganized but  pleasant. She states she has "trolls that move things in her room" and are a "nuisance" but denies AVH. Denies SI/ HI.  Patient complains of lower extremity, especially left leg 10/10 but does not request PRN pain medication. Patient endorses depression and anxiety. States her goal today is "getting better". A- Scheduled medications administered to patient, per MD orders. Support and encouragement provided.  Routine safety checks conducted every 15 minutes without incident.  Patient informed to notify staff with problems or concerns and verbalizes understanding. R- No adverse drug reactions noted.  Patient compliant with medications and treatment plan. Patient receptive and cooperative. She  interacts well with others on the unit.  Patient contracts for safety and  remains safe on the unit at this time.

## 2022-09-29 NOTE — Group Note (Signed)
Date:  09/29/2022 Time:  9:07 PM  Group Topic/Focus:  Wrap-Up Group:   The focus of this group is to help patients review their daily goal of treatment and discuss progress on daily workbooks.    Participation Level:  Active  Participation Quality:  Appropriate, Attentive, Sharing, and Supportive  Affect:  Appropriate  Cognitive:  Appropriate  Insight: Appropriate and Good  Engagement in Group:  Engaged and Supportive  Modes of Intervention:  Discussion  Additional Comments:     Belva Crome 09/29/2022, 9:07 PM

## 2022-09-29 NOTE — Group Note (Signed)
Date:  09/29/2022 Time:  6:48 PM  Group Topic/Focus:  Healthy Communication:   The focus of this group is to discuss communication, barriers to communication, as well as healthy ways to communicate with others.    Participation Level:  Active  Participation Quality:  Appropriate, Attentive, Sharing, and Supportive  Affect:  Appropriate  Cognitive:  Alert and Appropriate  Insight: Appropriate and Improving  Engagement in Group:  Developing/Improving  Modes of Intervention:  Activity, Discussion, Rapport Building, and Role-play  Additional Comments:    Rosaura Carpenter 09/29/2022, 6:48 PM

## 2022-09-29 NOTE — Progress Notes (Signed)
Pt complains of leg pain 5/10. EKG ordered by NP. No CV complaints.

## 2022-09-29 NOTE — Progress Notes (Signed)
[facility] MD Progress Note  09/29/2022 11:57 AM [name] [name]  MRN:  [id number]  Subjective:   47 yo female presented with depression and anxiety, history of PTSD and substance abuse.  Today, she complains of left leg pain that she feels is like a DVT (no redness noted, very large legs that make it difficult to assess) she has experienced in the past.  Consult placed to be evaluated.  She is psychosomatic at time with a personality disorder of BPD noted, recently felt she was having a MI while inpatient which was cleared.  Now, her leg will be evaluated by a hospitalist.  She does stated that "I'm happy they found my clothes."  She requested her gabapentin be increased but she is already on 600 mg TID which is high for anxiety, encouraged her to use her PRN hydroxyzine. [name] [name] reported, "The Ambien Dr. Marlou Porch started did really good on it."  Her sleep was good along with her appetite.  Depression is "so-so" but "if they discharge me, I will be suicidal because my grandchild died" who was evidently drowned by the mother last year in [place].  Discussed the average stay is 3-7 days and she should be working with the Child psychotherapist for homeless resources or other things she may need since this a big issue for her.  Principal Problem: MDD (major depressive disorder), recurrent severe, without psychosis (HCC) Diagnosis: Principal Problem:   MDD (major depressive disorder), recurrent severe, without psychosis (HCC)  Total Time spent with patient: 30 minutes  Past Psychiatric History: bipolar disorder (no history of schizophrenia), PTSD  Past Medical History:  Past Medical History:  Diagnosis Date   Asthma    Bipolar 1 disorder (HCC)    Cancer (HCC)    CHF (congestive heart failure) (HCC)    Depression    Hypertension    Obesity    PTSD (post-traumatic stress disorder)    Schizophrenia (HCC)    Seizures (HCC)     Past Surgical History:  Procedure Laterality Date   CESAREAN SECTION     CHOLECYSTECTOMY      Family History: History reviewed. No pertinent family history. Family Psychiatric  History: none Social History:  Social History   Substance and Sexual Activity  Alcohol Use Yes     Social History   Substance and Sexual Activity  Drug Use No    Social History   Socioeconomic History   Marital status: Single    Spouse name: Not on file   Number of children: Not on file   Years of education: Not on file   Highest education level: Not on file  Occupational History   Not on file  Tobacco Use   Smoking status: Never   Smokeless tobacco: Never  Substance and Sexual Activity   Alcohol use: Yes   Drug use: No   Sexual activity: Not on file  Other Topics Concern   Not on file  Social History Narrative   Not on file   Social Determinants of Health   Financial Resource Strain: Not on file  Food Insecurity: No Food Insecurity (09/24/2022)   Hunger Vital Sign    Worried About Running Out of Food in the Last Year: Never true    Ran Out of Food in the Last Year: Never true  Recent Concern: Food Insecurity - Food Insecurity Present (09/24/2022)   Hunger Vital Sign    Worried About Running Out of Food in the Last Year: Often true    Ran Out of  Food in the Last Year: Often true  Transportation Needs: No Transportation Needs (09/24/2022)   PRAPARE - Administrator, Civil Service (Medical): No    Lack of Transportation (Non-Medical): No  Physical Activity: Not on file  Stress: Not on file  Social Connections: Unknown (06/11/2021)   Received from [facility]   Social Network    Social Network: Not on file   Additional Social History: homeless      Sleep: Good  Appetite:  Good  Current Medications: Current Facility-Administered Medications  Medication Dose Route Frequency Provider Last Rate Last Admin   acetaminophen (TYLENOL) tablet 650 mg  650 mg Oral Q6H PRN Nelly Rout, MD   650 mg at 09/29/22 0514   albuterol (VENTOLIN HFA) 108 (90  Base) MCG/ACT inhaler 2 puff  2 puff Inhalation Q4H PRN Sarina Ill, DO   2 puff at 09/29/22 0514   alum & mag hydroxide-simeth (MAALOX/MYLANTA) 200-200-20 MG/5ML suspension 30 mL  30 mL Oral Q4H PRN Nelly Rout, MD       ARIPiprazole (ABILIFY) tablet 15 mg  15 mg Oral Daily Sarina Ill, DO   15 mg at 09/29/22 0847   atorvastatin (LIPITOR) tablet 40 mg  40 mg Oral Daily Sarina Ill, DO   40 mg at 09/29/22 0844   diphenhydrAMINE (BENADRYL) capsule 50 mg  50 mg Oral TID PRN Nelly Rout, MD       Or   diphenhydrAMINE (BENADRYL) injection 50 mg  50 mg Intramuscular TID PRN Nelly Rout, MD       docusate sodium (COLACE) capsule 100 mg  100 mg Oral BID Sarina Ill, DO   100 mg at 09/28/22 2130   FLUoxetine (PROZAC) capsule 40 mg  40 mg Oral Daily Sarina Ill, DO   40 mg at 09/29/22 8657   gabapentin (NEURONTIN) capsule 600 mg  600 mg Oral TID Sarina Ill, DO   600 mg at 09/29/22 8469   haloperidol (HALDOL) tablet 5 mg  5 mg Oral TID PRN Nelly Rout, MD   5 mg at 09/24/22 2240   Or   haloperidol lactate (HALDOL) injection 5 mg  5 mg Intramuscular TID PRN Nelly Rout, MD       hydrOXYzine (ATARAX) tablet 25 mg  25 mg Oral TID PRN Charm Rings, NP   25 mg at 09/28/22 1214   magnesium hydroxide (MILK OF MAGNESIA) suspension 30 mL  30 mL Oral Daily PRN Nelly Rout, MD       mometasone-formoterol [facility]) 100-5 MCG/ACT inhaler 2 puff  2 puff Inhalation BID Sarina Ill, DO   2 puff at 09/29/22 0844   oxybutynin (DITROPAN-XL) 24 hr tablet 5 mg  5 mg Oral QPC supper Sarina Ill, DO   5 mg at 09/28/22 1700   pantoprazole (PROTONIX) EC tablet 40 mg  40 mg Oral Daily Sarina Ill, DO   40 mg at 09/29/22 0844   prazosin (MINIPRESS) capsule 5 mg  5 mg Oral QHS Sarina Ill, DO   5 mg at 09/28/22 2102   topiramate (TOPAMAX) tablet 25 mg  25 mg Oral [id number] Sarina Ill,  DO   25 mg at 09/29/22 0844   zolpidem (AMBIEN) tablet 5 mg  5 mg Oral QHS PRN Sarina Ill, DO   5 mg at 09/28/22 2102    Lab Results:  Results for orders placed or performed during the hospital encounter of 09/24/22 (from  the past 48 hour(s))  Glucose, capillary     Status: Abnormal   Collection Time: 09/27/22 12:17 PM  Result Value Ref Range   Glucose-Capillary 100 (H) 70 - 99 mg/dL    Comment: Glucose reference range applies only to samples taken after fasting for at least 8 hours.  Glucose, capillary     Status: None   Collection Time: 09/27/22  4:05 PM  Result Value Ref Range   Glucose-Capillary 90 70 - 99 mg/dL    Comment: Glucose reference range applies only to samples taken after fasting for at least 8 hours.    Blood Alcohol level:  Lab Results  Component Value Date   ETH <10 09/24/2022   ETH 80 (H) 09/04/2022    Metabolic Disorder Labs: Lab Results  Component Value Date   HGBA1C 5.5 09/24/2022   MPG 111.15 09/24/2022   No results found for: "PROLACTIN" No results found for: "CHOL", "TRIG", "HDL", "CHOLHDL", "VLDL", "LDLCALC"  Physical Findings: AIMS:  , ,  ,  ,    CIWA:    COWS:     Musculoskeletal: Strength & Muscle Tone: within normal limits Gait & Station:  good with a walker Patient leans: N/A  Psychiatric Specialty Exam: Physical Exam Vitals and nursing note reviewed.  Constitutional:      Appearance: Normal appearance.  HENT:     Head: Normocephalic.     Nose: Nose normal.  Pulmonary:     Effort: Pulmonary effort is normal.  Musculoskeletal:        General: Normal range of motion.     Cervical back: Normal range of motion.  Neurological:     General: No focal deficit present.     Mental Status: She is alert and oriented to person, place, and time.     Review of Systems  Musculoskeletal:        Left leg pain  Psychiatric/Behavioral:  Positive for depression. The patient is nervous/anxious.   All other systems reviewed and  are negative.   Blood pressure 128/83, pulse 72, temperature 97.7 F (36.5 C), resp. rate 20, height 5\' 2"  (1.575 m), weight (!) 139.3 kg, SpO2 97%.Body mass index is 56.15 kg/m.  General Appearance: Casual  Eye Contact:  Good  Speech:  Clear and Coherent  Volume:  Normal  Mood:  Anxious and Depressed  Affect:  Congruent  Thought Process:  Coherent  Orientation:  Full (Time, Place, and Person)  Thought Content:  WDL  Suicidal Thoughts:  No  Homicidal Thoughts:  No  Memory:  Immediate;   Good Recent;   Good Remote;   Good  Judgement:  Fair  Insight:  Fair  Psychomotor Activity:  Normal  Concentration:  Concentration: Good and Attention Span: Good  Recall:  Good  Fund of Knowledge:  Good  Language:  Good  Akathisia:  No  Handed:  Right  AIMS (if indicated):     Assets:  Leisure Time Resilience  ADL's:  Intact  Cognition:  WNL  Sleep:         Physical Exam: Physical Exam Vitals and nursing note reviewed.  Constitutional:      Appearance: Normal appearance.  HENT:     Head: Normocephalic.     Nose: Nose normal.  Pulmonary:     Effort: Pulmonary effort is normal.  Musculoskeletal:        General: Normal range of motion.     Cervical back: Normal range of motion.  Neurological:     General: No focal  deficit present.     Mental Status: She is alert and oriented to person, place, and time.    Review of Systems  Musculoskeletal:        Left leg pain  Psychiatric/Behavioral:  Positive for depression. The patient is nervous/anxious.   All other systems reviewed and are negative.  Blood pressure 128/83, pulse 72, temperature 97.7 F (36.5 C), resp. rate 20, height 5\' 2"  (1.575 m), weight (!) 139.3 kg, SpO2 97%. Body mass index is 56.15 kg/m.   Treatment Plan Summary: Daily contact with patient to assess and evaluate symptoms and progress in treatment, Medication management, and Plan : Bipolar affective disorder, depressed, moderate: Abilify 15 mg daily Prozac  40 mg daily A1C 5.5 TSH, lipid panel, and EKG ordered.  Anxiety: Gabapentin 600 mg TID Hydroxyzine 25 mg TID PRN  Insomnia: Ambien 5 mg daily at bedtime  Nightmares: Prazosin 5 mg daily at bedtime  Nanine Means, NP 09/29/2022, 11:57 AM

## 2022-09-29 NOTE — Group Note (Signed)
Date:  09/29/2022 Time:  12:10 PM  Group Topic/Focus:  Outdoor Recreation. Music Therapy. Structured Outdoor Activity.    Participation Level:  Active  Participation Quality:  Appropriate  Affect:  Appropriate  Cognitive:  Alert and Appropriate  Insight: Appropriate  Engagement in Group:  Developing/Improving  Modes of Intervention:  Activity, Discussion, and Socialization  Additional Comments:    [name] [name] 09/29/2022, 12:10 PM

## 2022-09-29 NOTE — Plan of Care (Signed)
  Problem: Education: Goal: Knowledge of General Education information will improve Description: Including pain rating scale, medication(s)/side effects and non-pharmacologic comfort measures Outcome: Progressing   Problem: Health Behavior/Discharge Planning: Goal: Ability to manage health-related needs will improve Outcome: Progressing   Problem: Clinical Measurements: Goal: Ability to maintain clinical measurements within normal limits will improve Outcome: Progressing Goal: Will remain free from infection Outcome: Progressing Goal: Diagnostic test results will improve Outcome: Progressing Goal: Respiratory complications will improve Outcome: Progressing Goal: Cardiovascular complication will be avoided Outcome: Progressing   Problem: Activity: Goal: Risk for activity intolerance will decrease Outcome: Progressing   Problem: Nutrition: Goal: Adequate nutrition will be maintained Outcome: Progressing   Problem: Coping: Goal: Level of anxiety will decrease Outcome: Progressing   Problem: Elimination: Goal: Will not experience complications related to bowel motility Outcome: Progressing Goal: Will not experience complications related to urinary retention Outcome: Progressing   Problem: Pain Managment: Goal: General experience of comfort will improve Outcome: Progressing   Problem: Safety: Goal: Ability to remain free from injury will improve Outcome: Progressing   Problem: Skin Integrity: Goal: Risk for impaired skin integrity will decrease Outcome: Progressing   Problem: Education: Goal: Knowledge of [place] [facility] Education information/materials will improve Outcome: Progressing Goal: Emotional status will improve Outcome: Progressing Goal: Mental status will improve Outcome: Progressing Goal: Verbalization of understanding the information provided will improve Outcome: Progressing   Problem: Activity: Goal: Interest or engagement in activities will  improve Outcome: Progressing Goal: Sleeping patterns will improve Outcome: Progressing   Problem: Coping: Goal: Ability to verbalize frustrations and anger appropriately will improve Outcome: Progressing Goal: Ability to demonstrate self-control will improve Outcome: Progressing   Problem: Health Behavior/Discharge Planning: Goal: Identification of resources available to assist in meeting health care needs will improve Outcome: Progressing Goal: Compliance with treatment plan for underlying cause of condition will improve Outcome: Progressing   Problem: Physical Regulation: Goal: Ability to maintain clinical measurements within normal limits will improve Outcome: Progressing   Problem: Safety: Goal: Periods of time without injury will increase Outcome: Progressing   Problem: Activity: Goal: Will verbalize the importance of balancing activity with adequate rest periods Outcome: Progressing   Problem: Education: Goal: Will be free of psychotic symptoms Outcome: Progressing Goal: Knowledge of the prescribed therapeutic regimen will improve Outcome: Progressing   Problem: Coping: Goal: Coping ability will improve Outcome: Progressing Goal: Will verbalize feelings Outcome: Progressing   Problem: Health Behavior/Discharge Planning: Goal: Compliance with prescribed medication regimen will improve Outcome: Progressing   Problem: Nutritional: Goal: Ability to achieve adequate nutritional intake will improve Outcome: Progressing   Problem: Role Relationship: Goal: Ability to communicate needs accurately will improve Outcome: Progressing Goal: Ability to interact with others will improve Outcome: Progressing   Problem: Safety: Goal: Ability to redirect hostility and anger into socially appropriate behaviors will improve Outcome: Progressing Goal: Ability to remain free from injury will improve Outcome: Progressing   Problem: Self-Care: Goal: Ability to participate  in self-care as condition permits will improve Outcome: Progressing   Problem: Self-Concept: Goal: Will verbalize positive feelings about self Outcome: Progressing   

## 2022-09-29 NOTE — Group Note (Signed)
Date:  09/29/2022 Time:  3:10 PM  Group Topic/Focus:  Arts and creative therapies are treatments which involve creative activities within therapy sessions. They use different art forms, such as drawing, music or dance. Reduce levels of stress and anxiety. Increase self-esteem. Aid with self-discovery by helping acknowledge feelings in the subconscious. Letting go of negative thoughts and feelings.    Participation Level:  Active  Participation Quality:  Appropriate, Attentive, Sharing, and Supportive  Affect:  Appropriate  Cognitive:  Alert, Appropriate, and Oriented  Insight: Appropriate and Improving  Engagement in Group:  Developing/Improving and Engaged  Modes of Intervention:  Activity, Discussion, Problem-solving, Socialization, and Support  Additional Comments:    Rosaura Carpenter 09/29/2022, 3:10 PM

## 2022-09-30 ENCOUNTER — Inpatient Hospital Stay: Payer: No Typology Code available for payment source

## 2022-09-30 DIAGNOSIS — J45909 Unspecified asthma, uncomplicated: Secondary | ICD-10-CM | POA: Insufficient documentation

## 2022-09-30 DIAGNOSIS — M79605 Pain in left leg: Secondary | ICD-10-CM | POA: Insufficient documentation

## 2022-09-30 DIAGNOSIS — F332 Major depressive disorder, recurrent severe without psychotic features: Secondary | ICD-10-CM | POA: Diagnosis not present

## 2022-09-30 DIAGNOSIS — I509 Heart failure, unspecified: Secondary | ICD-10-CM

## 2022-09-30 MED ORDER — ONDANSETRON HCL 4 MG PO TABS: 4.0000 mg | ORAL_TABLET | Freq: Four times a day (QID) | ORAL | Status: DC | PRN

## 2022-09-30 MED ORDER — ENOXAPARIN SODIUM 300 MG/3ML IJ SOLN
200.0000 mg | Freq: Once | INTRAMUSCULAR | Status: DC
  Filled 2022-09-30: qty 2

## 2022-09-30 MED ORDER — ENOXAPARIN SODIUM 300 MG/3ML IJ SOLN
1.5000 mg/kg | INTRAMUSCULAR | Status: DC
  Filled 2022-09-30: qty 2.1

## 2022-09-30 MED ORDER — IOHEXOL 350 MG/ML SOLN
75.0000 mL | Freq: Once | INTRAVENOUS | Status: AC | PRN
  Administered 2022-09-30: 75 mL via INTRAVENOUS

## 2022-09-30 NOTE — Progress Notes (Signed)
[facility] MD Progress Note  09/30/2022  [name] [name]  MRN:  [id number]  Subjective:   47 yo female presented with depression and anxiety, history of PTSD and substance abuse.    Case discussed in multidisciplinary meeting today, chart reviewed, patient seen during rounds today Patient continues to complain of left leg pain that she feels is like a DVT  she has experienced in the past.  Patient reports depressed mood.  She continues to have suicidal thoughts but mentioned" not as bad as it was".  Patient was provided with support.  She was encouraged to attend group and work on coping strategies and a safe discharge plan.  Patient reports her sleep has improved.  We discussed getting Doppler ultrasound on left leg.  Orders placed.  Hospitalist team paged.  Dr. Alvester Morin will come and assess patient for left leg pain and to rule out DVT.  Patient denies any intention to harm herself on the unit.  She denies any manic or psychotic symptoms.  Principal Problem: MDD (major depressive disorder), recurrent severe, without psychosis (HCC) Diagnosis: Principal Problem:   MDD (major depressive disorder), recurrent severe, without psychosis (HCC)   Past Psychiatric History: bipolar disorder (no history of schizophrenia), PTSD  Past Medical History:  Past Medical History:  Diagnosis Date   Asthma    Bipolar 1 disorder (HCC)    Cancer (HCC)    CHF (congestive heart failure) (HCC)    Depression    Hypertension    Obesity    PTSD (post-traumatic stress disorder)    Seizures (HCC)     Past Surgical History:  Procedure Laterality Date   CESAREAN SECTION     CHOLECYSTECTOMY     Family History: History reviewed. No pertinent family history. Family Psychiatric  History: none Social History:  Social History   Substance and Sexual Activity  Alcohol Use Yes     Social History   Substance and Sexual Activity  Drug Use No    Social History   Socioeconomic History   Marital status: Single     Spouse name: Not on file   Number of children: Not on file   Years of education: Not on file   Highest education level: Not on file  Occupational History   Not on file  Tobacco Use   Smoking status: Never   Smokeless tobacco: Never  Substance and Sexual Activity   Alcohol use: Yes   Drug use: No   Sexual activity: Not on file  Other Topics Concern   Not on file  Social History Narrative   Not on file   Social Determinants of Health   Financial Resource Strain: Not on file  Food Insecurity: No Food Insecurity (09/24/2022)   Hunger Vital Sign    Worried About Running Out of Food in the Last Year: Never true    Ran Out of Food in the Last Year: Never true  Recent Concern: Food Insecurity - Food Insecurity Present (09/24/2022)   Hunger Vital Sign    Worried About Running Out of Food in the Last Year: Often true    Ran Out of Food in the Last Year: Often true  Transportation Needs: No Transportation Needs (09/24/2022)   PRAPARE - Administrator, Civil Service (Medical): No    Lack of Transportation (Non-Medical): No  Physical Activity: Not on file  Stress: Not on file  Social Connections: Unknown (06/11/2021)   [facility]   Social Network    Social  Network: Not on file   Additional Social History: homeless      Sleep: Good  Appetite:  Good  Current Medications: Current Facility-Administered Medications  Medication Dose Route Frequency Provider Last Rate Last Admin   acetaminophen (TYLENOL) tablet 650 mg  650 mg Oral Q6H PRN Nelly Rout, MD   650 mg at 09/30/22 1232   albuterol (VENTOLIN HFA) 108 (90 Base) MCG/ACT inhaler 2 puff  2 puff Inhalation Q4H PRN Sarina Ill, DO   2 puff at 09/30/22 0845   alum & mag hydroxide-simeth (MAALOX/MYLANTA) 200-200-20 MG/5ML suspension 30 mL  30 mL Oral Q4H PRN Nelly Rout, MD       ARIPiprazole (ABILIFY) tablet 15 mg  15 mg Oral Daily Sarina Ill, DO   15 mg at  09/30/22 0844   atorvastatin (LIPITOR) tablet 40 mg  40 mg Oral Daily Sarina Ill, DO   40 mg at 09/30/22 0844   diphenhydrAMINE (BENADRYL) capsule 50 mg  50 mg Oral TID PRN Nelly Rout, MD       Or   diphenhydrAMINE (BENADRYL) injection 50 mg  50 mg Intramuscular TID PRN Nelly Rout, MD       docusate sodium (COLACE) capsule 100 mg  100 mg Oral BID Sarina Ill, DO   100 mg at 09/28/22 1191   FLUoxetine (PROZAC) capsule 40 mg  40 mg Oral Daily Sarina Ill, DO   40 mg at 09/30/22 4782   gabapentin (NEURONTIN) capsule 600 mg  600 mg Oral TID Sarina Ill, DO   600 mg at 09/30/22 1232   haloperidol (HALDOL) tablet 5 mg  5 mg Oral TID PRN Nelly Rout, MD   5 mg at 09/24/22 2240   Or   haloperidol lactate (HALDOL) injection 5 mg  5 mg Intramuscular TID PRN Nelly Rout, MD       hydrOXYzine (ATARAX) tablet 25 mg  25 mg Oral TID PRN Charm Rings, NP   25 mg at 09/28/22 1214   magnesium hydroxide (MILK OF MAGNESIA) suspension 30 mL  30 mL Oral Daily PRN Nelly Rout, MD       mometasone-formoterol [place]) 100-5 MCG/ACT inhaler 2 puff  2 puff Inhalation BID Sarina Ill, DO   2 puff at 09/30/22 0845   oxybutynin (DITROPAN-XL) 24 hr tablet 5 mg  5 mg Oral QPC supper Sarina Ill, DO   5 mg at 09/29/22 1801   pantoprazole (PROTONIX) EC tablet 40 mg  40 mg Oral Daily Sarina Ill, DO   40 mg at 09/30/22 0844   prazosin (MINIPRESS) capsule 5 mg  5 mg Oral QHS Sarina Ill, DO   5 mg at 09/29/22 2105   topiramate (TOPAMAX) tablet 25 mg  25 mg Oral BH-[id number] Sarina Ill, DO   25 mg at 09/30/22 0844   zolpidem (AMBIEN) tablet 5 mg  5 mg Oral QHS PRN Sarina Ill, DO   5 mg at 09/29/22 2106    Lab Results:  Results for orders placed or performed during the hospital encounter of 09/24/22 (from the past 48 hour(s))  TSH     Status: None   Collection Time: 09/29/22  1:00 PM  Result  Value Ref Range   TSH 3.176 0.350 - 4.500 uIU/mL    Comment: Performed by a 3rd Generation assay with a functional sensitivity of <=0.01 uIU/mL. Performed at [facility], [address] [place], [place] [address]   Lipid panel  Status: None   Collection Time: 09/29/22  1:00 PM  Result Value Ref Range   Cholesterol 126 0 - 200 mg/dL   Triglycerides 829 <562 mg/dL   HDL 55 >13 mg/dL   Total CHOL/HDL Ratio 2.3 RATIO   VLDL 29 0 - 40 mg/dL   LDL Cholesterol 42 0 - 99 mg/dL    Comment:        Total Cholesterol/HDL:CHD Risk Coronary Heart Disease Risk Table                     Men   Women  1/2 Average Risk   3.4   3.3  Average Risk       5.0   4.4  2 X Average Risk   9.6   7.1  3 X Average Risk  23.4   11.0        Use the calculated Patient Ratio above and the CHD Risk Table to determine the patient's CHD Risk.        ATP III CLASSIFICATION (LDL):  <100     mg/dL   Optimal  086-578  mg/dL   Near or Above                    Optimal  130-159  mg/dL   Borderline  469-629  mg/dL   High  >528     mg/dL   Very High Performed at [facility], [address] [place], [place] [address]     Blood Alcohol level:  Lab Results  Component Value Date   ETH <10 09/24/2022   ETH 80 (H) 09/04/2022    Metabolic Disorder Labs: Lab Results  Component Value Date   HGBA1C 5.5 09/24/2022   MPG 111.15 09/24/2022   No results found for: "PROLACTIN" Lab Results  Component Value Date   CHOL 126 09/29/2022   TRIG 144 09/29/2022   HDL 55 09/29/2022   CHOLHDL 2.3 09/29/2022   VLDL 29 09/29/2022   LDLCALC 42 09/29/2022    Physical Findings: AIMS:  , ,  ,  ,    CIWA:    COWS:     Musculoskeletal: Strength & Muscle Tone: within normal limits Gait & Station:  good with a walker Patient leans: N/A  Psychiatric Specialty Exam:     Blood pressure 126/65, pulse 78, temperature 97.7 F (36.5 C), temperature source Oral, resp. rate 18, height 5\' 2"  (1.575 m),  weight (!) 139.3 kg, SpO2 98%.Body mass index is 56.15 kg/m.  General Appearance: Casual  Eye Contact:  Good  Speech:  Clear and Coherent  Volume:  Normal  Mood:  "depressed"  Affect:  Congruent  Thought Process:  Coherent  Orientation:  Full (Time, Place, and Person)  Thought Content:  WDL  Suicidal Thoughts:  Yes, denies intentions or plans  Homicidal Thoughts:  No  Memory:  Immediate;   Good Recent;   Good Remote;   Good  Judgement:  Fair  Insight:  Fair  Psychomotor Activity:  Normal  Concentration:  Concentration: Good and Attention Span: Good  Recall:  Good  Fund of Knowledge:  Good  Language:  Good  Akathisia:  No  Handed:  Right  AIMS (if indicated):     Assets:  Leisure Time Resilience  ADL's:  Intact  Cognition:  WNL  Sleep:   Improved      Physical Exam:  Constitutional:      Appearance: Normal appearance.  HENT:     Head:  Normocephalic and atraumatic.  Eyes:     Pupils: Pupils are equal, round, and reactive to light.  Cardiovascular:     Rate and Rhythm: Normal rate and regular rhythm.  Pulmonary:     Effort: Pulmonary effort is normal.     Breath sounds: Normal breath sounds.  Abdominal:     General: Abdomen is flat.  Skin:    General: Skin is warm and dry.  Neurological:     General: No focal deficit present.   Review of Systems  Musculoskeletal:        Left leg pain  Psychiatric/Behavioral:  Positive for depression.   All other systems reviewed and are negative.  Blood pressure 126/65, pulse 78, temperature 97.7 F (36.5 C), temperature source Oral, resp. rate 18, height 5\' 2"  (1.575 m), weight (!) 139.3 kg, SpO2 98%. Body mass index is 56.15 kg/m.   Treatment Plan Summary: Daily contact with patient to assess and evaluate symptoms and progress in treatment, Medication management, and Plan : Bipolar affective disorder, depressed, moderate: Abilify 15 mg daily Prozac 40 mg daily A1C 5.5 TSH, 3.176 lipid panel,Total chol:126, LDL  42   Anxiety: Gabapentin 600 mg TID Hydroxyzine 25 mg TID PRN  Insomnia: Ambien 5 mg daily at bedtime  Nightmares: Prazosin 5 mg daily at bedtime  Doppler ultrasound on left leg.  Orders placed.  Hospitalist team paged.  Dr. Alvester Morin will come and assess patient for left leg pain and to rule out DVT  Lewanda Rife, MD

## 2022-09-30 NOTE — BH IP Treatment Plan (Signed)
Interdisciplinary Treatment and Diagnostic Plan Update  09/30/2022 Time of Session: 9:00 AM  [name] [name] MRN: [id number]  Principal Diagnosis: MDD (major depressive disorder), recurrent severe, without psychosis (HCC)  Secondary Diagnoses: Principal Problem:   MDD (major depressive disorder), recurrent severe, without psychosis (HCC)   Current Medications:  Current Facility-Administered Medications  Medication Dose Route Frequency Provider Last Rate Last Admin   acetaminophen (TYLENOL) tablet 650 mg  650 mg Oral Q6H PRN Nelly Rout, MD   650 mg at 09/29/22 1815   albuterol (VENTOLIN HFA) 108 (90 Base) MCG/ACT inhaler 2 puff  2 puff Inhalation Q4H PRN Sarina Ill, DO   2 puff at 09/30/22 0845   alum & mag hydroxide-simeth (MAALOX/MYLANTA) 200-200-20 MG/5ML suspension 30 mL  30 mL Oral Q4H PRN Nelly Rout, MD       ARIPiprazole (ABILIFY) tablet 15 mg  15 mg Oral Daily Sarina Ill, DO   15 mg at 09/30/22 0844   atorvastatin (LIPITOR) tablet 40 mg  40 mg Oral Daily Sarina Ill, DO   40 mg at 09/30/22 0844   diphenhydrAMINE (BENADRYL) capsule 50 mg  50 mg Oral TID PRN Nelly Rout, MD       Or   diphenhydrAMINE (BENADRYL) injection 50 mg  50 mg Intramuscular TID PRN Nelly Rout, MD       docusate sodium (COLACE) capsule 100 mg  100 mg Oral BID Sarina Ill, DO   100 mg at 09/28/22 4098   FLUoxetine (PROZAC) capsule 40 mg  40 mg Oral Daily Sarina Ill, DO   40 mg at 09/30/22 1191   gabapentin (NEURONTIN) capsule 600 mg  600 mg Oral TID Sarina Ill, DO   600 mg at 09/30/22 0844   haloperidol (HALDOL) tablet 5 mg  5 mg Oral TID PRN Nelly Rout, MD   5 mg at 09/24/22 2240   Or   haloperidol lactate (HALDOL) injection 5 mg  5 mg Intramuscular TID PRN Nelly Rout, MD       hydrOXYzine (ATARAX) tablet 25 mg  25 mg Oral TID PRN Charm Rings, NP   25 mg at 09/28/22 1214   magnesium hydroxide (MILK OF  MAGNESIA) suspension 30 mL  30 mL Oral Daily PRN Nelly Rout, MD       mometasone-formoterol [facility]) 100-5 MCG/ACT inhaler 2 puff  2 puff Inhalation BID Sarina Ill, DO   2 puff at 09/30/22 0845   oxybutynin (DITROPAN-XL) 24 hr tablet 5 mg  5 mg Oral QPC supper Sarina Ill, DO   5 mg at 09/29/22 1801   pantoprazole (PROTONIX) EC tablet 40 mg  40 mg Oral Daily Sarina Ill, DO   40 mg at 09/30/22 0844   prazosin (MINIPRESS) capsule 5 mg  5 mg Oral QHS Sarina Ill, DO   5 mg at 09/29/22 2105   topiramate (TOPAMAX) tablet 25 mg  25 mg Oral BH-q8a4p Sarina Ill, DO   25 mg at 09/30/22 0844   zolpidem (AMBIEN) tablet 5 mg  5 mg Oral QHS PRN Sarina Ill, DO   5 mg at 09/29/22 2106   PTA Medications: Medications Prior to Admission  Medication Sig Dispense Refill Last Dose   albuterol (PROVENTIL HFA;VENTOLIN HFA) 108 (90 BASE) MCG/ACT inhaler Inhale 2 puffs into the lungs every 6 (six) hours as needed for wheezing or shortness of breath (SOB).      ARIPiprazole (ABILIFY) 5 MG tablet Take 5 mg by mouth  daily.      atorvastatin (LIPITOR) 40 MG tablet Take 1 tablet by mouth daily.      benztropine (COGENTIN) 0.5 MG tablet Take 1 tablet by mouth at bedtime.      diclofenac (VOLTAREN) 75 MG EC tablet Take 75 mg by mouth 2 (two) times daily.      diphenhydrAMINE (BENADRYL) 25 MG tablet Take 25 mg by mouth every 6 (six) hours as needed for allergies.      docusate sodium (COLACE) 250 MG capsule Take 1 capsule (250 mg total) by mouth daily. 10 capsule 0    EPINEPHrine 0.3 mg/0.3 mL IJ SOAJ injection Inject 0.3 mg into the muscle as needed for anaphylaxis.      FLUoxetine (PROZAC) 40 MG capsule Take 40 mg by mouth 2 (two) times daily.      fluticasone-salmeterol (ADVAIR) 100-50 MCG/ACT AEPB Inhale 1 puff into the lungs 2 (two) times daily.      gabapentin (NEURONTIN) 300 MG capsule Take 2 capsules (600 mg total) by mouth 3 (three) times  daily. 90 capsule 0    gabapentin (NEURONTIN) 600 MG tablet Take 600 mg by mouth 3 (three) times daily.      glucose blood (PRECISION QID TEST) test strip 1 each by Other route as needed for other.      hydrOXYzine (ATARAX) 25 MG tablet Take 25 mg by mouth 3 (three) times daily.      ibuprofen (ADVIL) 400 MG tablet Take 400 mg by mouth 3 (three) times daily.      lamoTRIgine (LAMICTAL) 25 MG tablet Take 1 tablet (25 mg total) by mouth daily for 14 days, THEN 2 tablets (50 mg total) daily for 14 days. 42 tablet 0    meclizine (ANTIVERT) 12.5 MG tablet Take 1 tablet (12.5 mg total) by mouth 3 (three) times daily as needed for dizziness. (Patient not taking: Reported on 09/24/2022) 15 tablet 0    naproxen sodium (ANAPROX) 220 MG tablet Take 220 mg by mouth 2 (two) times daily as needed (for pain).      omeprazole (PRILOSEC) 40 MG capsule Take 40 mg by mouth daily.      ondansetron (ZOFRAN-ODT) 4 MG disintegrating tablet Take 1 tablet (4 mg total) by mouth every 8 (eight) hours as needed for nausea or vomiting. (Patient not taking: Reported on 09/24/2022) 20 tablet 0    prazosin (MINIPRESS) 5 MG capsule Take 5 mg by mouth at bedtime.      promethazine (PHENERGAN) 25 MG tablet Take 1 tablet (25 mg total) by mouth every 8 (eight) hours as needed for nausea or vomiting. 8 tablet 0    REXULTI 1 MG TABS tablet Take 1 mg by mouth daily.      SUMAtriptan (IMITREX) 100 MG tablet Take 100 mg by mouth as directed.      tiZANidine (ZANAFLEX) 4 MG tablet Take 4 mg by mouth 3 (three) times daily.       Patient Stressors: Educational concerns   Medication change or noncompliance    Patient Strengths: Ability for insight  Supportive family/friends   Treatment Modalities: Medication Management, Group therapy, Case management,  1 to 1 session with clinician, Psychoeducation, Recreational therapy.   Physician Treatment Plan for Primary Diagnosis: MDD (major depressive disorder), recurrent severe, without  psychosis (HCC) Long Term Goal(s): Improvement in symptoms so as ready for discharge   Short Term Goals: Ability to identify changes in lifestyle to reduce recurrence of condition will improve Ability to verbalize feelings will  improve Ability to disclose and discuss suicidal ideas Ability to demonstrate self-control will improve Ability to identify and develop effective coping behaviors will improve Ability to maintain clinical measurements within normal limits will improve Compliance with prescribed medications will improve Ability to identify triggers associated with substance abuse/mental health issues will improve  Medication Management: Evaluate patient's response, side effects, and tolerance of medication regimen.  Therapeutic Interventions: 1 to 1 sessions, Unit Group sessions and Medication administration.  Evaluation of Outcomes: Progressing  Physician Treatment Plan for Secondary Diagnosis: Principal Problem:   MDD (major depressive disorder), recurrent severe, without psychosis (HCC)  Long Term Goal(s): Improvement in symptoms so as ready for discharge   Short Term Goals: Ability to identify changes in lifestyle to reduce recurrence of condition will improve Ability to verbalize feelings will improve Ability to disclose and discuss suicidal ideas Ability to demonstrate self-control will improve Ability to identify and develop effective coping behaviors will improve Ability to maintain clinical measurements within normal limits will improve Compliance with prescribed medications will improve Ability to identify triggers associated with substance abuse/mental health issues will improve     Medication Management: Evaluate patient's response, side effects, and tolerance of medication regimen.  Therapeutic Interventions: 1 to 1 sessions, Unit Group sessions and Medication administration.  Evaluation of Outcomes: Progressing   RN Treatment Plan for Primary Diagnosis: MDD  (major depressive disorder), recurrent severe, without psychosis (HCC) Long Term Goal(s): Knowledge of disease and therapeutic regimen to maintain health will improve  Short Term Goals: Ability to remain free from injury will improve, Ability to verbalize frustration and anger appropriately will improve, Ability to demonstrate self-control, Ability to participate in decision making will improve, Ability to verbalize feelings will improve, Ability to disclose and discuss suicidal ideas, Ability to identify and develop effective coping behaviors will improve, and Compliance with prescribed medications will improve  Medication Management: RN will administer medications as ordered by provider, will assess and evaluate patient's response and provide education to patient for prescribed medication. RN will report any adverse and/or side effects to prescribing provider.  Therapeutic Interventions: 1 on 1 counseling sessions, Psychoeducation, Medication administration, Evaluate responses to treatment, Monitor vital signs and CBGs as ordered, Perform/monitor CIWA, COWS, AIMS and Fall Risk screenings as ordered, Perform wound care treatments as ordered.  Evaluation of Outcomes: Progressing   LCSW Treatment Plan for Primary Diagnosis: MDD (major depressive disorder), recurrent severe, without psychosis (HCC) Long Term Goal(s): Safe transition to appropriate next level of care at discharge, Engage patient in therapeutic group addressing interpersonal concerns.  Short Term Goals: Engage patient in aftercare planning with referrals and resources, Increase social support, Increase ability to appropriately verbalize feelings, Increase emotional regulation, Facilitate acceptance of mental health diagnosis and concerns, Facilitate patient progression through stages of change regarding substance use diagnoses and concerns, Identify triggers associated with mental health/substance abuse issues, and Increase skills for  wellness and recovery  Therapeutic Interventions: Assess for all discharge needs, 1 to 1 time with Social worker, Explore available resources and support systems, Assess for adequacy in community support network, Educate family and significant other(s) on suicide prevention, Complete Psychosocial Assessment, Interpersonal group therapy.  Evaluation of Outcomes: Progressing   Progress in Treatment: Attending groups: Yes. Participating in groups: Yes. Taking medication as prescribed: Yes. Toleration medication: Yes. Family/Significant other contact made: No, will contact:  CSW will contact if given permission  Patient understands diagnosis: Yes. Discussing patient identified problems/goals with staff: Yes. Medical problems stabilized or resolved: Yes. Denies suicidal/homicidal ideation:  Yes. Issues/concerns per patient self-inventory: No. Other: None   New problem(s) identified: No, Describe:  None identified   Update 09/30/22: No changes at this time   New Short Term/Long Term Goal(s): detox, elimination of symptoms of psychosis, medication management for mood stabilization; elimination of SI thoughts; development of comprehensive mental wellness/sobriety plan.  Update 09/30/22: No changes at this time   Patient Goals:  Treatment team was attempted in patient's room. Patient has mobility issues. Patient was asleep and did not awaken when name was called or shaken. Patient appeared to be in no distress. Update 09/30/22: Pt unable to identify treatment goals with social work. Pt endorses not wanting to be discharged.    Discharge Plan or Barriers: CSW will assist with appropriate discharge planning Update 09/30/22: No changes at this time   Reason for Continuation of Hospitalization: Depression Medication stabilization  Estimated Length of Stay: 1 to 7 days Update 09/30/22: No changes at this time   Last 3 [place] Suicide Severity Risk Score: Flowsheet Row Admission (Current) from 09/24/2022 in  [facility] INPATIENT BEHAVIORAL MEDICINE Most recent reading at 09/24/2022 10:40 PM ED from 09/24/2022 in [facility] Most recent reading at 09/24/2022 12:53 PM ED from 09/05/2022 in [facility] Emergency Department at [facility] Most recent reading at 09/05/2022  1:09 PM  C-SSRS RISK CATEGORY No Risk High Risk No Risk       Last PHQ 2/9 Scores:     No data to display          Scribe for Treatment Team: Elza Rafter, Theresia Majors 09/30/2022 11:56 AM

## 2022-09-30 NOTE — Progress Notes (Signed)
Patient ambulating early this shift with and without walker. Laughing, watching tv and socializing with peers. Reminders to ambulate with walker. Complains of pain. Prn given with some relief. Medications given as prescribed. Pt receptive and remains safe on unit with q 15 min checks.

## 2022-09-30 NOTE — Assessment & Plan Note (Addendum)
Worsening left lower extremity redness and swelling over the past 1 to 2 days with noted prior history of DVT in the past Positive mild popliteal tenderness though body habitus is a confounding issue to exam Will tentatively plan for venous ultrasound Noted mild shortness of breath-add on CTA to rule out PE Start [facility] as appropriate pending imaging DDx includes bakers cyst in setting of L calf pain

## 2022-09-30 NOTE — Group Note (Signed)
Date:  09/30/2022 Time:  3:42 PM  Group Topic/Focus:   Goals Group:   The focus of this group is to help patients establish daily goals to achieve during treatment and discuss how the patient can incorporate goal setting into their daily lives to aide in recovery.   Participation Level:  Active  Participation Quality:  Appropriate and Attentive  Affect:  Appropriate  Cognitive:  Alert and Appropriate  Insight: Appropriate  Engagement in Group:  Engaged  Modes of Intervention:  Activity, Discussion, and Education  Additional Comments:    [name] [name] 09/30/2022, 3:42 PM

## 2022-09-30 NOTE — Group Note (Signed)
Date:  09/30/2022 Time:  5:48 PM  Group Topic/Focus:  Activity Group:  The focus of this group is to encourage patients to go outside to the courtyard and get some fresh air and some exercise as well.    Participation Level:  Did Not Attend   [name] [name] [name] [name] 09/30/2022, 5:48 PM

## 2022-09-30 NOTE — Progress Notes (Signed)
D- Patient alert and oriented x 4. Affect animated/mood elated, manic, loud and attention seeking. Complains of left leg pain 9/10 and states she has a history of DVT. Tylenol 650 mg administered po with fair results 9/10. Ultrasound and CT ordered per Hospitalist. IV ordered and consult with IV team ordered. IV inserted and Ultrasound and CT obtained. IV kept intact until CT read by Radiologist. 17:00 CT Negative and [place] Negative. IV discontinued. and 1:1 discontinued. MD notified.She denies SI/ HI/ AVH. Patient endorses depression and anxiety. Her goal of the day is to "talk to someone".  A- Scheduled medications administered to patient, per MD orders. Support and encouragement provided.  Routine safety checks conducted every 15 minutes without incident.  Patient informed to notify staff with problems or concerns and verbalizes understanding. R- No adverse drug reactions noted.  Patient compliant with medications and treatment plan. Patient receptive, redirectable and cooperative. She interacts well with others on the unit. Patient contracts for safety and  remains safe on the unit at this time.

## 2022-09-30 NOTE — Consult Note (Addendum)
Initial Consultation Note   Patient: [name] [name] JXB:[id number] DOB: Jun 23, 1975 PCP: Pcp, No DOA: 09/24/2022 DOS: the patient was seen and examined on 09/30/2022 Primary service: Nelly Rout, MD  Referring physician: Marval Regal MD  Reason for consult: LLE pain   Assessment/Plan: Assessment and Plan: * MDD (major depressive disorder), recurrent severe, without psychosis (HCC) Management per primary team  CHF (congestive heart failure) (HCC) Patient reports remote history of heart failure in the past Appears fairly euvolemic in the setting of large body habitus Will monitor for now Imaging and volume status monitoring as clinically indicated.  Asthma, chronic Stable from a respiratory standpoint Continue home inhalers  Acute pain of left lower extremity Worsening left lower extremity redness and swelling over the past 1 to 2 days with noted prior history of DVT in the past Positive mild popliteal tenderness though body habitus is a confounding issue to exam Will tentatively plan for venous ultrasound Noted mild shortness of breath-add on CTA to rule out PE Start [facility] as appropriate pending imaging DDx includes bakers cyst in setting of L calf pain       Greater than 50% was spent in counseling and coordination of care with patient Total encounter time 50 minutes or more    TRH will continue to follow the patient.  HPI: [name] [name] is a 47 y.o. female with past medical history of obesity, bipolar disorder, CHF, depression, hypertension, seizure disorder currently on the psychiatric service for major depression.  We have been called to consult because of acute left lower extremity pain.  Patient reports worsening left lower extremity pain over the past 1 to 2 days.  Does report remote history of DVT back in 1999.  States she has had acute redness pain and swelling.  No fevers or chills.  Minimal chest pain.  Positive mild shortness of breath.  Non-smoker.  Patient does not  report any birth control use.  No hemiparesis or confusion.  Pain is progressively worsened over the past 24 hours.  No reported trauma.  No reported falls. Presented to the ER afebrile, hemodynamically stable.  CBC and CMP are pending.  Lower extremity ultrasound as well as CTA of the chest have been ordered.  Review of Systems: As mentioned in the history of present illness. All other systems reviewed and are negative. Past Medical History:  Diagnosis Date   Asthma    Bipolar 1 disorder (HCC)    Cancer (HCC)    CHF (congestive heart failure) (HCC)    Depression    Hypertension    Obesity    PTSD (post-traumatic stress disorder)    Seizures (HCC)    Past Surgical History:  Procedure Laterality Date   CESAREAN SECTION     CHOLECYSTECTOMY     Social History:  reports that she has never smoked. She has never used smokeless tobacco. She reports current alcohol use. She reports that she does not use drugs.  Allergies  Allergen Reactions   Aspirin Anaphylaxis and Rash   Bee Venom Anaphylaxis and Rash   Sulfa Antibiotics Anaphylaxis   Ultram [Tramadol] Other (See Comments)    Migraines    History reviewed. No pertinent family history.  Prior to Admission medications   Medication Sig Start Date End Date Taking? Authorizing Provider  albuterol (PROVENTIL HFA;VENTOLIN HFA) 108 (90 BASE) MCG/ACT inhaler Inhale 2 puffs into the lungs every 6 (six) hours as needed for wheezing or shortness of breath (SOB).    [provider]  ARIPiprazole (ABILIFY) 5 MG  tablet Take 5 mg by mouth daily.    [provider]  atorvastatin (LIPITOR) 40 MG tablet Take 1 tablet by mouth daily. 03/21/22 10/23/22  [provider]  benztropine (COGENTIN) 0.5 MG tablet Take 1 tablet by mouth at bedtime. 03/19/22   [provider]  diclofenac (VOLTAREN) 75 MG EC tablet Take 75 mg by mouth 2 (two) times daily. 06/18/22 06/18/23  [provider]  diphenhydrAMINE (BENADRYL) 25 MG  tablet Take 25 mg by mouth every 6 (six) hours as needed for allergies.    [provider]  docusate sodium (COLACE) 250 MG capsule Take 1 capsule (250 mg total) by mouth daily. 01/26/16   Long, Arlyss Repress, MD  EPINEPHrine 0.3 mg/0.3 mL IJ SOAJ injection Inject 0.3 mg into the muscle as needed for anaphylaxis. 08/19/18   [provider]  FLUoxetine (PROZAC) 40 MG capsule Take 40 mg by mouth 2 (two) times daily. 03/19/22   [provider]  fluticasone-salmeterol (ADVAIR) 100-50 MCG/ACT AEPB Inhale 1 puff into the lungs 2 (two) times daily. 08/23/22 08/23/23  [provider]  gabapentin (NEURONTIN) 300 MG capsule Take 2 capsules (600 mg total) by mouth 3 (three) times daily. 09/05/22   Laurence Spates, MD  gabapentin (NEURONTIN) 600 MG tablet Take 600 mg by mouth 3 (three) times daily. 06/18/22   [provider]  glucose blood (PRECISION QID TEST) test strip 1 each by Other route as needed for other. 03/21/22 03/21/23  [provider]  hydrOXYzine (ATARAX) 25 MG tablet Take 25 mg by mouth 3 (three) times daily.    [provider]  ibuprofen (ADVIL) 400 MG tablet Take 400 mg by mouth 3 (three) times daily.    [provider]  lamoTRIgine (LAMICTAL) 25 MG tablet Take 1 tablet (25 mg total) by mouth daily for 14 days, THEN 2 tablets (50 mg total) daily for 14 days. 09/05/22 10/03/22  Laurence Spates, MD  meclizine (ANTIVERT) 12.5 MG tablet Take 1 tablet (12.5 mg total) by mouth 3 (three) times daily as needed for dizziness. Patient not taking: Reported on 09/24/2022 08/07/22   Gareth Eagle, PA-C  naproxen sodium (ANAPROX) 220 MG tablet Take 220 mg by mouth 2 (two) times daily as needed (for pain).    [provider]  omeprazole (PRILOSEC) 40 MG capsule Take 40 mg by mouth daily.    [provider]  ondansetron (ZOFRAN-ODT) 4 MG disintegrating tablet Take 1 tablet (4 mg total) by mouth every 8 (eight) hours as needed for nausea  or vomiting. Patient not taking: Reported on 09/24/2022 06/20/22   Teressa Lower, PA-C  prazosin (MINIPRESS) 5 MG capsule Take 5 mg by mouth at bedtime.    [provider]  promethazine (PHENERGAN) 25 MG tablet Take 1 tablet (25 mg total) by mouth every 8 (eight) hours as needed for nausea or vomiting. 06/20/22   Mesner, Barbara Cower, MD  REXULTI 1 MG TABS tablet Take 1 mg by mouth daily.    [provider]  SUMAtriptan (IMITREX) 100 MG tablet Take 100 mg by mouth as directed.    [provider]  tiZANidine (ZANAFLEX) 4 MG tablet Take 4 mg by mouth 3 (three) times daily. 10/11/21 10/23/22  [provider]    Physical Exam: Vitals:   09/27/22 1645 09/28/22 1740 09/29/22 1619 09/30/22 0620  BP: (!) 161/92 128/83 (!) 153/96 126/65  Pulse: 73 72 74 78  Resp: 20   18  Temp:  97.7 F (  36.5 C) 98.2 F (36.8 C) 97.7 F (36.5 C)  TempSrc:    Oral  SpO2: 98% 97% 98% 98%  Weight:      Height:       Physical Exam Constitutional:      Appearance: She is obese.  HENT:     Head: Normocephalic and atraumatic.     Nose: Nose normal.     Mouth/Throat:     Mouth: Mucous membranes are moist.  Cardiovascular:     Rate and Rhythm: Normal rate and regular rhythm.  Pulmonary:     Effort: Pulmonary effort is normal.  Abdominal:     Comments: Obese abdomen  Nontender   Musculoskeletal:     Cervical back: Normal range of motion.     Comments: + distal RLE TTP and mild swelling    Neurological:     General: No focal deficit present.     Data Reviewed:   There are no new results to review at this time.    Family Communication: No family at present  Primary team communication: Yes-notified of treatment plan.  Thank you very much for involving [place] in the care of your patient.  Author: Floydene Flock, MD 09/30/2022 1:32 PM  For on call review [email or web address].

## 2022-09-30 NOTE — Group Note (Signed)
Date:  09/30/2022 Time:  9:38 PM  Group Topic/Focus:  Wellness Toolbox:   The focus of this group is to discuss various aspects of wellness, balancing those aspects and exploring ways to increase the ability to experience wellness.  Patients will create a wellness toolbox for use upon discharge.    Participation Level:  Active  Participation Quality:  Appropriate  Affect:  Appropriate  Cognitive:  Appropriate  Insight: Good  Engagement in Group:  Engaged  Modes of Intervention:  Discussion  Additional Comments:    Lenore Cordia 09/30/2022, 9:38 PM

## 2022-09-30 NOTE — Assessment & Plan Note (Signed)
Stable from a respiratory standpoint Continue home inhalers 

## 2022-09-30 NOTE — Plan of Care (Signed)
  Problem: Clinical Measurements: Goal: Ability to maintain clinical measurements within normal limits will improve Outcome: Progressing   Problem: Activity: Goal: Risk for activity intolerance will decrease Outcome: Progressing   Problem: Coping: Goal: Level of anxiety will decrease Outcome: Progressing   Problem: Pain Managment: Goal: General experience of comfort will improve Outcome: Not Progressing   Problem: Safety: Goal: Ability to remain free from injury will improve Outcome: Progressing

## 2022-09-30 NOTE — Assessment & Plan Note (Signed)
Management per primary team. °

## 2022-09-30 NOTE — Progress Notes (Signed)
Nursing Shift Note:  1900-0700  Attended Evening Group: yes Medication Compliant:  yes Behavior: cooperative; attention seeking Sleep Quality: good Significant Changes: mood seems improved.  Pt affect is brighter than prior days.   09/30/22 0600  Psych Admission Type (Psych Patients Only)  Admission Status Voluntary  Psychosocial Assessment  Patient Complaints Anxiety  Eye Contact Fair  Facial Expression Animated  Affect Anxious  Speech Loud  Interaction Attention-seeking  Motor Activity Unsteady  Appearance/Hygiene Unremarkable  Behavior Characteristics Cooperative  Mood Anxious  Thought Process  Coherency WDL  Content WDL  Delusions None reported or observed  Perception WDL  Hallucination None reported or observed  Judgment Impaired  Confusion Mild  Danger to Self  Current suicidal ideation? Passive  Agreement Not to Harm Self Yes  Description of Agreement verbal  Danger to Others  Danger to Others None reported or observed

## 2022-09-30 NOTE — Assessment & Plan Note (Signed)
Patient reports remote history of heart failure in the past Appears fairly euvolemic in the setting of large body habitus.  BNP normal Will monitor for now Imaging and volume status monitoring as clinically indicated.

## 2022-10-01 DIAGNOSIS — R531 Weakness: Principal | ICD-10-CM

## 2022-10-01 DIAGNOSIS — F332 Major depressive disorder, recurrent severe without psychotic features: Secondary | ICD-10-CM | POA: Diagnosis not present

## 2022-10-01 DIAGNOSIS — M79605 Pain in left leg: Secondary | ICD-10-CM

## 2022-10-01 LAB — CBC WITH DIFFERENTIAL/PLATELET
Abs Immature Granulocytes: 0.02 10*3/uL (ref 0.00–0.07)
Basophils Absolute: 0.1 10*3/uL (ref 0.0–0.1)
Basophils Relative: 1 %
Eosinophils Absolute: 0.2 10*3/uL (ref 0.0–0.5)
Eosinophils Relative: 3 %
HCT: 37.9 % (ref 36.0–46.0)
Hemoglobin: 12 g/dL (ref 12.0–15.0)
Immature Granulocytes: 0 %
Lymphocytes Relative: 27 %
Lymphs Abs: 1.6 10*3/uL (ref 0.7–4.0)
MCH: 29.6 pg (ref 26.0–34.0)
MCHC: 31.7 g/dL (ref 30.0–36.0)
MCV: 93.3 fL (ref 80.0–100.0)
Monocytes Absolute: 0.4 10*3/uL (ref 0.1–1.0)
Monocytes Relative: 7 %
Neutro Abs: 3.9 10*3/uL (ref 1.7–7.7)
Neutrophils Relative %: 62 %
Platelets: 219 10*3/uL (ref 150–400)
RBC: 4.06 MIL/uL (ref 3.87–5.11)
RDW: 13 % (ref 11.5–15.5)
WBC: 6.2 10*3/uL (ref 4.0–10.5)
nRBC: 0 % (ref 0.0–0.2)

## 2022-10-01 LAB — COMPREHENSIVE METABOLIC PANEL
ALT: 16 U/L (ref 0–44)
AST: 19 U/L (ref 15–41)
Albumin: 3.4 g/dL — ABNORMAL LOW (ref 3.5–5.0)
Alkaline Phosphatase: 51 U/L (ref 38–126)
Anion gap: 6 (ref 5–15)
BUN: 26 mg/dL — ABNORMAL HIGH (ref 6–20)
CO2: 26 mmol/L (ref 22–32)
Calcium: 8.6 mg/dL — ABNORMAL LOW (ref 8.9–10.3)
Chloride: 106 mmol/L (ref 98–111)
Creatinine, Ser: 0.68 mg/dL (ref 0.44–1.00)
GFR, Estimated: >60 mL/min (ref >60)
Glucose, Bld: 120 mg/dL — ABNORMAL HIGH (ref 70–99)
Potassium: 4.2 mmol/L (ref 3.5–5.1)
Sodium: 138 mmol/L (ref 135–145)
Total Bilirubin: 0.8 mg/dL (ref 0.3–1.2)
Total Protein: 7 g/dL (ref 6.5–8.1)

## 2022-10-01 LAB — BRAIN NATRIURETIC PEPTIDE: B Natriuretic Peptide: 46.6 pg/mL (ref 0.0–100.0)

## 2022-10-01 MED ORDER — ARIPIPRAZOLE 15 MG PO TABS: 15.0000 mg | ORAL_TABLET | Freq: Every day | ORAL | 0 refills | Status: DC

## 2022-10-01 MED ORDER — GABAPENTIN 300 MG PO CAPS: 600.0000 mg | ORAL_CAPSULE | Freq: Three times a day (TID) | ORAL | 0 refills | Status: DC

## 2022-10-01 MED ORDER — IBUPROFEN 800 MG PO TABS: 800.0000 mg | ORAL_TABLET | Freq: Three times a day (TID) | ORAL | 0 refills | Status: DC | PRN

## 2022-10-01 MED ORDER — PRAZOSIN HCL 5 MG PO CAPS: 5.0000 mg | ORAL_CAPSULE | Freq: Every day | ORAL | 0 refills | Status: DC

## 2022-10-01 MED ORDER — OXYBUTYNIN CHLORIDE ER 5 MG PO TB24: 5.0000 mg | ORAL_TABLET | Freq: Every day | ORAL | 0 refills | Status: AC

## 2022-10-01 MED ORDER — TOPIRAMATE 25 MG PO TABS: 25.0000 mg | ORAL_TABLET | ORAL | 0 refills | Status: DC

## 2022-10-01 MED ORDER — PANTOPRAZOLE SODIUM 40 MG PO TBEC: 40.0000 mg | DELAYED_RELEASE_TABLET | Freq: Every day | ORAL | 0 refills | Status: DC

## 2022-10-01 MED ORDER — FLUOXETINE HCL 40 MG PO CAPS: 40.0000 mg | ORAL_CAPSULE | Freq: Every day | ORAL | 0 refills | Status: DC

## 2022-10-01 MED ORDER — IBUPROFEN 600 MG PO TABS: 800.0000 mg | ORAL_TABLET | Freq: Three times a day (TID) | ORAL | Status: DC | PRN

## 2022-10-01 MED ORDER — ZOLPIDEM TARTRATE 5 MG PO TABS: 5.0000 mg | ORAL_TABLET | Freq: Every evening | ORAL | 0 refills | Status: DC | PRN

## 2022-10-01 MED ORDER — ATORVASTATIN CALCIUM 40 MG PO TABS: 40.0000 mg | ORAL_TABLET | Freq: Every day | ORAL | 0 refills | Status: DC

## 2022-10-01 NOTE — Group Note (Signed)
Recreation Therapy Group Note   Group Topic:Relaxation  Group Date: 10/01/2022 Start Time: 1000 End Time: 1100 Facilitators: Rosina Lowenstein, LRT, CTRS Location: Courtyard  Group Description: Meditation. LRT and patients discussed what they know about meditation and mindfulness. LRT played a Deep Breathing Meditation exercise script for patients to follow along to. LRT and patients discussed how meditation and deep breathing can be used as a coping skill post--discharge. After the meditation, LRT took song requests from pts to listen to while playing corn hole, getting fresh air, and sunlight.   Goal Area(s) Addressed: Patient will practice using relaxation technique. Patient will identify a new coping skill.  Patient will follow multistep directions to reduce anxiety and stress.   Affect/Mood: Elevated   Participation Level: Moderate   Participation Quality: Independent   Behavior: Alert   Speech/Thought Process: Coherent   Insight: Limited   Judgement: Fair    Modes of Intervention: Activity   Patient Response to Interventions:  Disengaged   Education Outcome:  Acknowledges education   Clinical Observations/Individualized Feedback: Daneya was somewhat active in their participation of session activities and group discussion. Pt shared that she did not want to sit with the group because she was worried about getting chalk on her skirt when she sat down. Pt played corn hole with peers after prompt ended. Pt interacted well with LRT and peers while in group.   Plan: Continue to engage patient in RT group sessions 2-3x/week.   Rosina Lowenstein, LRT, CTRS 10/01/2022 12:00 PM

## 2022-10-01 NOTE — Progress Notes (Addendum)
  [facility] Adult Case Management Discharge Plan :  Will you be returning to the same living situation after discharge:  Yes,  pt reports that she is returning to the [facility]. At discharge, do you have transportation home?: Yes,  CSW to assist with transportation needs. Do you have the ability to pay for your medications: Yes,  [employer] / [employer]  Release of information consent forms completed and in the chart;  Patient's signature needed at discharge.  Patient to Follow up at: Follow-up Information     [facility] Follow up.   Specialty: Professional Counselor Why: Appointment is scheduled for 10/23/2022 at 12:00 PM.  This appointment is for an assessment. Contact information: [facility] [address] [place] [address] [phone]         [facility] Follow up.   Why: Appointment is scheduled for 10/07/2022 at 11:00AM.  This is for medication management.  Appointment is face to face.  After this appointment.  Appointments can be telehealth if needed. Contact information: [address], [place], [place] [address] [phone]        [facility] Follow up.   Why: Appointment is scheudled for 10/09/2022 at 11AM. Contact information: [address] [place],  [place]  [address]  Main: [phone] Fax: [phone]  Hours:  Monday-Thursday 8AM to 6PM Friday 8AM to 12PM        Next level of care provider has access to [facility] Link:no  Safety Planning and Suicide Prevention discussed: Yes,  SPE completed with the patient.      Has patient been referred to the Quitline?: Patient does not use tobacco/nicotine products  Patient has been referred for addiction treatment: No known substance use disorder.  Harden Mo, LCSW 10/01/2022, 10:12 AM

## 2022-10-01 NOTE — Progress Notes (Signed)
Consultation Progress Note   Patient: [name] [name] FAO:[id number] DOB: [birth year] DOA: 09/24/2022 DOS: the patient was seen and examined on 10/01/2022 Primary service: Nelly Rout, MD  Brief hospital course: Taken from consult note.  [name] [name] is a 47 y.o. female with past medical history of obesity, bipolar disorder, CHF, depression, hypertension, seizure disorder currently on the psychiatric service for major depression.  We have been called to consult because of acute left lower extremity pain for the past 1 to 2 days.  Patient reports a remote history of DVT back in 1999.  Basic labs with CTA and lower extremity venous Doppler was ordered.  9/3: Vitals and labs stable.  Lower extremity venous Doppler negative for DVT and CTA was negative for PE or any other acute abnormality.  Patient has very nonspecific pain likely musculoskeletal.  She was requesting ibuprofen as it was helpful in the past.  She was given a prescription of ibuprofen 800 mg Q8 hourly as needed.  Internal medicine will sign off at this time. Please do not hesitate to reconsult if needed.     Assessment and Plan: * MDD (major depressive disorder), recurrent severe, without psychosis (HCC) Management per primary team  CHF (congestive heart failure) (HCC) Patient reports remote history of heart failure in the past Appears fairly euvolemic in the setting of large body habitus.  BNP normal Will monitor for now Imaging and volume status monitoring as clinically indicated.  Asthma, chronic Stable from a respiratory standpoint Continue home inhalers  Acute pain of left lower extremity Worsening left lower extremity redness and swelling over the past 1 to 2 days with noted prior history of DVT in the past Positive mild popliteal tenderness though body habitus is a confounding issue to exam Lower extremity venous Doppler was negative for DVT CTA was negative for PE or any other acute  abnormality  Likely musculoskeletal pain.  Patient was requesting ibuprofen 800 mg as it was helpful in the past. -Try ibuprofen 800 mg Q8 hourly as needed    TRH will sign off at present, please call us again when needed.  Subjective: Patient was seen and examined today.  Still having some leg pain but stating that it is better than before.  She was requesting ibuprofen as it has helped in the past.  Physical Exam: Vitals:   09/29/22 1619 09/30/22 0620 09/30/22 1642 09/30/22 2103  BP: (!) 153/96 126/65 (!) 154/87 131/88  Pulse: 74 78 64   Resp:  18    Temp: 98.2 F (36.8 C) 97.7 F (36.5 C) 99.1 F (37.3 C)   TempSrc:  Oral Oral   SpO2: 98% 98% 98%   Weight:      Height:       General.  Morbidly obese lady, in no acute distress. Pulmonary.  Lungs clear bilaterally, normal respiratory effort. CV.  Regular rate and rhythm, no JVD, rub or murmur. Abdomen.  Soft, nontender, nondistended, BS positive. CNS.  Alert and oriented .  No focal neurologic deficit. Extremities.  No edema, no cyanosis, pulses intact and symmetrical. Psychiatry.  Judgment and insight appears normal.   Data Reviewed: Prior data reviewed  Family Communication: No family at bedside  Time spent: 40 minutes.  This record has been created using Conservation officer, historic buildings. Errors have been sought and corrected,but may not always be located. Such creation errors do not reflect on the standard of care.   Author: Arnetha Courser, MD 10/01/2022 12:23 PM  For on call review [email or web address].
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
## 2022-10-01 NOTE — Progress Notes (Signed)
   10/01/22 1000  Psych Admission Type (Psych Patients Only)  Admission Status Voluntary  Psychosocial Assessment  Patient Complaints Anxiety  Eye Contact Brief  Facial Expression Animated  Affect Anxious  Speech Loud  Interaction Attention-seeking  Motor Activity Unsteady  Appearance/Hygiene In scrubs  Behavior Characteristics Cooperative  Mood Anxious  Thought Process  Coherency WDL  Content WDL  Delusions None reported or observed  Perception WDL  Hallucination None reported or observed  Judgment Impaired  Confusion Mild  Danger to Self  Current suicidal ideation? Denies  Agreement Not to Harm Self Yes  Danger to Others  Danger to Others None reported or observed   Patient denies SI/HI/AVH. Tolerated all medications and meals. All discharge instructions given and read to patient at time of discharge. All prescriptions given to patient and explained to drop at her desired pharmacy.  All patient belonging returned to patient and accounted for.

## 2022-10-01 NOTE — Hospital Course (Addendum)
Taken from consult note.  [name] [name] is a 47 y.o. female with past medical history of obesity, bipolar disorder, CHF, depression, hypertension, seizure disorder currently on the psychiatric service for major depression.  We have been called to consult because of acute left lower extremity pain for the past 1 to 2 days.  Patient reports a remote history of DVT back in 1999.  Basic labs with CTA and lower extremity venous Doppler was ordered.  9/3: Vitals and labs stable.  Lower extremity venous Doppler negative for DVT and CTA was negative for PE or any other acute abnormality.  Patient has very nonspecific pain likely musculoskeletal.  She was requesting ibuprofen as it was helpful in the past.  She was given a prescription of ibuprofen 800 mg Q8 hourly as needed.  Internal medicine will sign off at this time. Please do not hesitate to reconsult if needed.

## 2022-10-01 NOTE — Group Note (Signed)
Date:  10/01/2022 Time:  11:35 AM  Group Topic/Focus:  Dimensions of Wellness:   The focus of this group is to introduce the topic of wellness and discuss the role each dimension of wellness plays in total health.    Participation Level:  Active  Participation Quality:  Appropriate  Affect:  Appropriate  Cognitive:  Appropriate  Insight: Appropriate  Engagement in Group:  Engaged  Modes of Intervention:  Activity  Additional Comments:    Dan Scearce 10/01/2022, 11:35 AM

## 2022-10-01 NOTE — Discharge Summary (Signed)
Physician Discharge Summary Note  Patient:  [name] [name] is an 47 y.o., female MRN:  [id number] DOB:  1975/04/04 Patient phone:  [phone] (home)  Patient address:   [address] [place] [address],  Total Time spent with patient: 30 minutes  Date of Admission:  09/24/2022 Date of Discharge: 10/01/2022  Reason for Admission:  47 y/o female admitted for worsening depression and suicidal ideation.   Principal Problem: MDD (major depressive disorder), recurrent severe, without psychosis (HCC) Discharge Diagnoses: Principal Problem:   MDD (major depressive disorder), recurrent severe, without psychosis (HCC) Active Problems:   Acute pain of left lower extremity   Asthma, chronic   CHF (congestive heart failure) (HCC)   Weakness  Subjective: Pt chart reviewed, discussed with interdisciplinary team, and seen on unit. Reports mood today is "doing great today". States she feels ready for discharge. Plans on going back to the [facility]. Appears future oriented. States she plans on working with her lawyer to get disability. Feels her sleep and appetite have been fair. Would like to make a call today to speak with her friend so she can get her belongings. Denies suicidal, homicidal ideations. Denies auditory visual hallucinations or paranoia. Makes comments about trolls moving belongings around her room, although is easily redirectable. Appears to be fixed delusion. Reviewed medications at discharge. Pt would like print prescriptions.  Past Psychiatric History: bipolar disorder  Past Medical History:  Past Medical History:  Diagnosis Date   Asthma    Bipolar 1 disorder (HCC)    Cancer (HCC)    CHF (congestive heart failure) (HCC)    Depression    Hypertension    Obesity    PTSD (post-traumatic stress disorder)    Seizures (HCC)     Past Surgical History:  Procedure Laterality Date   CESAREAN SECTION     CHOLECYSTECTOMY     Family History: History reviewed. No pertinent family  history. Family Psychiatric  History: Reports mother was diagnosed with depression, bipolar disorder, ptsd  Social History:  Social History   Substance and Sexual Activity  Alcohol Use Yes     Social History   Substance and Sexual Activity  Drug Use No    Social History   Socioeconomic History   Marital status: Single    Spouse name: Not on file   Number of children: Not on file   Years of education: Not on file   Highest education level: Not on file  Occupational History   Not on file  Tobacco Use   Smoking status: Never   Smokeless tobacco: Never  Substance and Sexual Activity   Alcohol use: Yes   Drug use: No   Sexual activity: Not on file  Other Topics Concern   Not on file  Social History Narrative   Not on file   Social Determinants of Health   Financial Resource Strain: Not on file  Food Insecurity: No Food Insecurity (09/24/2022)   Hunger Vital Sign    Worried About Running Out of Food in the Last Year: Never true    Ran Out of Food in the Last Year: Never true  Recent Concern: Food Insecurity - Food Insecurity Present (09/24/2022)   Hunger Vital Sign    Worried About Running Out of Food in the Last Year: Often true    Ran Out of Food in the Last Year: Often true  Transportation Needs: No Transportation Needs (09/24/2022)   PRAPARE - Administrator, Civil Service (Medical): No  Lack of Transportation (Non-Medical): No  Physical Activity: Not on file  Stress: Not on file  Social Connections: Unknown (06/11/2021)   [facility] from [facility]   Social Network    Social Network: Not on file   Hospital Course:  [name] [name] was admitted for MDD (major depressive disorder), recurrent severe, without psychosis (HCC) and crisis management. [name] [name] was discharged with current medication and was instructed on how to take medications as prescribed; (details listed below under Medication List).  Medical problems were  identified and treated as needed.  Home medications were restarted as appropriate.  Improvement was monitored by observation and [name] [name] daily report of symptom reduction.  Emotional and mental status was monitored by daily self-inventory reports completed by [name] [name] and clinical staff.         [name] [name] was evaluated by the treatment team for stability and plans for continued recovery upon discharge.  [name] [name] motivation was an integral factor for scheduling further treatment.  Employment, transportation, bed availability, health status, family support, and any pending legal issues were also considered during her hospital stay. She was offered further treatment options upon discharge including but not limited to Residential, Intensive Outpatient, and Outpatient treatment.  [name] [name] will follow up with the services as listed below under Follow Up Information.     Upon completion of this admission the [name] [name] was both mentally and medically stable for discharge denying suicidal/homicidal ideation, auditory/visual/tactile hallucinations, delusional thoughts and paranoia.     Physical Findings: AIMS:  , ,  ,  ,    CIWA:    COWS:     Musculoskeletal: Strength & Muscle Tone:  ambulates with a walker Gait & Station:  ambulates with a walker Patient leans:  ambulates with a walker  Psychiatric Specialty Exam:  Presentation  General Appearance:  Appropriate for Environment; Fairly Groomed  Eye Contact: Fair  Speech: Normal Rate  Speech Volume: Normal  Handedness: Right  Mood and Affect  Mood: -- ("doing great today")  Affect: Full Range  Thought Process  Thought Processes: Coherent  Descriptions of Associations:Intact  Orientation:Full (Time, Place and Person)  Thought Content:Other (comment) (fixed delusion about trolls)  History of Schizophrenia/Schizoaffective disorder:No  Hallucinations:Hallucinations: None  Ideas  of Reference:Delusions; Other (comment) (fixed delusion about trolls)  Suicidal Thoughts:Suicidal Thoughts: No  Homicidal Thoughts:Homicidal Thoughts: No  Sensorium  Memory: Immediate Fair  Judgment: Intact  Insight: Present   Executive Functions  Concentration: Fair  Attention Span: Fair  Recall: Fair  Fund of Knowledge: Fair  Language: Fair  Psychomotor Activity  Psychomotor Activity: Psychomotor Activity: Normal  Assets  Assets: Communication Skills; Desire for Improvement; Financial Resources/Insurance; Resilience  Sleep  Sleep: Sleep: Fair  Physical Exam: Physical Exam Constitutional:      General: She is not in acute distress.    Appearance: She is not ill-appearing, toxic-appearing or diaphoretic.  Eyes:     General: No scleral icterus. Cardiovascular:     Rate and Rhythm: Normal rate.  Pulmonary:     Effort: Pulmonary effort is normal. No respiratory distress.  Neurological:     Mental Status: She is alert and oriented to person, place, and time.  Psychiatric:        Attention and Perception: Attention and perception normal.        Mood and Affect: Mood and affect normal.        Speech: Speech normal.        Behavior: Behavior normal. Behavior is  cooperative.        Thought Content: Thought content is delusional. Thought content is not paranoid. Thought content does not include homicidal or suicidal ideation. Thought content does not include homicidal or suicidal plan.    Review of Systems  Constitutional:  Negative for chills and fever.  Respiratory:  Negative for shortness of breath.   Cardiovascular:  Negative for chest pain and palpitations.  Gastrointestinal:  Negative for abdominal pain.  Neurological:  Negative for headaches.  Psychiatric/Behavioral:  Negative for depression. The patient is not nervous/anxious.    Blood pressure 131/88, pulse 64, temperature 99.1 F (37.3 C), temperature source Oral, resp. rate 18, height 5\' 2"   (1.575 m), weight (!) 139.3 kg, SpO2 98%. Body mass index is 56.15 kg/m.   Social History   Tobacco Use  Smoking Status Never  Smokeless Tobacco Never   Tobacco Cessation:  A prescription for an FDA-approved tobacco cessation medication was offered at discharge and the patient refused   Blood Alcohol level:  Lab Results  Component Value Date   ETH <10 09/24/2022   ETH 80 (H) 09/04/2022    Metabolic Disorder Labs:  Lab Results  Component Value Date   HGBA1C 5.5 09/24/2022   MPG 111.15 09/24/2022   No results found for: "PROLACTIN" Lab Results  Component Value Date   CHOL 126 09/29/2022   TRIG 144 09/29/2022   HDL 55 09/29/2022   CHOLHDL 2.3 09/29/2022   VLDL 29 09/29/2022   LDLCALC 42 09/29/2022    See Psychiatric Specialty Exam and Suicide Risk Assessment completed by Attending Physician prior to discharge.  Discharge destination:  Home  Is patient on multiple antipsychotic therapies at discharge:  No   Has Patient had three or more failed trials of antipsychotic monotherapy by history:  No  Recommended Plan for Multiple Antipsychotic Therapies: NA  Discharge Instructions     Ambulatory referral to Physical Therapy   Complete by: As directed       Allergies as of 10/01/2022       Reactions   Aspirin Anaphylaxis, Rash   Bee Venom Anaphylaxis, Rash   Sulfa Antibiotics Anaphylaxis   Ultram [tramadol] Other (See Comments)   Migraines        Medication List     STOP taking these medications    ARIPiprazole 5 MG tablet Commonly known as: ABILIFY Replaced by: ARIPiprazole 15 MG tablet   benztropine 0.5 MG tablet Commonly known as: COGENTIN   diclofenac 75 MG EC tablet Commonly known as: VOLTAREN   diphenhydrAMINE 25 MG tablet Commonly known as: BENADRYL   docusate sodium 250 MG capsule Commonly known as: COLACE   hydrOXYzine 25 MG tablet Commonly known as: ATARAX   lamoTRIgine 25 MG tablet Commonly known as: LAMICTAL   meclizine 12.5  MG tablet Commonly known as: ANTIVERT   naproxen sodium 220 MG tablet Commonly known as: ALEVE   omeprazole 40 MG capsule Commonly known as: PRILOSEC   ondansetron 4 MG disintegrating tablet Commonly known as: ZOFRAN-ODT   Precision QID Test test strip Generic drug: glucose blood   promethazine 25 MG tablet Commonly known as: PHENERGAN   Rexulti 1 MG Tabs tablet Generic drug: brexpiprazole   SUMAtriptan 100 MG tablet Commonly known as: IMITREX   tiZANidine 4 MG tablet Commonly known as: ZANAFLEX       TAKE these medications      Indication  albuterol 108 (90 Base) MCG/ACT inhaler Commonly known as: VENTOLIN HFA Inhale 2 puffs into the lungs every  6 (six) hours as needed for wheezing or shortness of breath (SOB).  Indication: Asthma   ARIPiprazole 15 MG tablet Commonly known as: ABILIFY Take 1 tablet (15 mg total) by mouth daily for 14 days. Start taking on: October 02, 2022 Replaces: ARIPiprazole 5 MG tablet  Indication: Major Depressive Disorder   atorvastatin 40 MG tablet Commonly known as: LIPITOR Take 1 tablet (40 mg total) by mouth daily for 14 days.  Indication: High Amount of Fats in the Blood   EPINEPHrine 0.3 mg/0.3 mL Soaj injection Commonly known as: EPI-PEN Inject 0.3 mg into the muscle as needed for anaphylaxis.  Indication: Life-Threatening Hypersensitivity Reaction   FLUoxetine 40 MG capsule Commonly known as: PROZAC Take 1 capsule (40 mg total) by mouth daily for 14 days. Start taking on: October 02, 2022 What changed: when to take this  Indication: Major Depressive Disorder   fluticasone-salmeterol 100-50 MCG/ACT Aepb Commonly known as: ADVAIR Inhale 1 puff into the lungs 2 (two) times daily.  Indication: Asthma   gabapentin 300 MG capsule Commonly known as: NEURONTIN Take 2 capsules (600 mg total) by mouth 3 (three) times daily for 14 days. What changed: Another medication with the same name was removed. Continue taking this  medication, and follow the directions you see here.  Indication: Generalized Anxiety Disorder   ibuprofen 800 MG tablet Commonly known as: ADVIL Take 1 tablet (800 mg total) by mouth every 8 (eight) hours as needed for moderate pain. What changed:  medication strength how much to take when to take this reasons to take this  Indication: Muscle Pain   oxybutynin 5 MG 24 hr tablet Commonly known as: DITROPAN-XL Take 1 tablet (5 mg total) by mouth daily after supper for 14 days.  Indication: Bedwetting   pantoprazole 40 MG tablet Commonly known as: PROTONIX Take 1 tablet (40 mg total) by mouth daily for 14 days. Start taking on: October 02, 2022  Indication: Gastroesophageal Reflux Disease   prazosin 5 MG capsule Commonly known as: MINIPRESS Take 1 capsule (5 mg total) by mouth at bedtime for 14 days.  Indication: Frightening Dreams   topiramate 25 MG tablet Commonly known as: TOPAMAX Take 1 tablet (25 mg total) by mouth 2 (two) times daily at 8 am and 4 pm for 14 days.  Indication: mood stabilization   zolpidem 5 MG tablet Commonly known as: AMBIEN Take 1 tablet (5 mg total) by mouth at bedtime as needed for up to 14 days for sleep.  Indication: Trouble Sleeping        Follow-up Information     [facility] Follow up.   Specialty: Professional Counselor Why: Appointment is scheduled for 10/23/2022 at 12:00 PM.  This appointment is for an assessment. Contact information: [facility] [address] [place] [address] [phone]         [facility] Follow up.   Why: Appointment is scheduled for 10/07/2022 at 11:00AM.  This is for medication management.  Appointment is face to face.  After this appointment.  Appointments can be telehealth if needed. Contact information: [address], [place], [place] [address] [phone]        [facility] Follow up.   Why: Appointment is scheudled for 10/09/2022 at 11AM. Contact information: [address] [place],  [place]  [address]  Main: [phone] Fax: [phone]  Hours:  Monday-Thursday 8AM to 6PM Friday 8AM to  12PM               Follow-up recommendations:   Follow up with outpatient psychiatry and counseling  Signed: Lauree Chandler, NP 10/01/2022, 2:33 PM

## 2022-10-01 NOTE — Progress Notes (Signed)
   10/01/22 1100  Spiritual Encounters  Type of Visit Initial  Care provided to: Patient  Conversation partners present during encounter Nurse;Social worker/Care management/TOC  Referral source Chaplain assessment  Reason for visit Grief/loss  OnCall Visit No  Spiritual Framework  Presenting Themes Coping tools;Meaning/purpose/sources of inspiration;Courage hope and growth;Impactful experiences and emotions  Community/Connection Family  Patient Stress Factors Loss;Family relationships  Family Stress Factors Loss;Major life changes  Interventions  Spiritual Care Interventions Made Compassionate presence;Established relationship of care and support;Reflective listening;Encouragement  Intervention Outcomes  Outcomes Awareness around self/spiritual resourses;Awareness of support   Chaplain went to visit with patient to see how she is. Patient informed the chaplain that she is leaving the hospital today and she is happy about that. Patient shared with the chaplain that her 49 year old grand daughter recently died and that the circumstances made her very depressed and hurt. Patients family is in [place] and she was hurt that she could not be there with them during this loss. Patient is grieving but stated that her grand daughter is her angel and has been visiting her in her dreams. Chaplain offered words of encouragement and hope.

## 2022-10-06 ENCOUNTER — Other Ambulatory Visit: Payer: Self-pay

## 2022-10-06 ENCOUNTER — Encounter (HOSPITAL_COMMUNITY): Payer: Self-pay | Admitting: *Deleted

## 2022-10-06 ENCOUNTER — Emergency Department (HOSPITAL_COMMUNITY): Payer: MEDICAID

## 2022-10-06 ENCOUNTER — Emergency Department (HOSPITAL_COMMUNITY)
Admission: EM | Admit: 2022-10-06 | Discharge: 2022-10-06 | Disposition: A | Discharge status: Home / Self Care | Payer: MEDICAID | Attending: Emergency Medicine | Admitting: Emergency Medicine

## 2022-10-06 DIAGNOSIS — I2510 Atherosclerotic heart disease of native coronary artery without angina pectoris: Secondary | ICD-10-CM | POA: Insufficient documentation

## 2022-10-06 DIAGNOSIS — R0789 Other chest pain: Secondary | ICD-10-CM | POA: Diagnosis present

## 2022-10-06 DIAGNOSIS — I110 Hypertensive heart disease with heart failure: Secondary | ICD-10-CM | POA: Diagnosis not present

## 2022-10-06 DIAGNOSIS — R079 Chest pain, unspecified: Secondary | ICD-10-CM

## 2022-10-06 DIAGNOSIS — I509 Heart failure, unspecified: Secondary | ICD-10-CM | POA: Diagnosis not present

## 2022-10-06 DIAGNOSIS — R112 Nausea with vomiting, unspecified: Secondary | ICD-10-CM | POA: Diagnosis not present

## 2022-10-06 LAB — URINALYSIS, ROUTINE W REFLEX MICROSCOPIC
Bilirubin Urine: NEGATIVE
Glucose, UA: NEGATIVE mg/dL
Hgb urine dipstick: NEGATIVE
Ketones, ur: 5 mg/dL — AB
Leukocytes,Ua: NEGATIVE
Nitrite: NEGATIVE
Protein, ur: 30 mg/dL — AB
Specific Gravity, Urine: 1.03 (ref 1.005–1.030)
pH: 5 (ref 5.0–8.0)

## 2022-10-06 LAB — CBC WITH DIFFERENTIAL/PLATELET
Abs Immature Granulocytes: 0.04 10*3/uL (ref 0.00–0.07)
Basophils Absolute: 0.1 10*3/uL (ref 0.0–0.1)
Basophils Relative: 1 %
Eosinophils Absolute: 0.2 10*3/uL (ref 0.0–0.5)
Eosinophils Relative: 2 %
HCT: 40 % (ref 36.0–46.0)
Hemoglobin: 13.1 g/dL (ref 12.0–15.0)
Immature Granulocytes: 1 %
Lymphocytes Relative: 21 %
Lymphs Abs: 1.9 10*3/uL (ref 0.7–4.0)
MCH: 30.5 pg (ref 26.0–34.0)
MCHC: 32.8 g/dL (ref 30.0–36.0)
MCV: 93.2 fL (ref 80.0–100.0)
Monocytes Absolute: 0.7 10*3/uL (ref 0.1–1.0)
Monocytes Relative: 8 %
Neutro Abs: 5.9 10*3/uL (ref 1.7–7.7)
Neutrophils Relative %: 67 %
Platelets: 283 10*3/uL (ref 150–400)
RBC: 4.29 MIL/uL (ref 3.87–5.11)
RDW: 13.2 % (ref 11.5–15.5)
WBC: 8.8 10*3/uL (ref 4.0–10.5)
nRBC: 0 % (ref 0.0–0.2)

## 2022-10-06 LAB — CBG MONITORING, ED: Glucose-Capillary: 68 mg/dL — ABNORMAL LOW (ref 70–99)

## 2022-10-06 LAB — HCG, SERUM, QUALITATIVE: Preg, Serum: NEGATIVE

## 2022-10-06 LAB — COMPREHENSIVE METABOLIC PANEL
ALT: 19 U/L (ref 0–44)
AST: 23 U/L (ref 15–41)
Albumin: 3.8 g/dL (ref 3.5–5.0)
Alkaline Phosphatase: 63 U/L (ref 38–126)
Anion gap: 10 (ref 5–15)
BUN: 18 mg/dL (ref 6–20)
CO2: 24 mmol/L (ref 22–32)
Calcium: 8.8 mg/dL — ABNORMAL LOW (ref 8.9–10.3)
Chloride: 106 mmol/L (ref 98–111)
Creatinine, Ser: 0.97 mg/dL (ref 0.44–1.00)
GFR, Estimated: >60 mL/min (ref >60)
Glucose, Bld: 81 mg/dL (ref 70–99)
Potassium: 3.5 mmol/L (ref 3.5–5.1)
Sodium: 140 mmol/L (ref 135–145)
Total Bilirubin: 1.3 mg/dL — ABNORMAL HIGH (ref 0.3–1.2)
Total Protein: 7.8 g/dL (ref 6.5–8.1)

## 2022-10-06 LAB — TROPONIN I (HIGH SENSITIVITY)
Troponin I (High Sensitivity): 5 ng/L (ref <18)
Troponin I (High Sensitivity): 5 ng/L (ref <18)

## 2022-10-06 LAB — D-DIMER, QUANTITATIVE: D-Dimer, Quant: 0.88 ug{FEU}/mL — ABNORMAL HIGH (ref 0.00–0.50)

## 2022-10-06 LAB — LIPASE, BLOOD: Lipase: 26 U/L (ref 11–51)

## 2022-10-06 MED ORDER — LIDOCAINE VISCOUS HCL 2 % MT SOLN
15.0000 mL | Freq: Once | OROMUCOSAL | Status: AC
  Administered 2022-10-06: 15 mL via ORAL
  Filled 2022-10-06: qty 15

## 2022-10-06 MED ORDER — IOHEXOL 350 MG/ML SOLN
75.0000 mL | Freq: Once | INTRAVENOUS | Status: AC | PRN
  Administered 2022-10-06: 75 mL via INTRAVENOUS

## 2022-10-06 MED ORDER — ALUM & MAG HYDROXIDE-SIMETH 400-400-40 MG/5ML PO SUSP: 15.0000 mL | Freq: Four times a day (QID) | ORAL | 0 refills | Status: DC | PRN

## 2022-10-06 MED ORDER — PROMETHAZINE HCL 25 MG PO TABS: 25.0000 mg | ORAL_TABLET | Freq: Four times a day (QID) | ORAL | 0 refills | Status: DC | PRN

## 2022-10-06 MED ORDER — ALUM & MAG HYDROXIDE-SIMETH 200-200-20 MG/5ML PO SUSP
30.0000 mL | Freq: Once | ORAL | Status: AC
  Administered 2022-10-06: 30 mL via ORAL
  Filled 2022-10-06: qty 30

## 2022-10-06 MED ORDER — MORPHINE SULFATE (PF) 4 MG/ML IV SOLN
4.0000 mg | Freq: Once | INTRAVENOUS | Status: AC
  Administered 2022-10-06: 4 mg via INTRAVENOUS
  Filled 2022-10-06: qty 1

## 2022-10-06 MED ORDER — ONDANSETRON HCL 4 MG/2ML IJ SOLN
4.0000 mg | Freq: Once | INTRAMUSCULAR | Status: AC
  Administered 2022-10-06: 4 mg via INTRAVENOUS
  Filled 2022-10-06: qty 2

## 2022-10-06 NOTE — ED Provider Notes (Signed)
[place] EMERGENCY DEPARTMENT AT [facility] Provider Note   CSN: [id number] Arrival date & time: 10/06/22  1106     History  Chief Complaint  Patient presents with   Chest Pain    Margherita Gaskins is a 47 y.o. female.   Chest Pain 47 year old female history of bipolar 1, CHF, hypertension, CAD status post stent in 2004, polysubstance use presenting for chest pain.  She states since yesterday she has had chest pain.  It is left-sided, goes around the left side of her chest under her breast.  She took some nitro at home with some improvement.  Continued this morning so she presented for evaluation.  She feels her blood sugars been borderline today as well.  Chest pain comes and goes, nonpleuritic.  Somewhat different than prior ACS.  No radiation to her back.  No pleuritic pain.  She was recently hospitalized at a psychiatric unit.  She is otherwise been at her baseline health.  She states she has not seen a cardiologist in some time.  She does report some nausea and vomiting peer denies any abdominal pain.  No urinary symptoms or vaginal bleeding or discharge.     Home Medications Prior to Admission medications   Medication Sig Start Date End Date Taking? Authorizing Provider  albuterol (PROVENTIL HFA;VENTOLIN HFA) 108 (90 BASE) MCG/ACT inhaler Inhale 2 puffs into the lungs every 6 (six) hours as needed for wheezing or shortness of breath (SOB).    [provider]  ARIPiprazole (ABILIFY) 15 MG tablet Take 1 tablet (15 mg total) by mouth daily for 14 days. 10/02/22 10/16/22  Lauree Chandler, NP  atorvastatin (LIPITOR) 40 MG tablet Take 1 tablet (40 mg total) by mouth daily for 14 days. 10/01/22 10/15/22  Lauree Chandler, NP  EPINEPHrine 0.3 mg/0.3 mL IJ SOAJ injection Inject 0.3 mg into the muscle as needed for anaphylaxis. 08/19/18   [provider]  FLUoxetine (PROZAC) 40 MG capsule Take 1 capsule (40 mg total) by mouth daily for 14 days. 10/02/22 10/16/22   Lauree Chandler, NP  fluticasone-salmeterol (ADVAIR) 100-50 MCG/ACT AEPB Inhale 1 puff into the lungs 2 (two) times daily. 08/23/22 08/23/23  [provider]  gabapentin (NEURONTIN) 300 MG capsule Take 2 capsules (600 mg total) by mouth 3 (three) times daily for 14 days. 10/01/22 10/15/22  Lauree Chandler, NP  ibuprofen (ADVIL) 800 MG tablet Take 1 tablet (800 mg total) by mouth every 8 (eight) hours as needed for moderate pain. 10/01/22   Arnetha Courser, MD  oxybutynin (DITROPAN-XL) 5 MG 24 hr tablet Take 1 tablet (5 mg total) by mouth daily after supper for 14 days. 10/01/22 10/15/22  Lauree Chandler, NP  pantoprazole (PROTONIX) 40 MG tablet Take 1 tablet (40 mg total) by mouth daily for 14 days. 10/02/22 10/16/22  Lauree Chandler, NP  prazosin (MINIPRESS) 5 MG capsule Take 1 capsule (5 mg total) by mouth at bedtime for 14 days. 10/01/22 10/15/22  Lauree Chandler, NP  topiramate (TOPAMAX) 25 MG tablet Take 1 tablet (25 mg total) by mouth 2 (two) times daily at 8 am and 4 pm for 14 days. 10/01/22 10/15/22  Lauree Chandler, NP  zolpidem (AMBIEN) 5 MG tablet Take 1 tablet (5 mg total) by mouth at bedtime as needed for up to 14 days for sleep. 10/01/22 10/15/22  Lauree Chandler, NP      Allergies    Aspirin, Bee venom, Sulfa antibiotics, and Ultram [tramadol]  Review of Systems   Review of Systems  Cardiovascular:  Positive for chest pain.  Review of systems completed and notable as per HPI.  ROS otherwise negative.   Physical Exam Updated Vital Signs BP (!) 136/104   Pulse (!) 57   Temp 97.7 F (36.5 C) (Oral)   Resp 15   Ht 5' 2.5" (1.588 m)   Wt 121.1 kg   SpO2 100%   BMI 48.06 kg/m  Physical Exam Vitals and nursing note reviewed.  Constitutional:      General: She is not in acute distress.    Appearance: She is well-developed.  HENT:     Head: Normocephalic and atraumatic.  Eyes:     Extraocular Movements: Extraocular movements intact.      Conjunctiva/sclera: Conjunctivae normal.     Pupils: Pupils are equal, round, and reactive to light.  Cardiovascular:     Rate and Rhythm: Normal rate and regular rhythm.     Pulses:          Radial pulses are 2+ on the right side and 2+ on the left side.       Dorsalis pedis pulses are 2+ on the right side and 2+ on the left side.     Heart sounds: No murmur heard. Pulmonary:     Effort: Pulmonary effort is normal. No respiratory distress.     Breath sounds: Normal breath sounds.  Chest:     Chest wall: Tenderness present.     Comments: Left-sided chest wall tenderness reproduces pain.  No rash or skin changes. Abdominal:     Palpations: Abdomen is soft.     Tenderness: There is no abdominal tenderness.  Musculoskeletal:        General: No swelling.     Cervical back: Neck supple.     Right lower leg: No tenderness. No edema.     Left lower leg: No tenderness. No edema.  Skin:    General: Skin is warm and dry.     Capillary Refill: Capillary refill takes less than 2 seconds.  Neurological:     General: No focal deficit present.     Mental Status: She is alert and oriented to person, place, and time.  Psychiatric:        Mood and Affect: Mood normal.     ED Results / Procedures / Treatments   Labs (all labs ordered are listed, but only abnormal results are displayed) Labs Reviewed  COMPREHENSIVE METABOLIC PANEL - Abnormal; Notable for the following components:      Result Value   Calcium 8.8 (*)    Total Bilirubin 1.3 (*)    All other components within normal limits  URINALYSIS, ROUTINE W REFLEX MICROSCOPIC - Abnormal; Notable for the following components:   Color, Urine AMBER (*)    APPearance CLOUDY (*)    Ketones, ur 5 (*)    Protein, ur 30 (*)    Bacteria, UA FEW (*)    All other components within normal limits  CBG MONITORING, ED - Abnormal; Notable for the following components:   Glucose-Capillary 68 (*)    All other components within normal limits  LIPASE,  BLOOD  CBC WITH DIFFERENTIAL/PLATELET  D-DIMER, QUANTITATIVE  HCG, SERUM, QUALITATIVE  CBG MONITORING, ED  TROPONIN I (HIGH SENSITIVITY)  TROPONIN I (HIGH SENSITIVITY)    EKG None  Radiology DG Chest 2 View  Result Date: 10/06/2022 CLINICAL DATA:  Acute chest pain beginning yesterday. EXAM: CHEST - 2 VIEW COMPARISON:  09/04/2022  FINDINGS: The heart size and mediastinal contours are within normal limits. Both lungs are clear. The visualized skeletal structures are unremarkable. IMPRESSION: No active disease. Electronically Signed   By: Danae Orleans M.D.   On: 10/06/2022 12:59    Procedures Procedures    Medications Ordered in ED Medications  morphine (PF) 4 MG/ML injection 4 mg (4 mg Intravenous Not Given 10/06/22 1540)  ondansetron (ZOFRAN) injection 4 mg (4 mg Intravenous Given 10/06/22 1402)  alum & mag hydroxide-simeth (MAALOX/MYLANTA) 200-200-20 MG/5ML suspension 30 mL (30 mLs Oral Given 10/06/22 1425)    And  lidocaine (XYLOCAINE) 2 % viscous mouth solution 15 mL (15 mLs Oral Given 10/06/22 1425)    ED Course/ Medical Decision Making/ A&P Clinical Course as of 10/06/22 1558  Sun Oct 06, 2022  1428 On reassessment she reports burning-like pain like her typical GERD.  I wonder if this could be causing her pain.  Will trial Maalox, lidocaine. [JD]  1540 Received sign out from Dr. Earlene Plater pending d-dimer. Has 2 negative troponins. If D-dimer is negative can discharge. Had a low glucose and will be re-checked. [WS]    Clinical Course User Index [JD] Laurence Spates, MD [WS] Lonell Grandchild, MD                                 Medical Decision Making Amount and/or Complexity of Data Reviewed Labs: ordered.  Risk OTC drugs. Prescription drug management.   Medical Decision Making:   Oleva Piontek is a 47 y.o. female who presented to the ED today with chest pain.  Vital signs reviewed.  She does have history of prior stent but is not seen cardiology in some time.  Pain is  left-sided, seems somewhat in the chest wall and somewhat atypical.  Easily reproducible on exam.  She also states that there is some component of GERD.  She is good pulses all extremities, no mediastinal widening, nature of pain seems less consistent with dissection.  She is also normotensive.  Will obtain chest x-ray, troponin for evaluation.  She has had some nausea but benign abdominal exam, low concern for acute intra-abdominal pathology.  She was recent hospitalized, will obtain D-dimer for evaluation PE.  EKG reassuring, no signs of acute ischemia or arrhythmia.   Patient placed on continuous vitals and telemetry monitoring while in ED which was reviewed periodically.  Reviewed and confirmed nursing documentation for past medical history, family history, social history.  Reassessment and Plan:   On reassessment pain slightly improved.  No additional nausea or vomiting.  Blood sugar slightly low, she is tolerating p.o., will give juice and recheck.  Workup so far is reassuring.  She is negative troponin x 2.  No signs of UTI.  CBC, CMP are grossly unremarkable.  D-dimer is pending.  Chest x-ray reviewed, no signs of acute pathology including pneumonia pneumothorax, pulmonary edema.  D-dimer pending.  Dr. Suezanne Jacquet at 3:30 PM with plan to follow-up D-dimer and reassess.   Patient's presentation is most consistent with acute complicated illness / injury requiring diagnostic workup.           Final Clinical Impression(s) / ED Diagnoses Final diagnoses:  Chest pain, unspecified type    Rx / DC Orders ED Discharge Orders     None         Laurence Spates, MD 10/06/22 1558

## 2022-10-06 NOTE — ED Provider Notes (Signed)
   Procedures  Ultrasound ED Peripheral IV (Provider)  Date/Time: 10/06/2022 7:57 PM  Performed by: Lonell Grandchild, MD Authorized by: Lonell Grandchild, MD   Procedure details:    Indications: multiple failed IV attempts and poor IV access     Skin Prep: chlorhexidine gluconate     Location:  Right AC   Angiocath:  18 G   Bedside Ultrasound Guided: Yes     Images: not archived     Patient tolerated procedure without complications: Yes     Dressing applied: Yes     ED Course / MDM   Clinical Course as of 10/06/22 1958  Sun Oct 06, 2022  1428 On reassessment she reports burning-like pain like her typical GERD.  I wonder if this could be causing her pain.  Will trial Maalox, lidocaine. [JD]  1540 Received sign out from Dr. Earlene Plater pending d-dimer. Has 2 negative troponins. If D-dimer is negative can discharge. Had a low glucose and will be re-checked. [WS]  1757 Ultrasound IV placed.  D-dimer was positive.  She is also complaining of some abdominal pain so we will add CT abdomen to CT chest for PE. [WS]  1953 CTA chest, CT abdomen negative.  Suspect symptoms are likely gastritis or GERD related.  Patient reports history of frequent migraines, reports lately has been taking ibuprofen daily.  She also reports that the symptoms began after she started taking ibuprofen daily.  She will cut back on her ibuprofen use. Advised follow up with GI. Will discharge patient to home. All questions answered. Patient comfortable with plan of discharge. Return precautions discussed with patient and specified on the after visit summary.  [WS]    Clinical Course User Index [JD] Laurence Spates, MD [WS] Lonell Grandchild, MD   Medical Decision Making Amount and/or Complexity of Data Reviewed Labs: ordered. Radiology: ordered.  Risk OTC drugs. Prescription drug management.         Lonell Grandchild, MD 10/06/22 [phone]

## 2022-10-06 NOTE — ED Notes (Signed)
Provider at bedside with [place] attempting to place US guided PIV for blood draw and medication demonstration.

## 2022-10-06 NOTE — ED Provider Triage Note (Signed)
Emergency Medicine Provider Triage Evaluation Note  Jesalyn Reutzel , a 47 y.o. female  was evaluated in triage.  Pt complains of chest pain since 4 PM yesterday.  Pain is in the center chest, constant, nonradiating.  Patient has taken 4 nitros in the last 4 hours last dose was 10 AM today with minimal relief.  She also stated that this morning she started having diarrhea with blood in her stool and generalized abdominal pain.  Denies fever, nausea, vomiting or urinary symptoms, bowel changes.  Review of Systems  Positive: As above Negative: As above  Physical Exam  BP 124/75 (BP Location: Left Arm)   Pulse 64   Temp (!) 97.4 F (36.3 C)   Resp 20   Ht 5' 2.5" (1.588 m)   Wt 121.1 kg   SpO2 100%   BMI 48.06 kg/m  Gen:   Awake, no distress   Resp:  Normal effort  MSK:   Moves extremities without difficulty  Other:    Medical Decision Making  Medically screening exam initiated at 11:51 AM.  Appropriate orders placed.  Nakyah Mcgillis was informed that the remainder of the evaluation will be completed by another provider, this initial triage assessment does not replace that evaluation, and the importance of remaining in the ED until their evaluation is complete.    Jeanelle Malling, [place] 10/06/22 1153

## 2022-10-06 NOTE — ED Notes (Signed)
Pt well appearing upon transport to CT.

## 2022-10-06 NOTE — ED Notes (Signed)
Pt up to bathroom with stable gait. 

## 2022-10-06 NOTE — ED Notes (Signed)
ED Provider at bedside. 

## 2022-10-06 NOTE — ED Notes (Signed)
Pt back from Ct. Denies any unmet needs at this time.

## 2022-10-06 NOTE — ED Notes (Signed)
PT to xray, well appearing upon transport.

## 2022-10-06 NOTE — Discharge Instructions (Addendum)
We evaluated you for your chest pain.  Your symptoms are most likely due to inflammation in your stomach.  Ibuprofen, but you have been taking daily, can cause irritation to your stomach lining.  Please try to take this as little as possible to let your stomach heal.  I have prescribed you medication for pain and nausea.  Please follow-up with gastroenterology.  You can follow-up with [facility]ology.  Please call them to schedule follow-up.

## 2022-10-06 NOTE — ED Triage Notes (Signed)
C/o chest pain onset 4 pm yest states she took a ntg pain went away and then she had to take another ntg at 6 pmwith some sob. Stats she had chest heaviness this am and took another ntg , states her BS was 71 this am. States her chest pain comes and goes.

## 2022-10-06 NOTE — ED Notes (Signed)
This RN assumed care of patient. Pt presented to the ED with intermittent sharp central chest pain in the center from home. Pt is resting on gurney at this time, respirations are spontaneous, even, unlabored and symmetrical bilaterally. Pt skin tone is appropriate for ethnicity, dry and warm. Pt connected to CCM, pulse ox and BP. Bed in lowest position and pt denies any unmet needs at this time.

## 2022-10-06 NOTE — ED Notes (Signed)
PIV lost d/t occlusion. Provider made aware.

## 2022-10-06 NOTE — ED Notes (Signed)
Provider at bedside

## 2022-10-09 ENCOUNTER — Ambulatory Visit: Payer: MEDICAID | Admitting: Physical Therapy

## 2022-10-17 ENCOUNTER — Emergency Department (HOSPITAL_COMMUNITY)
Admission: EM | Admit: 2022-10-17 | Discharge: 2022-10-17 | Disposition: A | Discharge status: Home / Self Care | Payer: MEDICAID | Attending: Emergency Medicine | Admitting: Emergency Medicine

## 2022-10-17 ENCOUNTER — Emergency Department (HOSPITAL_COMMUNITY): Payer: MEDICAID

## 2022-10-17 DIAGNOSIS — I509 Heart failure, unspecified: Secondary | ICD-10-CM | POA: Insufficient documentation

## 2022-10-17 DIAGNOSIS — J45909 Unspecified asthma, uncomplicated: Secondary | ICD-10-CM | POA: Diagnosis not present

## 2022-10-17 DIAGNOSIS — W07XXXA Fall from chair, initial encounter: Secondary | ICD-10-CM | POA: Insufficient documentation

## 2022-10-17 DIAGNOSIS — S0990XA Unspecified injury of head, initial encounter: Secondary | ICD-10-CM | POA: Insufficient documentation

## 2022-10-17 DIAGNOSIS — R55 Syncope and collapse: Secondary | ICD-10-CM | POA: Diagnosis not present

## 2022-10-17 DIAGNOSIS — Z79899 Other long term (current) drug therapy: Secondary | ICD-10-CM | POA: Diagnosis not present

## 2022-10-17 DIAGNOSIS — I110 Hypertensive heart disease with heart failure: Secondary | ICD-10-CM | POA: Insufficient documentation

## 2022-10-17 DIAGNOSIS — M542 Cervicalgia: Secondary | ICD-10-CM | POA: Insufficient documentation

## 2022-10-17 DIAGNOSIS — Z7951 Long term (current) use of inhaled steroids: Secondary | ICD-10-CM | POA: Diagnosis not present

## 2022-10-17 LAB — URINALYSIS, ROUTINE W REFLEX MICROSCOPIC
Bilirubin Urine: NEGATIVE
Glucose, UA: NEGATIVE mg/dL
Hgb urine dipstick: NEGATIVE
Ketones, ur: NEGATIVE mg/dL
Leukocytes,Ua: NEGATIVE
Nitrite: NEGATIVE
Protein, ur: NEGATIVE mg/dL
Specific Gravity, Urine: 1.002 — ABNORMAL LOW (ref 1.005–1.030)
pH: 6 (ref 5.0–8.0)

## 2022-10-17 LAB — CBC
HCT: 41.4 % (ref 36.0–46.0)
Hemoglobin: 13.3 g/dL (ref 12.0–15.0)
MCH: 29.8 pg (ref 26.0–34.0)
MCHC: 32.1 g/dL (ref 30.0–36.0)
MCV: 92.8 fL (ref 80.0–100.0)
Platelets: 259 10*3/uL (ref 150–400)
RBC: 4.46 MIL/uL (ref 3.87–5.11)
RDW: 13.2 % (ref 11.5–15.5)
WBC: 8.2 10*3/uL (ref 4.0–10.5)
nRBC: 0 % (ref 0.0–0.2)

## 2022-10-17 LAB — BASIC METABOLIC PANEL
Anion gap: 9 (ref 5–15)
BUN: 15 mg/dL (ref 6–20)
CO2: 26 mmol/L (ref 22–32)
Calcium: 9.1 mg/dL (ref 8.9–10.3)
Chloride: 102 mmol/L (ref 98–111)
Creatinine, Ser: 0.55 mg/dL (ref 0.44–1.00)
GFR, Estimated: >60 mL/min (ref >60)
Glucose, Bld: 78 mg/dL (ref 70–99)
Potassium: 3.2 mmol/L — ABNORMAL LOW (ref 3.5–5.1)
Sodium: 137 mmol/L (ref 135–145)

## 2022-10-17 LAB — CBG MONITORING, ED: Glucose-Capillary: 78 mg/dL (ref 70–99)

## 2022-10-17 MED ORDER — ACETAMINOPHEN 500 MG PO TABS
1000.0000 mg | ORAL_TABLET | Freq: Once | ORAL | Status: AC
  Administered 2022-10-17: 1000 mg via ORAL
  Filled 2022-10-17: qty 2

## 2022-10-17 MED ORDER — ONDANSETRON 4 MG PO TBDP
4.0000 mg | ORAL_TABLET | Freq: Once | ORAL | Status: AC
  Administered 2022-10-17: 4 mg via ORAL
  Filled 2022-10-17: qty 1

## 2022-10-17 NOTE — ED Triage Notes (Signed)
Patient here from home [facility] reporting syncopal episode reporting hitting back of head and neck in between desk and bookshelf.

## 2022-10-17 NOTE — Discharge Instructions (Addendum)
You were seen in the ER after you passed out and hit your head.  As we discussed, your blood work, EKG, and head imaging all looked reassuring today. We did not find an emergent cause for your syncopal episode today. We gave you some tylenol for your headache and neck pain.   I recommend staying well hydrated and getting plenty of rest.   Continue to monitor how you're doing and return to the ER for new or worsening symptoms.

## 2022-10-17 NOTE — ED Provider Triage Note (Signed)
Emergency Medicine Provider Triage Evaluation Note  [name] [name] , a 47 y.o. female  was evaluated in triage.  Pt complains of syncopal episode. Was talking with case worker at the [facility] about her current manic episode. Felt she "came down too fast" after talking about serious emotional matters. Syncopized and fell out of chair, believes she hit her head because she feels a knot on her head. Complaining of bilateral neck pain. Does not believe she had a seizure, has hx of seizures  Review of Systems  Positive: Syncope, head injury, headache, neck pain Negative: CP, SOB, seizure  Physical Exam  BP (!) 139/97 (BP Location: Left Arm)   Pulse 63   Temp 98.7 F (37.1 C) (Oral)   Resp 18   SpO2 98%  Gen:   Awake, no distress   Resp:  Normal effort  MSK:   Moves extremities without difficulty  Other:    Medical Decision Making  Medically screening exam initiated at 6:08 PM.  Appropriate orders placed.  Burnell Mauthe was informed that the remainder of the evaluation will be completed by another provider, this initial triage assessment does not replace that evaluation, and the importance of remaining in the ED until their evaluation is complete.  Workup initiated   Su Monks, PA-C 10/17/22 1810

## 2022-10-23 ENCOUNTER — Ambulatory Visit: Payer: MEDICAID | Attending: Psychiatry | Admitting: Physical Therapy

## 2022-10-29 NOTE — ED Provider Notes (Signed)
[place] EMERGENCY DEPARTMENT AT [facility] Provider Note   CSN: [id number] Arrival date & time: 10/17/22  1800     History  Chief Complaint  Patient presents with   Loss of Consciousness   Head Injury    [name] [name] is a 47 y.o. female with history of HTN, depression, asthma, CHF, bipolar 1 disorder, PTSD, who presents to the ER complaining of syncopal episode. Hx of same. Coming in from [facility] shelter. Was talking with case worker at the [facility] about her current manic episode. Felt she "came down too fast" after talking about serious emotional matters. Syncopized and fell out of chair, believes she hit her head because she feels a knot on her head. Complaining of bilateral neck pain. Does not believe she had a seizure, has hx of seizures.   Loss of Consciousness Associated symptoms: headaches   Head Injury Associated symptoms: headache and neck pain        Home Medications Prior to Admission medications   Medication Sig Start Date End Date Taking? Authorizing Provider  albuterol (PROVENTIL HFA;VENTOLIN HFA) 108 (90 BASE) MCG/ACT inhaler Inhale 2 puffs into the lungs every 6 (six) hours as needed for wheezing or shortness of breath (SOB).    [provider]  alum & mag hydroxide-simeth (MAALOX PLUS) 400-400-40 MG/5ML suspension Take 15 mLs by mouth every 6 (six) hours as needed for indigestion. 10/06/22   Lonell Grandchild, MD  ARIPiprazole (ABILIFY) 15 MG tablet Take 1 tablet (15 mg total) by mouth daily for 14 days. 10/02/22 10/16/22  Lauree Chandler, NP  atorvastatin (LIPITOR) 40 MG tablet Take 1 tablet (40 mg total) by mouth daily for 14 days. 10/01/22 10/15/22  Lauree Chandler, NP  EPINEPHrine 0.3 mg/0.3 mL IJ SOAJ injection Inject 0.3 mg into the muscle as needed for anaphylaxis. 08/19/18   [provider]  FLUoxetine (PROZAC) 40 MG capsule Take 1 capsule (40 mg total) by mouth daily for 14 days. 10/02/22 10/16/22  Lauree Chandler, NP   fluticasone-salmeterol (ADVAIR) 100-50 MCG/ACT AEPB Inhale 1 puff into the lungs 2 (two) times daily. 08/23/22 08/23/23  [provider]  gabapentin (NEURONTIN) 300 MG capsule Take 2 capsules (600 mg total) by mouth 3 (three) times daily for 14 days. 10/01/22 10/15/22  Lauree Chandler, NP  ibuprofen (ADVIL) 800 MG tablet Take 1 tablet (800 mg total) by mouth every 8 (eight) hours as needed for moderate pain. 10/01/22   Arnetha Courser, MD  pantoprazole (PROTONIX) 40 MG tablet Take 1 tablet (40 mg total) by mouth daily for 14 days. 10/02/22 10/16/22  Lauree Chandler, NP  prazosin (MINIPRESS) 5 MG capsule Take 1 capsule (5 mg total) by mouth at bedtime for 14 days. 10/01/22 10/15/22  Lauree Chandler, NP  promethazine (PHENERGAN) 25 MG tablet Take 1 tablet (25 mg total) by mouth every 6 (six) hours as needed for nausea or vomiting. 10/06/22   Lonell Grandchild, MD  topiramate (TOPAMAX) 25 MG tablet Take 1 tablet (25 mg total) by mouth 2 (two) times daily at 8 am and 4 pm for 14 days. 10/01/22 10/15/22  Lauree Chandler, NP  zolpidem (AMBIEN) 5 MG tablet Take 1 tablet (5 mg total) by mouth at bedtime as needed for up to 14 days for sleep. 10/01/22 10/15/22  Lauree Chandler, NP      Allergies    Aspirin, Bee venom, Sulfa antibiotics, and Ultram [tramadol]    Review of Systems   Review  of Systems  Cardiovascular:  Positive for syncope.  Musculoskeletal:  Positive for neck pain.  Neurological:  Positive for syncope and headaches.  All other systems reviewed and are negative.   Physical Exam Updated Vital Signs BP (!) 145/88   Pulse 88   Temp 98.5 F (36.9 C)   Resp 20   SpO2 97%  Physical Exam Vitals and nursing note reviewed.  Constitutional:      Appearance: Normal appearance.  HENT:     Head: Normocephalic and atraumatic.  Eyes:     Conjunctiva/sclera: Conjunctivae normal.  Neck:     Comments: No cervical midline tenderness or deformities Cardiovascular:     Rate and  Rhythm: Normal rate and regular rhythm.  Pulmonary:     Effort: Pulmonary effort is normal. No respiratory distress.     Breath sounds: Normal breath sounds.  Abdominal:     General: There is no distension.     Palpations: Abdomen is soft.     Tenderness: There is no abdominal tenderness.  Skin:    General: Skin is warm and dry.  Neurological:     General: No focal deficit present.     Mental Status: She is alert. Mental status is at baseline.     Comments: Neuro: Speech is clear, able to follow commands. CN III-XII intact grossly intact. PERRLA. EOMI. Sensation intact throughout. Str 5/5 all extremities.      ED Results / Procedures / Treatments   Labs (all labs ordered are listed, but only abnormal results are displayed) Labs Reviewed  BASIC METABOLIC PANEL - Abnormal; Notable for the following components:      Result Value   Potassium 3.2 (*)    All other components within normal limits  URINALYSIS, ROUTINE W REFLEX MICROSCOPIC - Abnormal; Notable for the following components:   Color, Urine COLORLESS (*)    Specific Gravity, Urine 1.002 (*)    All other components within normal limits  CBC  CBG MONITORING, ED    EKG EKG Interpretation Date/Time:  Thursday October 17 2022 20:35:34 EDT Ventricular Rate:  67 PR Interval:  184 QRS Duration:  99 QT Interval:  399 QTC Calculation: 422 R Axis:   92  Text Interpretation: Sinus rhythm Consider left atrial enlargement Borderline right axis deviation Confirmed by Gilda Crease [phone]) on 10/18/2022 6:08:11 PM  Radiology No results found.  Procedures Procedures    Medications Ordered in ED Medications  ondansetron (ZOFRAN-ODT) disintegrating tablet 4 mg (4 mg Oral Given 10/17/22 1907)  acetaminophen (TYLENOL) tablet 1,000 mg (1,000 mg Oral Given 10/17/22 2101)    ED Course/ Medical Decision Making/ A&P                                 Medical Decision Making Amount and/or Complexity of Data  Reviewed Labs: ordered. Radiology: ordered.  Risk OTC drugs. Prescription drug management.  This patient is a 47 y.o. female  who presents to the ED for concern of syncope with head trauma.   Differential diagnoses prior to evaluation: The emergent differential diagnosis includes, but is not limited to,  CVA, ACS, arrhythmia, vasovagal syncope, orthostatic hypotension, sepsis, hypoglycemia, electrolyte disturbance, respiratory failure, symptomatic anemia, dehydration, heat injury, polypharmacy, malignancy, anxiety/panic attack. This is not an exhaustive differential.   Past Medical History / Co-morbidities / Social History: HTN, depression, asthma, CHF, bipolar 1 disorder, PTSD  Additional history: Chart reviewed. Pertinent results include: Reviewed ER visit  note from 9/8 for chest pain and 7/10 for syncope.   Physical Exam: Physical exam performed. The pertinent findings include: Mildly hypertensive, otherwise normal vital signs. Normal neurologic exam as above. No midline tenderness or deformities. No traumatic findings.   Lab Tests/Imaging studies: I personally interpreted labs/imaging and the pertinent results include: CBC and BMP grossly unremarkable, potassium 3.2.  Urinalysis unremarkable.  CT head without acute abnormalities. I agree with the radiologist interpretation.  Cardiac monitoring: EKG obtained and interpreted by myself and attending physician which shows: sinus rhythm   Medications: I ordered medication including tylenol and zofran.  I have reviewed the patients home medicines and have made adjustments as needed.   Disposition: After consideration of the diagnostic results and the patients response to treatment, I feel that emergency department workup does not suggest an emergent condition requiring admission or immediate intervention beyond what has been performed at this time. The plan is: discharge to home with reassurance. No emergent etiology found for today's  episode. Presentation appeared similar to prior. Patient felt episode was brought on by heightened emotions. Had no CP or SOB,  EKG without acute ischemic findings. The patient is safe for discharge and has been instructed to return immediately for worsening symptoms, change in symptoms or any other concerns.  Final Clinical Impression(s) / ED Diagnoses Final diagnoses:  Syncope, unspecified syncope type  Injury of head, initial encounter  Neck pain    Rx / DC Orders ED Discharge Orders     None      Portions of this report may have been transcribed using voice recognition software. Every effort was made to ensure accuracy; however, inadvertent computerized transcription errors may be present.    Jeanella Flattery 10/29/22 2300    Lonell Grandchild, MD 11/05/22 [phone]

## 2022-11-14 ENCOUNTER — Emergency Department (HOSPITAL_COMMUNITY)
Admission: EM | Admit: 2022-11-14 | Discharge: 2022-11-14 | Disposition: A | Discharge status: Home / Self Care | Payer: MEDICAID | Attending: Emergency Medicine | Admitting: Emergency Medicine

## 2022-11-14 ENCOUNTER — Encounter (HOSPITAL_COMMUNITY): Payer: Self-pay

## 2022-11-14 ENCOUNTER — Other Ambulatory Visit: Payer: Self-pay

## 2022-11-14 DIAGNOSIS — I10 Essential (primary) hypertension: Secondary | ICD-10-CM | POA: Diagnosis not present

## 2022-11-14 DIAGNOSIS — Z79899 Other long term (current) drug therapy: Secondary | ICD-10-CM | POA: Diagnosis not present

## 2022-11-14 DIAGNOSIS — R519 Headache, unspecified: Secondary | ICD-10-CM | POA: Diagnosis present

## 2022-11-14 LAB — CBG MONITORING, ED: Glucose-Capillary: 93 mg/dL (ref 70–99)

## 2022-11-14 MED ORDER — ACETAMINOPHEN 325 MG PO TABS
650.0000 mg | ORAL_TABLET | Freq: Once | ORAL | Status: AC
  Administered 2022-11-14: 650 mg via ORAL
  Filled 2022-11-14: qty 2

## 2022-11-14 MED ORDER — ONDANSETRON 4 MG PO TBDP
4.0000 mg | ORAL_TABLET | Freq: Once | ORAL | Status: AC
  Administered 2022-11-14: 4 mg via ORAL
  Filled 2022-11-14: qty 1

## 2022-11-14 NOTE — ED Triage Notes (Signed)
[facility] from [facility] d/t hypoglycemia (63). Ate 2 glucose tablets prior to EMS arrival. Glucose improved 125 for medics. A+Ox4.

## 2022-11-14 NOTE — ED Notes (Signed)
Pt given [place] sandwich, crackers and peanut butter, and sprite.

## 2022-11-14 NOTE — Discharge Instructions (Signed)
Your blood sugar is currently normal limit.  Please resume taking your headache medication or taking over-the-counter Tylenol as needed for your headache.  You may follow-up with your doctor for further care.

## 2022-11-14 NOTE — ED Provider Notes (Signed)
[place] EMERGENCY DEPARTMENT AT [facility] Provider Note   CSN: [id number] Arrival date & time: 11/14/22  8469     History  Chief Complaint  Patient presents with   Hypoglycemia    [name] [name] is a 47 y.o. female.  The history is provided by the patient, the EMS personnel and medical records. No language interpreter was used.  Hypoglycemia    47 year old female with significant history of bipolar disorder, hypertension, PTSD, no history of diabetes, brought here from [facility] via EMS with concerns of low blood sugar.  Patient states she has history of having hypoglycemic episodes.  She checks her blood sugar in the morning on a daily basis and is usually between 70-80s.  She mention if it drops below that number she would pass out.  Manage today it was 24 when she checked in and she immediately took 2 glucose tablets but states that she did passed out before EMS arrived.  EMS did report a CBG of 125 when they arrived.  At this time patient is complaining of a migraine headache and feeling generally weak.  She mentioned this usually happens when her blood sugar is low or if it goes too high.  She requesting for a migraine cocktail.  No recent sickness no other complaint.  Home Medications Prior to Admission medications   Medication Sig Start Date End Date Taking? Authorizing Provider  albuterol (PROVENTIL HFA;VENTOLIN HFA) 108 (90 BASE) MCG/ACT inhaler Inhale 2 puffs into the lungs every 6 (six) hours as needed for wheezing or shortness of breath (SOB).    [provider]  alum & mag hydroxide-simeth (MAALOX PLUS) 400-400-40 MG/5ML suspension Take 15 mLs by mouth every 6 (six) hours as needed for indigestion. 10/06/22   Lonell Grandchild, MD  ARIPiprazole (ABILIFY) 15 MG tablet Take 1 tablet (15 mg total) by mouth daily for 14 days. 10/02/22 10/16/22  Lauree Chandler, NP  atorvastatin (LIPITOR) 40 MG tablet Take 1 tablet (40 mg total) by mouth daily for 14 days.  10/01/22 10/15/22  Lauree Chandler, NP  EPINEPHrine 0.3 mg/0.3 mL IJ SOAJ injection Inject 0.3 mg into the muscle as needed for anaphylaxis. 08/19/18   [provider]  FLUoxetine (PROZAC) 40 MG capsule Take 1 capsule (40 mg total) by mouth daily for 14 days. 10/02/22 10/16/22  Lauree Chandler, NP  fluticasone-salmeterol (ADVAIR) 100-50 MCG/ACT AEPB Inhale 1 puff into the lungs 2 (two) times daily. 08/23/22 08/23/23  [provider]  gabapentin (NEURONTIN) 300 MG capsule Take 2 capsules (600 mg total) by mouth 3 (three) times daily for 14 days. 10/01/22 10/15/22  Lauree Chandler, NP  ibuprofen (ADVIL) 800 MG tablet Take 1 tablet (800 mg total) by mouth every 8 (eight) hours as needed for moderate pain. 10/01/22   Arnetha Courser, MD  pantoprazole (PROTONIX) 40 MG tablet Take 1 tablet (40 mg total) by mouth daily for 14 days. 10/02/22 10/16/22  Lauree Chandler, NP  prazosin (MINIPRESS) 5 MG capsule Take 1 capsule (5 mg total) by mouth at bedtime for 14 days. 10/01/22 10/15/22  Lauree Chandler, NP  promethazine (PHENERGAN) 25 MG tablet Take 1 tablet (25 mg total) by mouth every 6 (six) hours as needed for nausea or vomiting. 10/06/22   Lonell Grandchild, MD  topiramate (TOPAMAX) 25 MG tablet Take 1 tablet (25 mg total) by mouth 2 (two) times daily at 8 am and 4 pm for 14 days. 10/01/22 10/15/22  Lauree Chandler, NP  zolpidem (AMBIEN) 5 MG tablet Take 1 tablet (5 mg total) by mouth at bedtime as needed for up to 14 days for sleep. 10/01/22 10/15/22  Lauree Chandler, NP      Allergies    Aspirin, Bee venom, Sulfa antibiotics, and Ultram [tramadol]    Review of Systems   Review of Systems  All other systems reviewed and are negative.   Physical Exam Updated Vital Signs BP 103/65 (BP Location: Left Arm)   Pulse 88   Temp 98.5 F (36.9 C) (Oral)   Resp 18   SpO2 97%  Physical Exam Vitals and nursing note reviewed.  Constitutional:      General: She is not in acute  distress.    Appearance: She is well-developed. She is obese.     Comments: Patient is laying in bed sleeping comfortably easily arousable and appears to be in no acute discomfort.  HENT:     Head: Atraumatic.  Eyes:     Conjunctiva/sclera: Conjunctivae normal.  Cardiovascular:     Rate and Rhythm: Normal rate and regular rhythm.     Pulses: Normal pulses.     Heart sounds: Normal heart sounds.  Pulmonary:     Effort: Pulmonary effort is normal.  Abdominal:     Palpations: Abdomen is soft.  Musculoskeletal:     Cervical back: Normal range of motion and neck supple. No rigidity.  Skin:    Findings: No rash.  Neurological:     Mental Status: She is alert and oriented to person, place, and time.  Psychiatric:        Mood and Affect: Mood normal.     ED Results / Procedures / Treatments   Labs (all labs ordered are listed, but only abnormal results are displayed) Labs Reviewed  CBG MONITORING, ED    EKG None  Radiology No results found.  Procedures Procedures    Medications Ordered in ED Medications  acetaminophen (TYLENOL) tablet 650 mg (650 mg Oral Given 11/14/22 0818)  ondansetron (ZOFRAN-ODT) disintegrating tablet 4 mg (4 mg Oral Given 11/14/22 0816)    ED Course/ Medical Decision Making/ A&P                                 Medical Decision Making  BP 103/65 (BP Location: Left Arm)   Pulse 88   Temp 98.5 F (36.9 C) (Oral)   Resp 18   Ht 5\' 2"  (1.575 m)   Wt 120 kg   SpO2 97%   BMI 48.39 kg/m   42:93 AM  47 year old female with significant history of bipolar disorder, hypertension, PTSD, no history of diabetes, brought here from [facility] via EMS with concerns of low blood sugar.  Patient states she has history of having hypoglycemic episodes.  She checks her blood sugar in the morning on a daily basis and is usually between 70-80s.  She mention if it drops below that number she would pass out.  Manage today it was 91 when she checked in and she immediately  took 2 glucose tablets but states that she did passed out before EMS arrived.  EMS did report a CBG of 125 when they arrived.  At this time patient is complaining of a migraine headache and feeling generally weak.  She mentioned this usually happens when her blood sugar is low or if it goes too high.  She requesting for a migraine cocktail.  No recent sickness no  other complaint.  On exam, patient is sleeping but easily arousable and answer questions appropriately.  She has food available at her table.  She exhibits no nuchal rigidity concerning for meningitis.  She is mentating appropriately and moving all 4 extremities without difficulty.  Vital signs overall reassuring no fever no hypoxia.  CBG obtained and within normal limit at 93.  At this time no acute emergent medical condition identified.  Tylenol given for headache Zofran given for nausea but otherwise I felt patient is stable to be discharged.  She is not on any oral antihyperglycemic medication.  Patient has been seen in the past for syncopal episode with similar presentation.  Workup overall reassuring from recent EMR review during last visit a month ago.  I felt her symptoms likely psychiatric driven and have low suspicion for medical emergency.  I considered repeat head CT scan, check CBC to rule out anemia, obtain EKG to rule out cardiac arrhythmia but I felt these test have been done recently and it is likely low yield.        Final Clinical Impression(s) / ED Diagnoses Final diagnoses:  Bad headache    Rx / DC Orders ED Discharge Orders     None         Fayrene Helper, PA-C 11/14/22 0836    Terrilee Files, MD 11/15/22 1040

## 2022-12-05 ENCOUNTER — Ambulatory Visit: Payer: MEDICAID | Admitting: Cardiology

## 2022-12-09 ENCOUNTER — Ambulatory Visit: Payer: MEDICAID | Admitting: Internal Medicine

## 2022-12-15 ENCOUNTER — Emergency Department (HOSPITAL_COMMUNITY): Payer: MEDICAID

## 2022-12-15 ENCOUNTER — Emergency Department (HOSPITAL_COMMUNITY)
Admission: EM | Admit: 2022-12-15 | Discharge: 2022-12-15 | Disposition: A | Discharge status: Home / Self Care | Payer: MEDICAID | Attending: Emergency Medicine | Admitting: Emergency Medicine

## 2022-12-15 DIAGNOSIS — W01198A Fall on same level from slipping, tripping and stumbling with subsequent striking against other object, initial encounter: Secondary | ICD-10-CM | POA: Diagnosis not present

## 2022-12-15 DIAGNOSIS — R079 Chest pain, unspecified: Secondary | ICD-10-CM | POA: Diagnosis present

## 2022-12-15 DIAGNOSIS — R519 Headache, unspecified: Secondary | ICD-10-CM | POA: Insufficient documentation

## 2022-12-15 DIAGNOSIS — S139XXA Sprain of joints and ligaments of unspecified parts of neck, initial encounter: Secondary | ICD-10-CM

## 2022-12-15 DIAGNOSIS — R0781 Pleurodynia: Secondary | ICD-10-CM | POA: Insufficient documentation

## 2022-12-15 DIAGNOSIS — M542 Cervicalgia: Secondary | ICD-10-CM | POA: Diagnosis not present

## 2022-12-15 DIAGNOSIS — R55 Syncope and collapse: Secondary | ICD-10-CM | POA: Insufficient documentation

## 2022-12-15 DIAGNOSIS — S0990XA Unspecified injury of head, initial encounter: Secondary | ICD-10-CM

## 2022-12-15 DIAGNOSIS — W19XXXA Unspecified fall, initial encounter: Secondary | ICD-10-CM

## 2022-12-15 LAB — TROPONIN I (HIGH SENSITIVITY): Troponin I (High Sensitivity): 4 ng/L (ref <18)

## 2022-12-15 LAB — BASIC METABOLIC PANEL
Anion gap: 8 (ref 5–15)
BUN: 12 mg/dL (ref 6–20)
CO2: 26 mmol/L (ref 22–32)
Calcium: 9.3 mg/dL (ref 8.9–10.3)
Chloride: 106 mmol/L (ref 98–111)
Creatinine, Ser: 0.64 mg/dL (ref 0.44–1.00)
GFR, Estimated: >60 mL/min (ref >60)
Glucose, Bld: 71 mg/dL (ref 70–99)
Potassium: 3.5 mmol/L (ref 3.5–5.1)
Sodium: 140 mmol/L (ref 135–145)

## 2022-12-15 LAB — CBG MONITORING, ED
Glucose-Capillary: 67 mg/dL — ABNORMAL LOW (ref 70–99)
Glucose-Capillary: 99 mg/dL (ref 70–99)

## 2022-12-15 LAB — CBC
HCT: 44.5 % (ref 36.0–46.0)
Hemoglobin: 14.2 g/dL (ref 12.0–15.0)
MCH: 28.9 pg (ref 26.0–34.0)
MCHC: 31.9 g/dL (ref 30.0–36.0)
MCV: 90.4 fL (ref 80.0–100.0)
Platelets: 259 10*3/uL (ref 150–400)
RBC: 4.92 MIL/uL (ref 3.87–5.11)
RDW: 13.4 % (ref 11.5–15.5)
WBC: 6.7 10*3/uL (ref 4.0–10.5)
nRBC: 0 % (ref 0.0–0.2)

## 2022-12-15 MED ORDER — FENTANYL CITRATE PF 50 MCG/ML IJ SOSY
50.0000 ug | PREFILLED_SYRINGE | Freq: Once | INTRAMUSCULAR | Status: AC
  Administered 2022-12-15: 50 ug via INTRAVENOUS
  Filled 2022-12-15: qty 1

## 2022-12-15 MED ORDER — ONDANSETRON HCL 4 MG/2ML IJ SOLN
4.0000 mg | Freq: Once | INTRAMUSCULAR | Status: AC
  Administered 2022-12-15: 4 mg via INTRAVENOUS
  Filled 2022-12-15: qty 2

## 2022-12-15 MED ORDER — MORPHINE SULFATE (PF) 4 MG/ML IV SOLN
4.0000 mg | Freq: Once | INTRAVENOUS | Status: AC
  Administered 2022-12-15: 4 mg via INTRAVENOUS
  Filled 2022-12-15: qty 1

## 2022-12-15 NOTE — ED Provider Notes (Signed)
[place] EMERGENCY DEPARTMENT AT [facility] Provider Note   CSN: [id number] Arrival date & time: 12/15/22  1242     History  No chief complaint on file.   [name] [name] is a 47 y.o. female with past medical history of bipolar 1, seizures, congestive heart failure, asthma, hypertension presenting to emergency room after a fall at home.  Patient reports she was walking outside when she fell backwards hitting her head on the cement.  Patient is unsure how she fell, she knows she lost consciousness.  Patient does not have any altered mental status, confusion does report headache.  Denies blurry vision or focal weakness.  Patient also reports significant neck tenderness and is in C collar.  Patient reports that prior to her falling she was having 3 days of chest discomfort that referred to her right arm.  Reports she is currently having left-sided chest wall pain, hurts when he takes a deep breath and hurts to the touch.  Patient denies shortness of breath, abdominal pain nausea vomiting diarrhea.  HPI     Home Medications Prior to Admission medications   Medication Sig Start Date End Date Taking? Authorizing Provider  albuterol (PROVENTIL HFA;VENTOLIN HFA) 108 (90 BASE) MCG/ACT inhaler Inhale 2 puffs into the lungs every 6 (six) hours as needed for wheezing or shortness of breath (SOB).    [provider]  alum & mag hydroxide-simeth (MAALOX PLUS) 400-400-40 MG/5ML suspension Take 15 mLs by mouth every 6 (six) hours as needed for indigestion. 10/06/22   [name] Grandchild, MD  ARIPiprazole (ABILIFY) 15 MG tablet Take 1 tablet (15 mg total) by mouth daily for 14 days. 10/02/22 10/16/22  [name] Chandler, NP  atorvastatin (LIPITOR) 40 MG tablet Take 1 tablet (40 mg total) by mouth daily for 14 days. 10/01/22 10/15/22  [name] Chandler, NP  EPINEPHrine 0.3 mg/0.3 mL IJ SOAJ injection Inject 0.3 mg into the muscle as needed for anaphylaxis. 08/19/18   [provider]  FLUoxetine (PROZAC) 40 MG capsule Take 1 capsule (40 mg total) by mouth daily for 14 days. 10/02/22 10/16/22  [name] Chandler, NP  fluticasone-salmeterol (ADVAIR) 100-50 MCG/ACT AEPB Inhale 1 puff into the lungs 2 (two) times daily. 08/23/22 08/23/23  [provider]  gabapentin (NEURONTIN) 300 MG capsule Take 2 capsules (600 mg total) by mouth 3 (three) times daily for 14 days. 10/01/22 10/15/22  [name] Chandler, NP  ibuprofen (ADVIL) 800 MG tablet Take 1 tablet (800 mg total) by mouth every 8 (eight) hours as needed for moderate pain. 10/01/22   Arnetha Courser, MD  pantoprazole (PROTONIX) 40 MG tablet Take 1 tablet (40 mg total) by mouth daily for 14 days. 10/02/22 10/16/22  [name] Chandler, NP  prazosin (MINIPRESS) 5 MG capsule Take 1 capsule (5 mg total) by mouth at bedtime for 14 days. 10/01/22 10/15/22  [name] Chandler, NP  promethazine (PHENERGAN) 25 MG tablet Take 1 tablet (25 mg total) by mouth every 6 (six) hours as needed for nausea or vomiting. 10/06/22   [name] Grandchild, MD  topiramate (TOPAMAX) 25 MG tablet Take 1 tablet (25 mg total) by mouth 2 (two) times daily at 8 am and 4 pm for 14 days. 10/01/22 10/15/22  [name] Chandler, NP  zolpidem (AMBIEN) 5 MG tablet Take 1 tablet (5 mg total) by mouth at bedtime as needed for up to 14 days for sleep. 10/01/22 10/15/22  [name] Chandler, NP      Allergies  Aspirin, Bee venom, Sulfa antibiotics, and Ultram [tramadol]    Review of Systems   Review of Systems  Cardiovascular:  Positive for chest pain.    Physical Exam Updated Vital Signs There were no vitals taken for this visit. Physical Exam Vitals and nursing note reviewed.  Constitutional:      General: She is not in acute distress.    Appearance: She is not ill-appearing, toxic-appearing or diaphoretic.  HENT:     Head: Normocephalic and atraumatic.  Eyes:     General: No scleral icterus.    Conjunctiva/sclera: Conjunctivae normal.  Neck:     Comments:  Difficult to examine secondary to patient body habitus and c-collar.  Patient denies cervical radiculopathy.  Upper extremity neurovascularly intact.  Cardiovascular:     Rate and Rhythm: Regular rhythm. Bradycardia present.     Pulses: Normal pulses.     Heart sounds: Normal heart sounds.  Pulmonary:     Effort: Pulmonary effort is normal. No respiratory distress.     Breath sounds: Normal breath sounds.  Abdominal:     General: Abdomen is flat. Bowel sounds are normal.     Palpations: Abdomen is soft.     Tenderness: There is no abdominal tenderness.  Musculoskeletal:     Right lower leg: No edema.     Left lower leg: No edema.     Comments: Patient does not have unilateral swelling, erythema or pain over calf suggesting DVT.  Skin:    General: Skin is warm and dry.     Findings: No lesion.  Neurological:     General: No focal deficit present.     Mental Status: She is alert and oriented to person, place, and time. Mental status is at baseline.     ED Results / Procedures / Treatments   Labs (all labs ordered are listed, but only abnormal results are displayed) Labs Reviewed  CBG MONITORING, ED - Abnormal; Notable for the following components:      Result Value   Glucose-Capillary 67 (*)    All other components within normal limits  BASIC METABOLIC PANEL  CBC  TROPONIN I (HIGH SENSITIVITY)  TROPONIN I (HIGH SENSITIVITY)    EKG None  Radiology No results found.  Procedures Procedures    Medications Ordered in ED Medications - No data to display  ED Course/ Medical Decision Making/ A&P                                 Medical Decision Making Amount and/or Complexity of Data Reviewed Labs: ordered. Radiology: ordered.  Risk Prescription drug management.   [name] 47 y.o. presented today for chest pain. Working DDx that I considered at this time includes, but not limited to, ACS, GERD, pe, pna, aortic dissection, pneumothorax, MSK path, anemia,  esophageal rupture, CHF exacerbation, valvular disorder, myocarditis, pericarditis, endocarditis, pericardial effusion/cardiac tamponade, pulmonary edema, gastritis/PUD, esophagitis.   PMHX: bipolar 1, seizures, congestive heart failure, asthma, hypertension   Unique Tests and My Interpretation:  EKG: Rate, rhythm, axis, intervals all examined: sinus  Troponin: 4, repeat pending Point-of-care BG 67, patient given orange juice will repeat. CXR with left ribs: PENDING CT of head and neck PENDING  CBC: No elevated white blood cell count, hemoglobin 14.2 BMP: No electrolyte abnormality   Problem List / ED Course / Critical interventions / Medication management  Patient presenting with chest pain, that has been ongoing for 3 days  radiating to her right arm.  Patient has no associated shortness of breath or palpitations. On exam patient does not have unilateral leg swelling suggesting DVT, patient is PERC negative.  No sign of fluid overload suggesting CHF exacerbation.  Patient's chest pain does not refer to back and is not associated with focal deficits thus my suspicion for aortic dissection is low.  Ordered labs including troponin, chest x-ray and imaging of head and neck given patient's physical exam. I ordered medication including fentanyl  Reevaluation of the patient after these medicines showed that the patient improved Patients vitals assessed. Upon arrival patient is hemodynamically stable.  I have reviewed the patients home medicines and have made adjustments as needed    Plan: Patient signed off to oncoming PA. Workup pending imaging of CT head, neck, chest x-ray. If patients pain controlled during stay and without abnormal imaging, I anticipate patient will be stable for discharge with PCP follow up.          Final Clinical Impression(s) / ED Diagnoses Final diagnoses:  Fall, initial encounter  Injury of head, initial encounter  Neck sprain, initial encounter  Chest  pain, unspecified type  Syncope, unspecified syncope type    Rx / DC Orders ED Discharge Orders     None         Smitty Knudsen, PA-C 12/19/22 1530    Royanne Foots, DO 12/20/22 [phone]

## 2022-12-15 NOTE — ED Triage Notes (Signed)
Coming from  [facility] by ems, Pt was ambulating outside with a cane, she felt backwards hitting her head on the concrete floor. She did lose consciousness.  Pt state chest pain for the past 3 days on her right chest that irradiated to right arm.  Alert and oriented on assessment. She was placed on c collar by EMS.

## 2022-12-15 NOTE — ED Notes (Signed)
Patient transported to CT 

## 2022-12-15 NOTE — ED Notes (Signed)
8oz of orange juice given for hypoglycemia, Thayer Jew PA-c notified

## 2022-12-15 NOTE — ED Provider Notes (Signed)
Handoff from J. Barrett PA, pt with 3 days of chest pain radiating to R arm. Had syncopal event today and hit head. C/o head and neck pain and L rib pain. Pending CT head and neck.   Physical Exam  BP (!) 126/92   Pulse (!) 58   Temp 97.7 F (36.5 C) (Oral)   Resp 15   SpO2 100%   Physical Exam Vitals and nursing note reviewed.  Constitutional:      General: She is not in acute distress.    Appearance: She is well-developed.  HENT:     Head: Normocephalic and atraumatic.     Right Ear: Tympanic membrane normal.     Left Ear: Tympanic membrane normal.     Nose: Nose normal.     Mouth/Throat:     Mouth: Mucous membranes are moist.  Eyes:     Conjunctiva/sclera: Conjunctivae normal.  Cardiovascular:     Rate and Rhythm: Normal rate and regular rhythm.     Heart sounds: No murmur heard. Pulmonary:     Effort: Pulmonary effort is normal. No respiratory distress.     Breath sounds: Normal breath sounds.  Abdominal:     Palpations: Abdomen is soft.     Tenderness: There is no abdominal tenderness.     Comments: No right upper quadrant abdominal tenderness, or tenderness throughout the abdomen  Musculoskeletal:        General: No swelling.     Cervical back: Neck supple.     Comments: Left chest wall tenderness to palpation, no evidence of erythema, edema, ecchymosis, of the chest wall.  Skin:    General: Skin is warm and dry.     Capillary Refill: Capillary refill takes less than 2 seconds.  Neurological:     Mental Status: She is alert.  Psychiatric:        Mood and Affect: Mood normal.     Procedures  Procedures  ED Course / MDM    Medical Decision Making Patient cervical CT, as well as head CT unremarkable, chest x-ray, with ribs unremarkable.  No evidence of ecchymosis, crepitus on my exam thus no need to obtain a CT chest.  She is well-appearing, pain is more painful when turning head left and right, I believe this likely secondary to cervical sprain.   Eyes/contusion from the fall.  She has no evidence of any kind of fracture however.  Feeling better after meds.  Blood work is unremarkable troponin within normal limits.  EKG reassuring.  Unclear etiology of the chest pain, however heart score of 3.  Will have her follow-up with the PCP, and return if symptoms worsen.  Flexeril sent to the pharmacy, given cervical sprain, advised to take Tylenol for pain control.  She states that every time she gets up, she is a little bit more dizzy, this is probably secondary to orthostatics, versus vertigo, she states that she has been dealing with this for years, I recommend she follow-up with a PCP in regards to this  Amount and/or Complexity of Data Reviewed Labs: ordered. Radiology: ordered.  Risk Prescription drug management.         Pete Pelt, [place] 12/15/22 1635    Tegeler, Canary Brim, MD 12/15/22 [phone]

## 2022-12-15 NOTE — Discharge Instructions (Addendum)
Your blood work was reassuring today, make sure you are eating regularly, to help prevent low glucose.  Please follow-up with your primary care doctor, if you do not have a primary care doctor, please follow-up with the [facility] of community health and wellness for further evaluation.  Return to the ER if you become lightheaded, dizzy, short of breath, or so has have severe headache, intractable nausea or vomiting.

## 2022-12-22 ENCOUNTER — Other Ambulatory Visit: Payer: Self-pay

## 2022-12-22 ENCOUNTER — Emergency Department (HOSPITAL_COMMUNITY)
Admission: EM | Admit: 2022-12-22 | Discharge: 2022-12-22 | Disposition: A | Discharge status: Home / Self Care | Payer: MEDICAID | Attending: Emergency Medicine | Admitting: Emergency Medicine

## 2022-12-22 ENCOUNTER — Emergency Department (HOSPITAL_COMMUNITY): Payer: MEDICAID

## 2022-12-22 ENCOUNTER — Encounter (HOSPITAL_COMMUNITY): Payer: Self-pay

## 2022-12-22 DIAGNOSIS — R5383 Other fatigue: Secondary | ICD-10-CM | POA: Diagnosis not present

## 2022-12-22 DIAGNOSIS — I110 Hypertensive heart disease with heart failure: Secondary | ICD-10-CM | POA: Insufficient documentation

## 2022-12-22 DIAGNOSIS — Z955 Presence of coronary angioplasty implant and graft: Secondary | ICD-10-CM | POA: Diagnosis not present

## 2022-12-22 DIAGNOSIS — Z79899 Other long term (current) drug therapy: Secondary | ICD-10-CM | POA: Diagnosis not present

## 2022-12-22 DIAGNOSIS — Z91148 Patient's other noncompliance with medication regimen for other reason: Secondary | ICD-10-CM

## 2022-12-22 DIAGNOSIS — I2510 Atherosclerotic heart disease of native coronary artery without angina pectoris: Secondary | ICD-10-CM | POA: Diagnosis not present

## 2022-12-22 DIAGNOSIS — I509 Heart failure, unspecified: Secondary | ICD-10-CM | POA: Insufficient documentation

## 2022-12-22 DIAGNOSIS — J45909 Unspecified asthma, uncomplicated: Secondary | ICD-10-CM | POA: Diagnosis not present

## 2022-12-22 DIAGNOSIS — R0602 Shortness of breath: Secondary | ICD-10-CM | POA: Insufficient documentation

## 2022-12-22 DIAGNOSIS — R531 Weakness: Secondary | ICD-10-CM | POA: Diagnosis not present

## 2022-12-22 DIAGNOSIS — R112 Nausea with vomiting, unspecified: Secondary | ICD-10-CM | POA: Diagnosis not present

## 2022-12-22 DIAGNOSIS — R197 Diarrhea, unspecified: Secondary | ICD-10-CM

## 2022-12-22 DIAGNOSIS — R079 Chest pain, unspecified: Secondary | ICD-10-CM | POA: Diagnosis present

## 2022-12-22 LAB — CBC
HCT: 44.2 % (ref 36.0–46.0)
Hemoglobin: 14.5 g/dL (ref 12.0–15.0)
MCH: 29.4 pg (ref 26.0–34.0)
MCHC: 32.8 g/dL (ref 30.0–36.0)
MCV: 89.5 fL (ref 80.0–100.0)
Platelets: 251 10*3/uL (ref 150–400)
RBC: 4.94 MIL/uL (ref 3.87–5.11)
RDW: 13.4 % (ref 11.5–15.5)
WBC: 7.6 10*3/uL (ref 4.0–10.5)
nRBC: 0 % (ref 0.0–0.2)

## 2022-12-22 LAB — BASIC METABOLIC PANEL
Anion gap: 8 (ref 5–15)
BUN: 11 mg/dL (ref 6–20)
CO2: 25 mmol/L (ref 22–32)
Calcium: 9.1 mg/dL (ref 8.9–10.3)
Chloride: 103 mmol/L (ref 98–111)
Creatinine, Ser: 0.72 mg/dL (ref 0.44–1.00)
GFR, Estimated: >60 mL/min (ref >60)
Glucose, Bld: 90 mg/dL (ref 70–99)
Potassium: 3.8 mmol/L (ref 3.5–5.1)
Sodium: 136 mmol/L (ref 135–145)

## 2022-12-22 LAB — TROPONIN I (HIGH SENSITIVITY)
Troponin I (High Sensitivity): 3 ng/L (ref <18)
Troponin I (High Sensitivity): 3 ng/L (ref <18)

## 2022-12-22 MED ORDER — OXYCODONE HCL 5 MG PO TABS
5.0000 mg | ORAL_TABLET | Freq: Once | ORAL | Status: AC
  Administered 2022-12-22: 5 mg via ORAL
  Filled 2022-12-22: qty 1

## 2022-12-22 MED ORDER — MORPHINE SULFATE (PF) 4 MG/ML IV SOLN
4.0000 mg | Freq: Once | INTRAVENOUS | Status: AC
  Administered 2022-12-22: 4 mg via INTRAMUSCULAR
  Filled 2022-12-22: qty 1

## 2022-12-22 MED ORDER — PROMETHAZINE HCL 25 MG PO TABS
25.0000 mg | ORAL_TABLET | Freq: Once | ORAL | Status: AC
  Administered 2022-12-22: 25 mg via ORAL
  Filled 2022-12-22: qty 1

## 2022-12-22 MED ORDER — ALBUTEROL SULFATE HFA 108 (90 BASE) MCG/ACT IN AERS
2.0000 | INHALATION_SPRAY | Freq: Once | RESPIRATORY_TRACT | Status: AC
  Administered 2022-12-22: 2 via RESPIRATORY_TRACT
  Filled 2022-12-22: qty 6.7

## 2022-12-22 MED ORDER — PROMETHAZINE HCL 25 MG PO TABS: 25.0000 mg | ORAL_TABLET | Freq: Four times a day (QID) | ORAL | 0 refills | Status: DC | PRN

## 2022-12-22 MED ORDER — SODIUM CHLORIDE 0.9 % IV SOLN
12.5000 mg | Freq: Once | INTRAVENOUS | Status: DC
  Filled 2022-12-22: qty 0.5

## 2022-12-22 MED ORDER — FLUTICASONE-SALMETEROL 100-50 MCG/ACT IN AEPB: 1.0000 | INHALATION_SPRAY | Freq: Two times a day (BID) | RESPIRATORY_TRACT | 11 refills | Status: DC

## 2022-12-22 MED ORDER — FENTANYL CITRATE PF 50 MCG/ML IJ SOSY: 50.0000 ug | PREFILLED_SYRINGE | Freq: Once | INTRAMUSCULAR | Status: DC

## 2022-12-22 MED ORDER — IPRATROPIUM-ALBUTEROL 0.5-2.5 (3) MG/3ML IN SOLN
3.0000 mL | Freq: Once | RESPIRATORY_TRACT | Status: AC
  Administered 2022-12-22: 3 mL via RESPIRATORY_TRACT
  Filled 2022-12-22: qty 3

## 2022-12-22 MED ORDER — SODIUM CHLORIDE 0.9 % IV BOLUS
1000.0000 mL | Freq: Once | INTRAVENOUS | Status: AC
  Administered 2022-12-22: 1000 mL via INTRAVENOUS

## 2022-12-22 MED ORDER — LOPERAMIDE HCL 2 MG PO CAPS: 2.0000 mg | ORAL_CAPSULE | Freq: Four times a day (QID) | ORAL | 0 refills | Status: DC | PRN

## 2022-12-22 MED ORDER — ONDANSETRON 4 MG PO TBDP
4.0000 mg | ORAL_TABLET | Freq: Once | ORAL | Status: AC
  Administered 2022-12-22: 4 mg via ORAL
  Filled 2022-12-22: qty 1

## 2022-12-22 NOTE — Discharge Instructions (Signed)
You were seen in the emergency department today for chest pain.  As we discussed your lab work, EKG, chest x-ray all looked reassuring today.  I think that your symptoms could have been related to muscle cramps/dehydration or flare of your asthma.   I recommend monitoring your stress levels.  Continue to monitor how you are doing overall, and return to the emergency department for any new or worsening symptoms such as: Worsening pain or pain with exertion, difficulty breathing, sweating, or pain or swelling in your legs.  I have sent prescriptions for your steroid inhaler, nausea and diarrhea medication.   You have several medications in your chart that will need to be verified before they can be refilled. Please follow up with a primary care provider regarding your visit today and your necessary medications. If you do not have a primary care provider, you may reach out to [facility] at [phone] to establish with one and make your first appointment.

## 2022-12-22 NOTE — ED Triage Notes (Addendum)
[place] ED via [facility] [place]. Pt c/o chest pain that started this am, diarrhea x 4 days,  and had a syncopal episode. Pt  was sitting and talking and does not remember what happened. Pt was laying on ground with EMS arrived. Pt took ntg 5 this am, last one around 0900, before EMS arrival. Pt has also had nausea.   Pt c/o mid-chest pain radiating right arm. Pt states her back is hurting from the fall from the syncope and her head hurts from the ntg.  EMS VS Initial BP 80/palp 124/82 HR 70 02 98% Cbg=100
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
## 2022-12-31 ENCOUNTER — Encounter: Payer: Self-pay | Admitting: Student

## 2022-12-31 ENCOUNTER — Ambulatory Visit: Payer: MEDICAID | Admitting: Student

## 2022-12-31 VITALS — BP 144/102 | HR 69 | Temp 98.6°F | Ht 62.5 in | Wt 360.3 lb

## 2022-12-31 DIAGNOSIS — H60502 Unspecified acute noninfective otitis externa, left ear: Secondary | ICD-10-CM | POA: Diagnosis not present

## 2022-12-31 DIAGNOSIS — F39 Unspecified mood [affective] disorder: Secondary | ICD-10-CM | POA: Diagnosis not present

## 2022-12-31 DIAGNOSIS — J45909 Unspecified asthma, uncomplicated: Secondary | ICD-10-CM

## 2022-12-31 DIAGNOSIS — H6090 Unspecified otitis externa, unspecified ear: Secondary | ICD-10-CM | POA: Insufficient documentation

## 2022-12-31 DIAGNOSIS — F332 Major depressive disorder, recurrent severe without psychotic features: Secondary | ICD-10-CM

## 2022-12-31 DIAGNOSIS — G8929 Other chronic pain: Secondary | ICD-10-CM

## 2022-12-31 DIAGNOSIS — M545 Low back pain, unspecified: Secondary | ICD-10-CM

## 2022-12-31 DIAGNOSIS — F4310 Post-traumatic stress disorder, unspecified: Secondary | ICD-10-CM | POA: Insufficient documentation

## 2022-12-31 MED ORDER — LIDOCAINE-PRILOCAINE 2.5-2.5 % EX CREA: 1.0000 | TOPICAL_CREAM | CUTANEOUS | 0 refills | Status: DC | PRN

## 2022-12-31 MED ORDER — FLUTICASONE-SALMETEROL 100-50 MCG/ACT IN AEPB: 1.0000 | INHALATION_SPRAY | Freq: Two times a day (BID) | RESPIRATORY_TRACT | 11 refills | Status: DC

## 2022-12-31 MED ORDER — PRAZOSIN HCL 5 MG PO CAPS: 5.0000 mg | ORAL_CAPSULE | Freq: Every day | ORAL | 0 refills | Status: DC

## 2022-12-31 MED ORDER — CIPROFLOXACIN-DEXAMETHASONE 0.3-0.1 % OT SUSP: 4.0000 [drp] | Freq: Two times a day (BID) | OTIC | 0 refills | Status: DC

## 2022-12-31 MED ORDER — GABAPENTIN 300 MG PO CAPS: 600.0000 mg | ORAL_CAPSULE | Freq: Three times a day (TID) | ORAL | 0 refills | Status: DC
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
## 2022-12-31 NOTE — Assessment & Plan Note (Signed)
She reports ear itchiness and pain for over a week in her left ear.  She states that she is tried to clean it with a Q-tip and was able to remove a substantial amount of cerumen, but also started bleeding from her ear.  She states that the pain travels down her jawline slightly.  She denies any fever, hearing loss, dizziness, or new drainage from the ear.  On exam, her ear canal is patent and the tympanic membrane appears grossly normal.  There is some irritation and dried blood in the ear canal.  Given her persistent symptoms and itchiness, I have prescribed her Ciprodex drops for concerns of otitis externa.  I have also given her clear instructions to refrain from sticking things in her ears.

## 2022-12-31 NOTE — Patient Instructions (Addendum)
Thank you, [name] for allowing [place] to provide your care today. Today we discussed your medical history, mood symptoms, back pain, and ear pain.  For your ear pain, I have prescribed you eardrops.  You can put 4 drops in your left ear twice daily.  This should help relieve your pain.  Please do not stick anything in your ear including Q-tips.  For your back pain, I am refilling your gabapentin and prescribing lidocaine cream.  You have an appointment coming up with the pain management doctor who should be able to help you with other options for your pain.  For your bipolar disorder and mood symptoms, I have sent a referral to psychiatry.  They will contact you to schedule an appointment.  I have also put in a referral to [facility], our counselor, who will contact you.   For your asthma, I have refilled your Advair.  We will follow up in four weeks to continue to address your other concerns.   FUTURE APPOINTMENTS:  Gastroenterology (01/17/2023 at 12:00 PM) Mar Daring, MD Gastroenterology [address] [place] [address]  Cardiology 01/17/2023 11:00 AM O'Neal, Ronnald Ramp, MD [facility]  [address], [place], [place] [address]  Pain medicine (01/28/2023 at 1:00 PM) Bowlin, Lianne Moris, [place] Physician Assistant [phone] [place] [address]  Referrals ordered today:    Referral Orders         Ambulatory referral to Psychiatry         Ambulatory referral to Integrated Behavioral Health      I have ordered the following medication/changed the following medications:   Stop the following medications: Medications Discontinued During This Encounter  Medication Reason   gabapentin (NEURONTIN) 300 MG capsule Reorder   prazosin (MINIPRESS) 5 MG capsule Reorder   fluticasone-salmeterol (ADVAIR) 100-50 MCG/ACT AEPB Reorder     Start the following medications: Meds ordered this encounter  Medications    fluticasone-salmeterol (ADVAIR) 100-50 MCG/ACT AEPB    Sig: Inhale 1 puff into the lungs 2 (two) times daily.    Dispense:  60 each    Refill:  11   prazosin (MINIPRESS) 5 MG capsule    Sig: Take 1 capsule (5 mg total) by mouth at bedtime for 14 days.    Dispense:  14 capsule    Refill:  0   ciprofloxacin-dexamethasone (CIPRODEX) OTIC suspension    Sig: Place 4 drops into the left ear 2 (two) times daily.    Dispense:  7.5 mL    Refill:  0   lidocaine-prilocaine (EMLA) cream    Sig: Apply 1 Application topically as needed.    Dispense:  30 g    Refill:  0   gabapentin (NEURONTIN) 300 MG capsule    Sig: Take 2 capsules (600 mg total) by mouth 3 (three) times daily for 14 days.    Dispense:  84 capsule    Refill:  0     Follow up: 1 month  We look forward to seeing you next time. Please call our clinic at [phone] if you have any questions or concerns. The best time to call is Monday-Friday from 9am-4pm, but there is someone available 24/7. If after hours or the weekend, call the main hospital number and ask for the Internal Medicine Resident On-Call. If you need medication refills, please notify your pharmacy one week in advance and they will send [place] a request.   Thank you for trusting  me with your care. Wishing you the best!   Annett Fabian, MD [facility]         [name] - preferred! URGENT CARE SERVICES - WALK IN Phone: [phone]  Address: [address] [place], [place] [address]  Hours:Open 24/7, No appointment required.  URGENT CARE SERVICES Assessment:  mental health evaluation, assessing immediate safety concerns and further mental health needs.  Referral: Resources, Connect to community-based mental health treatment, when indicated, including psychotherapy, psychiatry and other specialized behavioral health or substance use disorder services (for those not already in treatment).  Transitional  care: In-person assessment and/or virtual follow up during the patient's transition to connect them with the appropriate outpatient services.  OUTPATIENT SERVICES  Individual Therapy Partial Hospitalization Program (PHP) Substance Abuse Intensive Outpatient Program [facility]) Specialized Intensive Adult Group Therapy Medication Management Peer Living Room

## 2023-01-01 ENCOUNTER — Encounter: Payer: Self-pay | Admitting: Student

## 2023-01-02 NOTE — Progress Notes (Signed)
Internal Medicine Clinic Attending  I was physically present during the key portions of the resident provided service and participated in the medical decision making of patient's management care. I reviewed pertinent patient test results.  The assessment, diagnosis, and plan were formulated together and I agree with the documentation in the resident's note.  Reymundo Poll, MD

## 2023-01-03 ENCOUNTER — Telehealth: Payer: MEDICAID | Admitting: Student

## 2023-01-03 NOTE — Telephone Encounter (Signed)
Pt states the following medication is $167.00 ad she can not afford it and is not covered by Medicaid.  Pt states she needs something because now her ear is bleeding and hurting  really bad. Pt state she is waking up with blood on her pillow.  ciprofloxacin-dexamethasone (CIPRODEX) OTIC suspension    [facility] - [place], [place] - [address]

## 2023-01-03 NOTE — Telephone Encounter (Signed)
Call to pharmacy Medication is also on "backorder" and pharmacy hasn't been able to get for months Pharmacy recommended tobradex drops (made for eyes but can be used in ears). Will send to MD for review.Kingsley Spittle Cassady12/6/202411:36 AM

## 2023-01-03 NOTE — Telephone Encounter (Signed)
Call from pt stating she was seen on Tuesday (by Dr. Versie Starks) for an ear infection and Ciprofloxacin-dexamethasone was prescribed. Cost $165.00; she cannot afford , not covered by Medicaid. Still having drainage w/some blood coming from her ear onto her pillow. Requesting a different medication, Send rx to [facility] on [address]. Thanks

## 2023-01-06 ENCOUNTER — Other Ambulatory Visit: Payer: Self-pay

## 2023-01-06 ENCOUNTER — Other Ambulatory Visit: Payer: Self-pay | Admitting: Student

## 2023-01-06 ENCOUNTER — Emergency Department (HOSPITAL_COMMUNITY)
Admission: EM | Admit: 2023-01-06 | Discharge: 2023-01-06 | Disposition: A | Discharge status: Home / Self Care | Payer: MEDICAID | Attending: Emergency Medicine | Admitting: Emergency Medicine

## 2023-01-06 ENCOUNTER — Emergency Department (HOSPITAL_COMMUNITY): Payer: MEDICAID

## 2023-01-06 ENCOUNTER — Encounter (HOSPITAL_COMMUNITY): Payer: Self-pay

## 2023-01-06 DIAGNOSIS — M791 Myalgia, unspecified site: Secondary | ICD-10-CM | POA: Insufficient documentation

## 2023-01-06 DIAGNOSIS — R5381 Other malaise: Secondary | ICD-10-CM | POA: Insufficient documentation

## 2023-01-06 DIAGNOSIS — M7989 Other specified soft tissue disorders: Secondary | ICD-10-CM | POA: Diagnosis not present

## 2023-01-06 DIAGNOSIS — R519 Headache, unspecified: Secondary | ICD-10-CM | POA: Diagnosis present

## 2023-01-06 LAB — CBC
HCT: 42.9 % (ref 36.0–46.0)
Hemoglobin: 13.9 g/dL (ref 12.0–15.0)
MCH: 29.7 pg (ref 26.0–34.0)
MCHC: 32.4 g/dL (ref 30.0–36.0)
MCV: 91.7 fL (ref 80.0–100.0)
Platelets: 258 10*3/uL (ref 150–400)
RBC: 4.68 MIL/uL (ref 3.87–5.11)
RDW: 13.5 % (ref 11.5–15.5)
WBC: 7.9 10*3/uL (ref 4.0–10.5)
nRBC: 0 % (ref 0.0–0.2)

## 2023-01-06 LAB — BASIC METABOLIC PANEL
Anion gap: 7 (ref 5–15)
BUN: 12 mg/dL (ref 6–20)
CO2: 22 mmol/L (ref 22–32)
Calcium: 8.9 mg/dL (ref 8.9–10.3)
Chloride: 109 mmol/L (ref 98–111)
Creatinine, Ser: 0.59 mg/dL (ref 0.44–1.00)
GFR, Estimated: >60 mL/min (ref >60)
Glucose, Bld: 84 mg/dL (ref 70–99)
Potassium: 3.8 mmol/L (ref 3.5–5.1)
Sodium: 138 mmol/L (ref 135–145)

## 2023-01-06 LAB — PREGNANCY, URINE: Preg Test, Ur: NEGATIVE

## 2023-01-06 MED ORDER — DIPHENHYDRAMINE HCL 50 MG/ML IJ SOLN
50.0000 mg | Freq: Once | INTRAMUSCULAR | Status: AC
  Administered 2023-01-06: 50 mg via INTRAVENOUS
  Filled 2023-01-06: qty 1

## 2023-01-06 MED ORDER — PROCHLORPERAZINE EDISYLATE 10 MG/2ML IJ SOLN
10.0000 mg | Freq: Once | INTRAMUSCULAR | Status: AC
  Administered 2023-01-06: 10 mg via INTRAVENOUS
  Filled 2023-01-06: qty 2

## 2023-01-06 MED ORDER — HYDROCODONE-ACETAMINOPHEN 5-325 MG PO TABS
1.0000 | ORAL_TABLET | Freq: Once | ORAL | Status: AC
Start: 2023-01-06 — End: 2023-01-06
  Administered 2023-01-06: 1 via ORAL
  Filled 2023-01-06: qty 1

## 2023-01-06 MED ORDER — HYDROCORTISONE-ACETIC ACID 1-2 % OT SOLN: 5.0000 [drp] | Freq: Two times a day (BID) | OTIC | 0 refills | Status: DC

## 2023-01-06 MED ORDER — LACTATED RINGERS IV BOLUS: 1000.0000 mL | Freq: Once | INTRAVENOUS | Status: DC

## 2023-01-06 NOTE — ED Notes (Signed)
Patient transported to CT 

## 2023-01-06 NOTE — ED Provider Triage Note (Signed)
Emergency Medicine Provider Triage Evaluation Note  [name] [name] , a 47 y.o. female  was evaluated in triage.  Pt complains of headache. Started a week ago. Located mostly in the forehead and radiates to the back of her head. Endorsing phono and photophobia along with n/v. Also endorsing SI but no plan.   Review of Systems  Positive: See above Negative: See above  Physical Exam  Ht 5' 2.5" (1.588 m)   Wt (!) 163.3 kg   BMI 64.80 kg/m  Gen:   Awake, no distress   Resp:  Normal effort  MSK:   Moves extremities without difficulty  Other:    Medical Decision Making  Medically screening exam initiated at 2:42 PM.  Appropriate orders placed.  [name] [name] was informed that the remainder of the evaluation will be completed by another provider, this initial triage assessment does not replace that evaluation, and the importance of remaining in the ED until their evaluation is complete.  Work up started   Gareth Eagle, PA-C 01/06/23 1443

## 2023-01-06 NOTE — ED Notes (Signed)
Attempted IV placement by primary RN multiple times with no success. IV team consult placed. MD is aware.

## 2023-01-06 NOTE — Progress Notes (Signed)
Received a message from the patient that the ciprodex ear drops for otitis externa were too expensive. I have sent in a prescription for acetic acid-hydrocortisone ear drops instead.

## 2023-01-06 NOTE — ED Triage Notes (Addendum)
Pt states she is has had a migraine headache and dizziness for past 2 days. Pt states she slid hurting her legs and cannot get pain medication until tomorrow. Pt states she also has nausea. Pt took ibuprofen and migraine medication w/o relief. Pt has photophobia.  Pt states her head is hurting so bad the voices in her head are telling her to end it all. Pt states, "the holidays are really bad for me". Pt endorses suicidal ideations, denies homicidal ideations, pt endorses auditory and visual hallucinations. Upon explaining, pt states, "I feel this way all the time, I'm hurting too bad to do something."

## 2023-01-06 NOTE — ED Provider Notes (Signed)
[place] EMERGENCY DEPARTMENT AT [facility] Provider Note   CSN: [id number] Arrival date & time: 01/06/23  1346     History Chief Complaint  Patient presents with   Headache   Psychiatric Evaluation    HPI [name] [name] is a 47 y.o. female presenting for a myriad of symptoms.  Full body malaise and myalgias, leg swelling, fatigue.  Primarily she states she has had a severe migraine since Saturday.  Home medications are effective.  Narcotics at home also ineffective.  She is in no acute distress is resting comfortably with a towel over her head.  States the light makes his symptoms worse. History of similar.  Not the worst headache of her life she states. Denies fevers chills nausea vomiting syncope or shortness of breath.  Endorsed suicidal thoughts to nursing team.  She denies this to me.  Is endorsing auditory hallucinations.  Patient's recorded medical, surgical, social, medication list and allergies were reviewed in the Snapshot window as part of the initial history.   Review of Systems   Review of Systems  Constitutional:  Positive for fatigue.  Musculoskeletal:  Positive for myalgias.  Neurological:  Positive for headaches.    Physical Exam Updated Vital Signs BP 122/79 (BP Location: Left Arm)   Pulse 81   Temp 99.2 F (37.3 C) (Oral)   Resp (!) 23   Ht 5' 2.5" (1.588 m)   Wt (!) 163.3 kg   SpO2 98%   BMI 64.80 kg/m  Physical Exam Vitals and nursing note reviewed.  Constitutional:      General: She is not in acute distress.    Appearance: She is well-developed.  HENT:     Head: Normocephalic and atraumatic.  Eyes:     Conjunctiva/sclera: Conjunctivae normal.  Cardiovascular:     Rate and Rhythm: Normal rate and regular rhythm.     Heart sounds: No murmur heard. Pulmonary:     Effort: Pulmonary effort is normal. No respiratory distress.     Breath sounds: Normal breath sounds.  Abdominal:     General: There is no distension.      Palpations: Abdomen is soft.     Tenderness: There is no abdominal tenderness. There is no right CVA tenderness or left CVA tenderness.  Musculoskeletal:        General: No swelling or tenderness. Normal range of motion.     Cervical back: Neck supple.  Skin:    General: Skin is warm and dry.  Neurological:     General: No focal deficit present.     Mental Status: She is alert and oriented to person, place, and time. Mental status is at baseline.     Cranial Nerves: No cranial nerve deficit.      ED Course/ Medical Decision Making/ A&P    Procedures Procedures   Medications Ordered in ED Medications  HYDROcodone-acetaminophen (NORCO/VICODIN) 5-325 MG per tablet 1 tablet (1 tablet Oral Given 01/06/23 1715)  prochlorperazine (COMPAZINE) injection 10 mg (10 mg Intravenous Given 01/06/23 2016)  diphenhydrAMINE (BENADRYL) injection 50 mg (50 mg Intravenous Given 01/06/23 2016)   Medical Decision Making:   [name] [name] is a 47 y.o. female who presented to the ED today with headache detailed above.    Patient placed on continuous vitals and telemetry monitoring while in ED which was reviewed periodically.   Complete initial physical exam performed, notably the patient  was HDS in NAD.    Reviewed and confirmed nursing documentation for past medical  history, family history, social history.    Initial Assessment:   With the patient's presentation of headache, most likely diagnosis is tension type headaches vs atypical migraines. Other diagnoses were considered including (but not limited to) intracranial mass, intracranial hemorrhage, intracranial infection including meningitis vs encephalitis, GCA, trigeminal neuralgia. These are considered less likely due to history of present illness and physical exam findings.    This is most consistent with an acute life/limb threatening illness complicated by underlying chronic conditions.   Timeline and slow onset is not consistent with SAH/ICH    Age and description of pain is not consistent with GCA   Lack of fever,meningismus is not consistent with meningitis/encephalitis   Initial Plan:  Will initiate treatment with Compazine/Benardryll/NSAIDS/Tylenol for treatment of nonspecific headache  Screening labs including CBC and Metabolic panel to evaluate for infectious or metabolic etiology of disease.  CTH to evaluate for structural IC etiology  Objective evaluation as below reviewed   Initial Study Results:   Laboratory  All laboratory results reviewed without evidence of clinically relevant pathology.     Radiology:  All images reviewed independently. Agree with radiology report at this time.   CT Head Wo Contrast  Final Result       .   Final Assessment and Plan:   On reassessment headache is resolved.  She feels comfortable with discharge outpatient care and management.  Patient discharged without further acute events.  She continues to deny suicidal ideation on reassessments. Disposition:  I have considered need for hospitalization, however, considering all of the above, I believe this patient is stable for discharge at this time.  Patient/family educated about specific return precautions for given chief complaint and symptoms.  Patient/family educated about follow-up with PCP.     Patient/family expressed understanding of return precautions and need for follow-up. Patient spoken to regarding all imaging and laboratory results and appropriate follow up for these results. All education provided in verbal form with additional information in written form. Time was allowed for answering of patient questions. Patient discharged.    Emergency Department Medication Summary:   Medications  HYDROcodone-acetaminophen (NORCO/VICODIN) 5-325 MG per tablet 1 tablet (1 tablet Oral Given 01/06/23 1715)  prochlorperazine (COMPAZINE) injection 10 mg (10 mg Intravenous Given 01/06/23 2016)  diphenhydrAMINE (BENADRYL) injection 50 mg (50  mg Intravenous Given 01/06/23 2016)     Clinical Impression:  1. Acute nonintractable headache, unspecified headache type      Data Unavailable   Final Clinical Impression(s) / ED Diagnoses Final diagnoses:  Acute nonintractable headache, unspecified headache type    Rx / DC Orders ED Discharge Orders     None         Glyn Ade, MD 01/06/23 2211

## 2023-01-13 NOTE — Progress Notes (Signed)
Cardiology Office Note:  .   Date:  01/17/2023  ID:  [name] [name], DOB 08/24/1975, MRN [id number] PCP: Annett Fabian, MD  [facility] Providers Cardiologist:  None { History of Present Illness: Marland Kitchen   [name] [name] is a 47 y.o. female with history of bipolar disorder who presents for the evaluation of CAD at the request of Annett Fabian, MD.  History of Present Illness   The patient, with a history of CAD, CHF, and an enlarged heart, presents with sharp chest pain that radiates to the back. The pain is non-exertional and occurs randomly. She had a stent placed at [name], but has not had it checked in years. She also reports a history of one major heart attack and one major stroke, followed by two minor strokes. 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 She has been told she has CHF and an enlarged heart for years. She is as active as she can be, but is overweight and walks with a cane due to a history of back injuries and paralysis. She also reports a history of gastric ulcers. She is currently only taking gabapentin, ear drops, and arthritis medication as all other medications were stolen and her insurance had lapsed. She reports no tobacco use. She is no longer drinking. No SOB reported.          Problem List Bipolar disorder/Schizophrenia/PTSD HTN Seizures  CAD -MI at 27 5. HFpEF 6. Obesity  -BMI 64 7.Homelessness  8. Substance abuse      ROS: All other ROS reviewed and negative. Pertinent positives noted in the HPI.     Studies Reviewed: Marland Kitchen   EKG Interpretation Date/Time:  Friday January 17 2023 10:57:18 EST Ventricular Rate:  66 PR Interval:  162 QRS Duration:  82 QT Interval:  390 QTC Calculation: 408 R Axis:   43  Text Interpretation: Normal sinus rhythm Poor R-wave progression Confirmed by Lennie Odor [phone]) on 01/17/2023 10:58:17 AM    TTE 11/09/2018 SUMMARY  The left ventricular size is normal.  There is normal left ventricular wall  thickness.   Left ventricular systolic function is normal.  LV ejection fraction = 60-65%.  No segmental wall motion abnormalities seen in the left ventricle  The right ventricle is normal in size and function.  The right atrium is mildly dilated.  No significant valvular stenosis or regurgitation.  The aortic sinus is normal size.  There is no pericardial effusion.  There is no comparison study available.   Stress Echo 12/11/2018 SUMMARY  The pateint had 2/10 resting chest discomfort at baseline. The patient had  8/10 chest pain during stress. Chest pain was resolved by 14:45 recovery.  The patient achieved 91 % of maximum predicted heart rate.  Negative stress ECG for inducible ischemia at target heart rate.  Normal left ventricular function at rest.  The estimated LV ejection fraction is 60-65% .  There was normal augmentation of all left ventricular wall segments with  dobutamine.  The estimated LV ejection fraction is >70% with stress.  Negative dobutamine echocardiography for inducible ischemia at target heart  rate.  Physical Exam:   VS:  BP (!) 128/94   Pulse 67   Ht 5' 2.5" (1.588 m)   Wt (!) 360 lb (163.3 kg)   SpO2 93%   BMI 64.80 kg/m    Wt Readings from Last 3 Encounters:  01/17/23 (!) 360 lb (163.3 kg)  01/06/23 (!) 360 lb (163.3 kg)  12/31/22 Marland Kitchen)  360 lb 4.8 oz (163.4 kg)    GEN: Well nourished, well developed in no acute distress NECK: No JVD; No carotid bruits CARDIAC: RRR, no murmurs, rubs, gallops RESPIRATORY:  Clear to auscultation without rales, wheezing or rhonchi  ABDOMEN: Soft, non-tender, non-distended EXTREMITIES:  No edema; No deformity  ASSESSMENT AND PLAN: .   Assessment and Plan    Coronary Artery Disease (CAD) History of stent placement at [name] 17, now 75. No recent cardiac evaluations. Reports intermittent sharp chest discomfort, non-exertional, radiating to back, likely related to acid reflux. -Order echocardiogram to update cardiac  status. -Resume Aspirin 81mg  daily. -Resume Lipitor 40mg  daily.  Gastroesophageal Reflux Disease (GERD) History of gastric ulcers. Reports sharp chest discomfort likely related to acid reflux. -Resume Protonix 40mg  daily.  Congestive Heart Failure (CHF) Reports history of CHF and enlarged heart. No recent cardiac evaluations. -no signs of decompensation today.  -Order echocardiogram to update cardiac status.  Follow-up  -Follow-up in 6 months or sooner if needed.              Follow-up: Return in about 6 months (around 07/18/2023).  Signed, Lenna Gilford. Flora Lipps, MD, [facility]  [facility]  [address], [address] [place], [place] [address] [phone]  11:29 AM

## 2023-01-17 ENCOUNTER — Encounter: Payer: Self-pay | Admitting: Cardiovascular Disease

## 2023-01-17 ENCOUNTER — Ambulatory Visit: Payer: MEDICAID | Attending: Cardiovascular Disease | Admitting: Cardiovascular Disease

## 2023-01-17 VITALS — BP 128/94 | HR 67 | Ht 62.5 in | Wt 360.0 lb

## 2023-01-17 DIAGNOSIS — I509 Heart failure, unspecified: Secondary | ICD-10-CM

## 2023-01-17 DIAGNOSIS — K219 Gastro-esophageal reflux disease without esophagitis: Secondary | ICD-10-CM

## 2023-01-17 DIAGNOSIS — I10 Essential (primary) hypertension: Secondary | ICD-10-CM

## 2023-01-17 DIAGNOSIS — I2510 Atherosclerotic heart disease of native coronary artery without angina pectoris: Secondary | ICD-10-CM | POA: Diagnosis not present

## 2023-01-17 MED ORDER — PANTOPRAZOLE SODIUM 40 MG PO TBEC: 40.0000 mg | DELAYED_RELEASE_TABLET | Freq: Every day | ORAL | 3 refills | Status: AC

## 2023-01-17 MED ORDER — PANTOPRAZOLE SODIUM 40 MG PO TBEC: 40.0000 mg | DELAYED_RELEASE_TABLET | Freq: Every day | ORAL | 0 refills | Status: DC

## 2023-01-17 MED ORDER — ATORVASTATIN CALCIUM 40 MG PO TABS: 40.0000 mg | ORAL_TABLET | Freq: Every day | ORAL | 3 refills | Status: AC

## 2023-01-17 MED ORDER — ATORVASTATIN CALCIUM 40 MG PO TABS: 40.0000 mg | ORAL_TABLET | Freq: Every day | ORAL | 0 refills | Status: DC

## 2023-01-17 NOTE — Patient Instructions (Signed)
Medication Instructions:  - START Aspirin 81mg  daily   Your physician recommends that you continue on your current medications as directed. Please refer to the Current Medication list given to you today.    *If you need a refill on your cardiac medications before your next appointment, please call your pharmacy*   Lab Work: None    If you have labs (blood work) drawn today and your tests are completely normal, you will receive your results only by: MyChart Message (if you have MyChart) OR A paper copy in the mail If you have any lab test that is abnormal or we need to change your treatment, we will call you to review the results.   Testing/Procedures: Echo will be scheduled at [address].  Your physician has requested that you have an echocardiogram. Echocardiography is a painless test that uses sound waves to create images of your heart. It provides your doctor with information about the size and shape of your heart and how well your heart's chambers and valves are working. This procedure takes approximately one hour. There are no restrictions for this procedure. Please do NOT wear cologne, perfume, aftershave, or lotions (deodorant is allowed). Please arrive 15 minutes prior to your appointment time.    Follow-Up: At [facility], you and your health needs are our priority.  As part of our continuing mission to provide you with exceptional heart care, we have created designated Provider Care Teams.  These Care Teams include your primary Cardiologist (physician) and Advanced Practice Providers (APPs -  Physician Assistants and Nurse Practitioners) who all work together to provide you with the care you need, when you need it.  We recommend signing up for the patient portal called "MyChart".  Sign up information is provided on this After Visit Summary.  MyChart is used to connect with patients for Virtual Visits (Telemedicine).  Patients are able to view lab/test  results, encounter notes, upcoming appointments, etc.  Non-urgent messages can be sent to your provider as well.   To learn more about what you can do with MyChart, go to [email or web address].    Your next appointment:   6 month(s)  The format for your next appointment:   In Person  Provider:   Edd Fabian, FNP, Micah Flesher, PA-C, Marjie Skiff, PA-C, Robet Leu, PA-C, Juanda Crumble, PA-C, Joni Reining, DNP, ANP, Azalee Course, PA-C, Bernadene Person, NP, or Reather Littler, NP       Other Instructions

## 2023-02-06 ENCOUNTER — Ambulatory Visit (INDEPENDENT_AMBULATORY_CARE_PROVIDER_SITE_OTHER): Payer: MEDICAID | Admitting: Licensed Clinical Social Worker

## 2023-02-06 DIAGNOSIS — F332 Major depressive disorder, recurrent severe without psychotic features: Secondary | ICD-10-CM

## 2023-02-11 NOTE — BH Specialist Note (Signed)
 Integrated Behavioral Health via Telemedicine Visit  02/11/2023 [name] [name] [id number]  Number of Integrated Behavioral Health Clinician visits: 1- Initial Visit  Session Start time: 1530   Session End time: 1600  Total time in minutes: 30   Referring Provider: PCP Patient/Family location: Home [facility] Provider location: Office All persons participating in visit: [facility] and Patient Types of Service: Telephone visit  I connected with [name] [name] via  Telephone  and verified that I am speaking with the correct person using two identifiers. Discussed confidentiality: Yes   I discussed the limitations of telemedicine and the availability of in person appointments.  Discussed there is a possibility of technology failure and discussed alternative modes of communication if that failure occurs.  I discussed that engaging in this telemedicine visit, they consent to the provision of behavioral healthcare and the services will be billed under their insurance.  Patient and/or legal guardian expressed understanding and consented to Telemedicine visit: Yes   Presenting Concerns: Patient and/or family reports the following symptoms/concerns: The Licensed Clinical Engineer, [employer] (LCSW-A), acting as a Visual Merchand[employer] [facility]), initiated a session with patient. The [facility] introduced themselves, explained her role, and provided contact information to the patient. Confidentiality and mandated reporting were discussed, and the patient denied any suicidal ideations or intent to harm others. The Integrated Behavioral Health (IBH) approach was reviewed, and a PHQ-9 assessment was completed.    Standardized Assessments completed: PHQ-SADS    12/31/2022    4:01 PM  PHQ-SADS Last 3 Score only  Total GAD-7 Score 21  PHQ Adolescent Score 19     Patient and/or Family Response: Patient agreed to services  Assessment: Patient currently experiencing Depression.   Patient may  benefit from ongoing therapy.  Plan: Follow up with behavioral health clinician on : Patient will schedule within the next 60 days.   I discussed the assessment and treatment plan with the patient and/or parent/guardian. They were provided an opportunity to ask questions and all were answered. They agreed with the plan and demonstrated an understanding of the instructions.   They were advised to call back or seek an in-person evaluation if the symptoms worsen or if the condition fails to improve as anticipated.  Renda Pontes, MSW, LCSW-A She/Her Behavioral Health Clinician [facility]  Internal Medicine Center Direct [phone]  Fax [phone] Main Office Phone: [phone] [address] [place] [place], [place] [address] Website: [facility]  [place], [place]  

## 2023-02-23 ENCOUNTER — Emergency Department (HOSPITAL_COMMUNITY)
Admission: EM | Admit: 2023-02-23 | Discharge: 2023-02-23 | Disposition: A | Discharge status: Home / Self Care | Payer: MEDICAID | Attending: Emergency Medicine | Admitting: Emergency Medicine

## 2023-02-23 ENCOUNTER — Emergency Department (HOSPITAL_COMMUNITY): Payer: MEDICAID

## 2023-02-23 ENCOUNTER — Other Ambulatory Visit: Payer: Self-pay

## 2023-02-23 ENCOUNTER — Encounter (HOSPITAL_COMMUNITY): Payer: Self-pay

## 2023-02-23 DIAGNOSIS — W010XXA Fall on same level from slipping, tripping and stumbling without subsequent striking against object, initial encounter: Secondary | ICD-10-CM | POA: Diagnosis not present

## 2023-02-23 DIAGNOSIS — S300XXA Contusion of lower back and pelvis, initial encounter: Secondary | ICD-10-CM | POA: Diagnosis not present

## 2023-02-23 DIAGNOSIS — S3992XA Unspecified injury of lower back, initial encounter: Secondary | ICD-10-CM | POA: Diagnosis present

## 2023-02-23 MED ORDER — METHOCARBAMOL 500 MG PO TABS: 500.0000 mg | ORAL_TABLET | Freq: Two times a day (BID) | ORAL | 0 refills | Status: DC

## 2023-02-23 MED ORDER — OXYCODONE-ACETAMINOPHEN 5-325 MG PO TABS
1.0000 | ORAL_TABLET | Freq: Once | ORAL | Status: AC
  Administered 2023-02-23: 1 via ORAL
  Filled 2023-02-23: qty 1

## 2023-02-23 MED ORDER — METHOCARBAMOL 500 MG PO TABS
500.0000 mg | ORAL_TABLET | Freq: Once | ORAL | Status: AC
  Administered 2023-02-23: 500 mg via ORAL
  Filled 2023-02-23: qty 1

## 2023-02-23 MED ORDER — NAPROXEN 250 MG PO TABS
500.0000 mg | ORAL_TABLET | Freq: Once | ORAL | Status: AC
  Administered 2023-02-23: 500 mg via ORAL
  Filled 2023-02-23: qty 2

## 2023-02-23 MED ORDER — IBUPROFEN 600 MG PO TABS: 600.0000 mg | ORAL_TABLET | Freq: Four times a day (QID) | ORAL | 0 refills | Status: DC | PRN

## 2023-02-23 MED ORDER — OXYCODONE-ACETAMINOPHEN 5-325 MG PO TABS: 1.0000 | ORAL_TABLET | Freq: Three times a day (TID) | ORAL | 0 refills | Status: DC | PRN

## 2023-02-23 NOTE — ED Provider Notes (Signed)
[place] EMERGENCY DEPARTMENT AT [facility] Provider Note   CSN: [id number] Arrival date & time: 02/23/23  1405     History  Chief Complaint  Patient presents with   Tailbone Pain    [name] [name] is a 48 y.o. female.  HPI    48 year old female comes in with chief complaint of mechanical fall.  Patient has history of chronic back pain.  She states that she just had shots 2 or 3 days ago.  Today she was coming down the charge bus, when she slipped and fell.  In the process she struck her back onto the stairs of the church bus.  She has severe pain over the lower back. Pt has no associated numbness, weakness, urinary incontinence, urinary retention, bowel incontinence, pins and needle sensation in the perineal area.   Home Medications Prior to Admission medications   Medication Sig Start Date End Date Taking? Authorizing Provider  ibuprofen (ADVIL) 600 MG tablet Take 1 tablet (600 mg total) by mouth every 6 (six) hours as needed. 02/23/23  Yes Derwood Kaplan, MD  methocarbamol (ROBAXIN) 500 MG tablet Take 1 tablet (500 mg total) by mouth 2 (two) times daily. 02/23/23  Yes Derwood Kaplan, MD  oxyCODONE-acetaminophen (PERCOCET/ROXICET) 5-325 MG tablet Take 1 tablet by mouth every 8 (eight) hours as needed for severe pain (pain score 7-10). 02/23/23  Yes Derwood Kaplan, MD  acetic acid-hydrocortisone (VOSOL-HC) OTIC solution Place 5 drops into the left ear 2 (two) times daily. Insert a wick of cotton into the ear canal saturated with solution; keep moist by adding 3 to 5 drops into ear canal every 4 to 6 hours for 24 hours; may remove wick after 24 hours, continue to instill 5 drops into ear canal 3 or 4 times daily for as long as indicated 01/06/23   Annett Fabian, MD  albuterol (PROVENTIL HFA;VENTOLIN HFA) 108 (90 BASE) MCG/ACT inhaler Inhale 2 puffs into the lungs every 6 (six) hours as needed for wheezing or shortness of breath (SOB).    [provider]  alum  & mag hydroxide-simeth (MAALOX PLUS) 400-400-40 MG/5ML suspension Take 15 mLs by mouth every 6 (six) hours as needed for indigestion. 10/06/22   Lonell Grandchild, MD  ARIPiprazole (ABILIFY) 15 MG tablet Take 1 tablet (15 mg total) by mouth daily for 14 days. 10/02/22 10/16/22  Lauree Chandler, NP  atorvastatin (LIPITOR) 40 MG tablet Take 1 tablet (40 mg total) by mouth daily. 01/17/23   O'NealRonnald Ramp, MD  EPINEPHrine 0.3 mg/0.3 mL IJ SOAJ injection Inject 0.3 mg into the muscle as needed for anaphylaxis. Patient not taking: Reported on 01/17/2023 08/19/18   [provider]  FLUoxetine (PROZAC) 40 MG capsule Take 1 capsule (40 mg total) by mouth daily for 14 days. 10/02/22 10/16/22  Lauree Chandler, NP  fluticasone-salmeterol (ADVAIR) 100-50 MCG/ACT AEPB Inhale 1 puff into the lungs 2 (two) times daily. Patient not taking: Reported on 01/17/2023 12/31/22 12/31/23  Annett Fabian, MD  gabapentin (NEURONTIN) 300 MG capsule Take 2 capsules (600 mg total) by mouth 3 (three) times daily for 14 days. 12/31/22 01/14/23  Annett Fabian, MD  lidocaine-prilocaine (EMLA) cream Apply 1 Application topically as needed. Patient not taking: Reported on 01/17/2023 12/31/22   Annett Fabian, MD  loperamide (IMODIUM) 2 MG capsule Take 1 capsule (2 mg total) by mouth 4 (four) times daily as needed for diarrhea or loose stools. Patient not taking: Reported on 01/17/2023 12/22/22   Roemhildt, Lorin T,  PA-C  pantoprazole (PROTONIX) 40 MG tablet Take 1 tablet (40 mg total) by mouth daily. 01/17/23   O'NealRonnald Ramp, MD  prazosin (MINIPRESS) 5 MG capsule Take 1 capsule (5 mg total) by mouth at bedtime for 14 days. 12/31/22 01/14/23  Annett Fabian, MD  promethazine (PHENERGAN) 25 MG tablet Take 1 tablet (25 mg total) by mouth every 6 (six) hours as needed for nausea or vomiting. Patient not taking: Reported on 01/17/2023 12/22/22   Roemhildt, Lorin T, PA-C  topiramate (TOPAMAX) 25 MG tablet Take 1  tablet (25 mg total) by mouth 2 (two) times daily at 8 am and 4 pm for 14 days. 10/01/22 01/06/23  Lauree Chandler, NP  zolpidem (AMBIEN) 5 MG tablet Take 1 tablet (5 mg total) by mouth at bedtime as needed for up to 14 days for sleep. 10/01/22 01/06/23  Lauree Chandler, NP      Allergies    Bee venom, Sulfa antibiotics, and Ultram [tramadol]    Review of Systems   Review of Systems  All other systems reviewed and are negative.   Physical Exam Updated Vital Signs BP (!) 128/108   Pulse 65   Temp 98.3 F (36.8 C) (Oral)   Resp (!) 22   Ht 5' 2.5" (1.588 m)   Wt (!) 163.3 kg   SpO2 97%   BMI 64.80 kg/m  Physical Exam Vitals and nursing note reviewed.  Constitutional:      Appearance: She is well-developed.  HENT:     Head: Atraumatic.  Neck:     Comments: No midline c-spine tenderness, pt able to turn head to 45 degrees bilaterally without any pain and able to flex neck to the chest and extend without any pain or neurologic symptoms.  Cardiovascular:     Rate and Rhythm: Normal rate.  Pulmonary:     Effort: Pulmonary effort is normal.  Musculoskeletal:     Cervical back: Normal range of motion and neck supple.     Comments: Pt has tenderness over the lumbar region and coccyx region No step offs, no erythema. Pt has 2+ patellar reflex bilaterally. Able to discriminate between sharp and dull.    Skin:    General: Skin is warm and dry.  Neurological:     Mental Status: She is alert and oriented to person, place, and time.     ED Results / Procedures / Treatments   Labs (all labs ordered are listed, but only abnormal results are displayed) Labs Reviewed - No data to display  EKG None  Radiology DG Lumbar Spine Complete Result Date: 02/23/2023 CLINICAL DATA:  fall.  Bilateral hip and tailbone pain. EXAM: LUMBAR SPINE - COMPLETE 4+ VIEW; SACRUM AND COCCYX - 2+ VIEW COMPARISON:  None Available. FINDINGS: There are 5 nonrib-bearing lumbar vertebrae. Anatomic  lumbar curvature. No spondylolysis or spondylolisthesis. Vertebral body heights are maintained. No aggressive osseous lesion. Mild multilevel degenerative changes in the form of facet arthropathy and marginal osteophyte formation. Normal and symmetric bilateral sacroiliac joints. Visualized sacral arcuate lines are intact. Visualized soft tissues are within normal limits. IMPRESSION: *No acute osseous abnormality of the lumbar spine or sacrum/coccyx. *Mild multilevel degenerative changes. Electronically Signed   By: Jules Schick M.D.   On: 02/23/2023 15:41   DG Sacrum/Coccyx Result Date: 02/23/2023 CLINICAL DATA:  fall.  Bilateral hip and tailbone pain. EXAM: LUMBAR SPINE - COMPLETE 4+ VIEW; SACRUM AND COCCYX - 2+ VIEW COMPARISON:  None Available. FINDINGS: There are 5 nonrib-bearing lumbar vertebrae.  Anatomic lumbar curvature. No spondylolysis or spondylolisthesis. Vertebral body heights are maintained. No aggressive osseous lesion. Mild multilevel degenerative changes in the form of facet arthropathy and marginal osteophyte formation. Normal and symmetric bilateral sacroiliac joints. Visualized sacral arcuate lines are intact. Visualized soft tissues are within normal limits. IMPRESSION: *No acute osseous abnormality of the lumbar spine or sacrum/coccyx. *Mild multilevel degenerative changes. Electronically Signed   By: Jules Schick M.D.   On: 02/23/2023 15:41    Procedures Procedures    Medications Ordered in ED Medications  oxyCODONE-acetaminophen (PERCOCET/ROXICET) 5-325 MG per tablet 1 tablet (1 tablet Oral Given 02/23/23 1436)  methocarbamol (ROBAXIN) tablet 500 mg (500 mg Oral Given 02/23/23 1436)  naproxen (NAPROSYN) tablet 500 mg (500 mg Oral Given 02/23/23 1436)    ED Course/ Medical Decision Making/ A&P                                 Medical Decision Making Amount and/or Complexity of Data Reviewed Radiology: ordered.  Risk Prescription drug management.   48 year old  patient comes in after sustaining what appears to be a mechanical fall. Pertinent past medical includes chronic back pain Collateral history provided by EMS.  Patient denies any direct trauma to the head.  No headaches, neck pain and no red flag suggesting elevated ICP or neck injury.  I feel comfortable clearing the brain and C-spine clinically.  Based on my history and exam, differential diagnosis includes: -Hematoma - Contusions - Soft tissue injury   Based on the initial assessment, the following workup was initiated x-ray of the lumbar spine and coccyx.  I have independently interpreted the following imaging from the perspective of acute trauma: X-ray of the back and the results indicate no evidence of fracture.  The patient appears reasonably screened and/or stabilized for discharge and I doubt any other medical condition or other [facility] requiring further screening, evaluation, or treatment in the ED at this time prior to discharge.   Results from the ER workup discussed with the patient face to face and all questions answered to the best of my ability. The patient is safe for discharge with strict return precautions.      Final Clinical Impression(s) / ED Diagnoses Final diagnoses:  Contusion of coccyx, initial encounter    Rx / DC Orders ED Discharge Orders          Ordered    oxyCODONE-acetaminophen (PERCOCET/ROXICET) 5-325 MG tablet  Every 8 hours PRN        02/23/23 1639    ibuprofen (ADVIL) 600 MG tablet  Every 6 hours PRN        02/23/23 1639    methocarbamol (ROBAXIN) 500 MG tablet  2 times daily        02/23/23 1639              Derwood Kaplan, MD 02/23/23 1725

## 2023-02-23 NOTE — ED Provider Triage Note (Signed)
Emergency Medicine Provider Triage Evaluation Note  Neylan YAELIS SCHARFENBERG , a 48 y.o. female  was evaluated in triage.  Pt complains of fall and pain over her tailbone.  Review of Systems  Positive: Back pain Negative: Chest pain headache  Physical Exam  BP (!) 128/108   Pulse 65   Temp 98.3 F (36.8 C) (Oral)   Resp (!) 22   Ht 5' 2.5" (1.588 m)   Wt (!) 163.3 kg   SpO2 97%   BMI 64.80 kg/m  Gen:   Awake, no distress   Resp:  Normal effort  MSK:   Moves extremities without difficulty  Other:  Tenderness to palpation over the lower lumbar and sacral region  Medical Decision Making  Medically screening exam initiated at 2:17 PM.  Appropriate orders placed.  Phillippa JADEE GOLEBIEWSKI was informed that the remainder of the evaluation will be completed by another provider, this initial triage assessment does not replace that evaluation, and the importance of remaining in the ED until their evaluation is complete.  Appropriate x-rays ordered. Pain medications ordered.   Derwood Kaplan, MD 02/23/23 1419

## 2023-02-23 NOTE — ED Triage Notes (Signed)
[place] [facility] [place]. Pt states she was getting out of church van and slipped and fell onto buttocks on steps and hit tailbone. Pt c/o tailbone pain and bilat hip pain. Pt has chronic hip pain and got injections yesterday for it and was told to go come here if the pain didn't go away. Pt is able to stand and pivot from EMS stretcher to [facility].

## 2023-02-23 NOTE — Discharge Instructions (Signed)
Xrays are normal. Please apply ice, take the medicines provided and continue to ambulate as much as you can tolerate. After 24 hours, you may start apply warm compresses.

## 2023-02-25 ENCOUNTER — Encounter (HOSPITAL_COMMUNITY): Payer: Self-pay | Admitting: Cardiovascular Disease

## 2023-02-25 ENCOUNTER — Ambulatory Visit (HOSPITAL_COMMUNITY)
Admission: RE | Admit: 2023-02-25 | Payer: MEDICAID | Source: Ambulatory Visit | Attending: Cardiovascular Disease | Admitting: Cardiovascular Disease

## 2023-02-28 ENCOUNTER — Emergency Department (HOSPITAL_COMMUNITY): Payer: MEDICAID

## 2023-02-28 ENCOUNTER — Inpatient Hospital Stay (HOSPITAL_COMMUNITY)
Admission: EM | Admit: 2023-02-28 | Discharge: 2023-03-10 | DRG: 194 | Disposition: A | Discharge status: Home / Self Care | Payer: MEDICAID | Attending: Internal Medicine | Admitting: Internal Medicine

## 2023-02-28 ENCOUNTER — Other Ambulatory Visit: Payer: Self-pay

## 2023-02-28 ENCOUNTER — Encounter (HOSPITAL_COMMUNITY): Payer: Self-pay | Admitting: *Deleted

## 2023-02-28 DIAGNOSIS — I69354 Hemiplegia and hemiparesis following cerebral infarction affecting left non-dominant side: Secondary | ICD-10-CM

## 2023-02-28 DIAGNOSIS — E876 Hypokalemia: Secondary | ICD-10-CM | POA: Diagnosis present

## 2023-02-28 DIAGNOSIS — Z885 Allergy status to narcotic agent status: Secondary | ICD-10-CM

## 2023-02-28 DIAGNOSIS — I509 Heart failure, unspecified: Secondary | ICD-10-CM

## 2023-02-28 DIAGNOSIS — M545 Low back pain, unspecified: Secondary | ICD-10-CM

## 2023-02-28 DIAGNOSIS — J209 Acute bronchitis, unspecified: Secondary | ICD-10-CM | POA: Diagnosis present

## 2023-02-28 DIAGNOSIS — J111 Influenza due to unidentified influenza virus with other respiratory manifestations: Secondary | ICD-10-CM

## 2023-02-28 DIAGNOSIS — Z7982 Long term (current) use of aspirin: Secondary | ICD-10-CM

## 2023-02-28 DIAGNOSIS — Z9152 Personal history of nonsuicidal self-harm: Secondary | ICD-10-CM

## 2023-02-28 DIAGNOSIS — F410 Panic disorder [episodic paroxysmal anxiety] without agoraphobia: Secondary | ICD-10-CM | POA: Diagnosis present

## 2023-02-28 DIAGNOSIS — Z5900 Homelessness unspecified: Secondary | ICD-10-CM

## 2023-02-28 DIAGNOSIS — F332 Major depressive disorder, recurrent severe without psychotic features: Secondary | ICD-10-CM | POA: Diagnosis present

## 2023-02-28 DIAGNOSIS — F21 Schizotypal disorder: Secondary | ICD-10-CM | POA: Diagnosis present

## 2023-02-28 DIAGNOSIS — J101 Influenza due to other identified influenza virus with other respiratory manifestations: Principal | ICD-10-CM | POA: Diagnosis present

## 2023-02-28 DIAGNOSIS — Z91148 Patient's other noncompliance with medication regimen for other reason: Secondary | ICD-10-CM

## 2023-02-28 DIAGNOSIS — F4310 Post-traumatic stress disorder, unspecified: Secondary | ICD-10-CM | POA: Diagnosis present

## 2023-02-28 DIAGNOSIS — K219 Gastro-esophageal reflux disease without esophagitis: Secondary | ICD-10-CM | POA: Diagnosis present

## 2023-02-28 DIAGNOSIS — R42 Dizziness and giddiness: Secondary | ICD-10-CM | POA: Diagnosis present

## 2023-02-28 DIAGNOSIS — I445 Left posterior fascicular block: Secondary | ICD-10-CM | POA: Diagnosis present

## 2023-02-28 DIAGNOSIS — M79605 Pain in left leg: Secondary | ICD-10-CM

## 2023-02-28 DIAGNOSIS — Z7951 Long term (current) use of inhaled steroids: Secondary | ICD-10-CM

## 2023-02-28 DIAGNOSIS — I2510 Atherosclerotic heart disease of native coronary artery without angina pectoris: Secondary | ICD-10-CM | POA: Diagnosis present

## 2023-02-28 DIAGNOSIS — Z882 Allergy status to sulfonamides status: Secondary | ICD-10-CM

## 2023-02-28 DIAGNOSIS — Z5982 Transportation insecurity: Secondary | ICD-10-CM

## 2023-02-28 DIAGNOSIS — Z9151 Personal history of suicidal behavior: Secondary | ICD-10-CM

## 2023-02-28 DIAGNOSIS — R55 Syncope and collapse: Principal | ICD-10-CM | POA: Diagnosis present

## 2023-02-28 DIAGNOSIS — Z91030 Bee allergy status: Secondary | ICD-10-CM

## 2023-02-28 DIAGNOSIS — Z955 Presence of coronary angioplasty implant and graft: Secondary | ICD-10-CM

## 2023-02-28 DIAGNOSIS — R197 Diarrhea, unspecified: Secondary | ICD-10-CM | POA: Diagnosis present

## 2023-02-28 DIAGNOSIS — Z9049 Acquired absence of other specified parts of digestive tract: Secondary | ICD-10-CM

## 2023-02-28 DIAGNOSIS — R7401 Elevation of levels of liver transaminase levels: Secondary | ICD-10-CM | POA: Diagnosis present

## 2023-02-28 DIAGNOSIS — R001 Bradycardia, unspecified: Secondary | ICD-10-CM | POA: Diagnosis present

## 2023-02-28 DIAGNOSIS — Z79899 Other long term (current) drug therapy: Secondary | ICD-10-CM

## 2023-02-28 DIAGNOSIS — R569 Unspecified convulsions: Secondary | ICD-10-CM | POA: Diagnosis present

## 2023-02-28 DIAGNOSIS — J45909 Unspecified asthma, uncomplicated: Secondary | ICD-10-CM | POA: Diagnosis present

## 2023-02-28 DIAGNOSIS — R5381 Other malaise: Secondary | ICD-10-CM | POA: Diagnosis present

## 2023-02-28 DIAGNOSIS — I10 Essential (primary) hypertension: Secondary | ICD-10-CM | POA: Diagnosis present

## 2023-02-28 DIAGNOSIS — F3113 Bipolar disorder, current episode manic without psychotic features, severe: Secondary | ICD-10-CM

## 2023-02-28 DIAGNOSIS — I252 Old myocardial infarction: Secondary | ICD-10-CM

## 2023-02-28 DIAGNOSIS — F250 Schizoaffective disorder, bipolar type: Secondary | ICD-10-CM | POA: Diagnosis present

## 2023-02-28 DIAGNOSIS — R531 Weakness: Secondary | ICD-10-CM

## 2023-02-28 DIAGNOSIS — G4733 Obstructive sleep apnea (adult) (pediatric): Secondary | ICD-10-CM | POA: Diagnosis present

## 2023-02-28 DIAGNOSIS — Z6841 Body Mass Index (BMI) 40.0 and over, adult: Secondary | ICD-10-CM

## 2023-02-28 DIAGNOSIS — E860 Dehydration: Secondary | ICD-10-CM | POA: Diagnosis present

## 2023-02-28 DIAGNOSIS — Z91410 Personal history of adult physical and sexual abuse: Secondary | ICD-10-CM

## 2023-02-28 LAB — URINALYSIS, ROUTINE W REFLEX MICROSCOPIC
Bilirubin Urine: NEGATIVE
Glucose, UA: NEGATIVE mg/dL
Ketones, ur: 5 mg/dL — AB
Leukocytes,Ua: NEGATIVE
Nitrite: NEGATIVE
Protein, ur: 100 mg/dL — AB
Specific Gravity, Urine: 1.024 (ref 1.005–1.030)
pH: 5 (ref 5.0–8.0)

## 2023-02-28 LAB — CBC
HCT: 46 % (ref 36.0–46.0)
Hemoglobin: 15 g/dL (ref 12.0–15.0)
MCH: 30.1 pg (ref 26.0–34.0)
MCHC: 32.6 g/dL (ref 30.0–36.0)
MCV: 92.2 fL (ref 80.0–100.0)
Platelets: 184 10*3/uL (ref 150–400)
RBC: 4.99 MIL/uL (ref 3.87–5.11)
RDW: 13.6 % (ref 11.5–15.5)
WBC: 5.5 10*3/uL (ref 4.0–10.5)
nRBC: 0 % (ref 0.0–0.2)

## 2023-02-28 LAB — BLOOD GAS, VENOUS
Acid-Base Excess: 0.1 mmol/L (ref 0.0–2.0)
Bicarbonate: 26 mmol/L (ref 20.0–28.0)
O2 Saturation: 73.8 %
Patient temperature: 37
pCO2, Ven: 46 mm[Hg] (ref 44–60)
pH, Ven: 7.36 (ref 7.25–7.43)
pO2, Ven: 44 mm[Hg] (ref 32–45)

## 2023-02-28 LAB — BASIC METABOLIC PANEL
Anion gap: 14 (ref 5–15)
BUN: 18 mg/dL (ref 6–20)
CO2: 21 mmol/L — ABNORMAL LOW (ref 22–32)
Calcium: 8.6 mg/dL — ABNORMAL LOW (ref 8.9–10.3)
Chloride: 102 mmol/L (ref 98–111)
Creatinine, Ser: 0.71 mg/dL (ref 0.44–1.00)
GFR, Estimated: >60 mL/min (ref >60)
Glucose, Bld: 90 mg/dL (ref 70–99)
Potassium: 3.1 mmol/L — ABNORMAL LOW (ref 3.5–5.1)
Sodium: 137 mmol/L (ref 135–145)

## 2023-02-28 LAB — TROPONIN I (HIGH SENSITIVITY): Troponin I (High Sensitivity): 6 ng/L (ref <18)

## 2023-02-28 LAB — RESP PANEL BY RT-PCR (RSV, FLU A&B, COVID)  RVPGX2
Influenza A by PCR: POSITIVE — AB
Influenza B by PCR: NEGATIVE
Resp Syncytial Virus by PCR: NEGATIVE
SARS Coronavirus 2 by RT PCR: NEGATIVE

## 2023-02-28 LAB — SARS CORONAVIRUS 2 BY RT PCR: SARS Coronavirus 2 by RT PCR: NEGATIVE

## 2023-02-28 LAB — BRAIN NATRIURETIC PEPTIDE: B Natriuretic Peptide: 30.9 pg/mL (ref 0.0–100.0)

## 2023-02-28 LAB — CBG MONITORING, ED: Glucose-Capillary: 86 mg/dL (ref 70–99)

## 2023-02-28 LAB — D-DIMER, QUANTITATIVE: D-Dimer, Quant: 1.46 ug{FEU}/mL — ABNORMAL HIGH (ref 0.00–0.50)

## 2023-02-28 MED ORDER — ACETAMINOPHEN 500 MG PO TABS
1000.0000 mg | ORAL_TABLET | Freq: Once | ORAL | Status: AC
  Administered 2023-02-28: 1000 mg via ORAL
  Filled 2023-02-28: qty 2

## 2023-02-28 MED ORDER — IPRATROPIUM-ALBUTEROL 0.5-2.5 (3) MG/3ML IN SOLN
3.0000 mL | Freq: Once | RESPIRATORY_TRACT | Status: AC
  Administered 2023-02-28: 3 mL via RESPIRATORY_TRACT
  Filled 2023-02-28: qty 3

## 2023-02-28 MED ORDER — SODIUM CHLORIDE 0.9 % IV BOLUS
1000.0000 mL | Freq: Once | INTRAVENOUS | Status: AC
  Administered 2023-02-28: 1000 mL via INTRAVENOUS

## 2023-02-28 MED ORDER — METHYLPREDNISOLONE SODIUM SUCC 125 MG IJ SOLR
125.0000 mg | Freq: Once | INTRAMUSCULAR | Status: AC
  Administered 2023-02-28: 125 mg via INTRAVENOUS
  Filled 2023-02-28: qty 2

## 2023-02-28 MED ORDER — IOHEXOL 350 MG/ML SOLN
75.0000 mL | Freq: Once | INTRAVENOUS | Status: AC | PRN
  Administered 2023-02-28: 75 mL via INTRAVENOUS

## 2023-02-28 NOTE — ED Triage Notes (Signed)
BIB EMS with flu like symptoms and fatigue x 2 days. Pt had syncopal episode today, history of asthma, wheezing, used inhaler and duo neb by EMS. Unable to obtain line. CBG 83-105/66-80-98% on neb.

## 2023-02-28 NOTE — ED Provider Notes (Signed)
[facility] AT [facility] Provider Note  CSN: [id number] Arrival date & time: 02/28/23 1557  Chief Complaint(s) flu like symptoms, Fatigue, and Loss of Consciousness  HPI [name] [name] is a 48 y.o. female history of CHF, coronary artery disease, hypertension presenting to the emergency department with cough.  Patient reports cough, shortness of breath, subjective fevers and chills, runny nose, sore throat.  Reports has been around sick contacts.  Also reports syncopal episode today, having some chest pain especially with coughing.  No leg swelling.  Had a brief syncopal episode which was witnessed by ER staff after getting up from the toilet.   Past Medical History Past Medical History:  Diagnosis Date   Asthma    Bipolar 1 disorder (HCC)    Cancer (HCC)    CHF (congestive heart failure) (HCC)    Coronary artery disease    Depression    Hypertension    Obesity    PTSD (post-traumatic stress disorder)    Seizures (HCC)    Patient Active Problem List   Diagnosis Date Noted   Syncope 02/28/2023   Mood disorder (HCC) 12/31/2022   Otitis externa 12/31/2022   PTSD (post-traumatic stress disorder)    Weakness 10/01/2022   Acute pain of left lower extremity 09/30/2022   Asthma, chronic 09/30/2022   CHF (congestive heart failure) (HCC) 09/30/2022   MDD (major depressive disorder), recurrent severe, without psychosis (HCC) 09/24/2022   Back pain 04/21/2013   HTN (hypertension) 04/21/2013   Home Medication(s) Prior to Admission medications   Medication Sig Start Date End Date Taking? Authorizing Provider  albuterol (PROVENTIL HFA;VENTOLIN HFA) 108 (90 BASE) MCG/ACT inhaler Inhale 2 puffs into the lungs every 6 (six) hours as needed for wheezing or shortness of breath (SOB).   Yes [provider]  atorvastatin (LIPITOR) 40 MG tablet Take 1 tablet (40 mg total) by mouth daily. 01/17/23  Yes O'Neal, Ronnald Ramp, MD  ibuprofen (ADVIL) 600  MG tablet Take 1 tablet (600 mg total) by mouth every 6 (six) hours as needed. Patient taking differently: Take 600 mg by mouth 3 (three) times daily. 02/23/23  Yes Derwood Kaplan, MD  oxyCODONE-acetaminophen (PERCOCET/ROXICET) 5-325 MG tablet Take 1 tablet by mouth every 8 (eight) hours as needed for severe pain (pain score 7-10). 02/23/23  Yes Derwood Kaplan, MD  acetic acid-hydrocortisone (VOSOL-HC) OTIC solution Place 5 drops into the left ear 2 (two) times daily. Insert a wick of cotton into the ear canal saturated with solution; keep moist by adding 3 to 5 drops into ear canal every 4 to 6 hours for 24 hours; may remove wick after 24 hours, continue to instill 5 drops into ear canal 3 or 4 times daily for as long as indicated 01/06/23   Annett Fabian, MD  alum & mag hydroxide-simeth (MAALOX PLUS) 400-400-40 MG/5ML suspension Take 15 mLs by mouth every 6 (six) hours as needed for indigestion. 10/06/22   Lonell Grandchild, MD  ARIPiprazole (ABILIFY) 15 MG tablet Take 1 tablet (15 mg total) by mouth daily for 14 days. 10/02/22 10/16/22  Lauree Chandler, NP  EPINEPHrine 0.3 mg/0.3 mL IJ SOAJ injection Inject 0.3 mg into the muscle as needed for anaphylaxis. Patient not taking: Reported on 01/17/2023 08/19/18   [provider]  FLUoxetine (PROZAC) 40 MG capsule Take 1 capsule (40 mg total) by mouth daily for 14 days. 10/02/22 10/16/22  Lauree Chandler, NP  fluticasone-salmeterol (ADVAIR) 100-50 MCG/ACT AEPB Inhale 1 puff into the  lungs 2 (two) times daily. Patient not taking: Reported on 01/17/2023 12/31/22 12/31/23  Annett Fabian, MD  gabapentin (NEURONTIN) 300 MG capsule Take 2 capsules (600 mg total) by mouth 3 (three) times daily for 14 days. 12/31/22 01/14/23  Annett Fabian, MD  lidocaine-prilocaine (EMLA) cream Apply 1 Application topically as needed. Patient not taking: Reported on 01/17/2023 12/31/22   Annett Fabian, MD  loperamide (IMODIUM) 2 MG capsule Take 1 capsule (2 mg  total) by mouth 4 (four) times daily as needed for diarrhea or loose stools. Patient not taking: Reported on 01/17/2023 12/22/22   Roemhildt, Lorin T, PA-C  methocarbamol (ROBAXIN) 500 MG tablet Take 1 tablet (500 mg total) by mouth 2 (two) times daily. 02/23/23   Derwood Kaplan, MD  pantoprazole (PROTONIX) 40 MG tablet Take 1 tablet (40 mg total) by mouth daily. 01/17/23   O'NealRonnald Ramp, MD  prazosin (MINIPRESS) 5 MG capsule Take 1 capsule (5 mg total) by mouth at bedtime for 14 days. 12/31/22 01/14/23  Annett Fabian, MD  promethazine (PHENERGAN) 25 MG tablet Take 1 tablet (25 mg total) by mouth every 6 (six) hours as needed for nausea or vomiting. Patient not taking: Reported on 01/17/2023 12/22/22   Roemhildt, Lorin T, PA-C  topiramate (TOPAMAX) 25 MG tablet Take 1 tablet (25 mg total) by mouth 2 (two) times daily at 8 am and 4 pm for 14 days. 10/01/22 01/06/23  Lauree Chandler, NP  zolpidem (AMBIEN) 5 MG tablet Take 1 tablet (5 mg total) by mouth at bedtime as needed for up to 14 days for sleep. 10/01/22 01/06/23  Lauree Chandler, NP                                                                                                                                    Past Surgical History Past Surgical History:  Procedure Laterality Date   CARDIAC CATHETERIZATION     CESAREAN SECTION     CHOLECYSTECTOMY     Family History History reviewed. No pertinent family history.  Social History Social History   Tobacco Use   Smoking status: Never   Smokeless tobacco: Never  Vaping Use   Vaping status: Never Used  Substance Use Topics   Alcohol use: Yes    Comment: occ   Drug use: No   Allergies Bee venom, Sulfa antibiotics, and Ultram [tramadol]  Review of Systems Review of Systems  All other systems reviewed and are negative.   Physical Exam Vital Signs  I have reviewed the triage vital signs BP 111/79 (BP Location: Left Arm)   Pulse 80   Temp 99.2 F (37.3 C) (Oral)    Resp 20   Ht 5' 2.25" (1.581 m)   Wt (!) 163.3 kg   SpO2 95%   BMI 65.32 kg/m  Physical Exam Vitals and nursing note reviewed.  Constitutional:      General: She is not in acute distress.  Appearance: She is well-developed. She is obese.     Comments: Frequent cough  HENT:     Head: Normocephalic and atraumatic.     Mouth/Throat:     Mouth: Mucous membranes are moist.  Eyes:     Pupils: Pupils are equal, round, and reactive to light.  Cardiovascular:     Rate and Rhythm: Normal rate and regular rhythm.     Heart sounds: No murmur heard. Pulmonary:     Effort: Pulmonary effort is normal. No respiratory distress.     Breath sounds: Rhonchi (vs transmitted upper airway sounds) present. No wheezing.     Comments: Transmitted upper airway sounds Abdominal:     General: Abdomen is flat.     Palpations: Abdomen is soft.     Tenderness: There is no abdominal tenderness.  Musculoskeletal:        General: No tenderness.     Right lower leg: No edema.     Left lower leg: No edema.  Skin:    General: Skin is warm and dry.  Neurological:     General: No focal deficit present.     Mental Status: She is alert. Mental status is at baseline.  Psychiatric:        Mood and Affect: Mood normal.        Behavior: Behavior normal.     ED Results and Treatments Labs (all labs ordered are listed, but only abnormal results are displayed) Labs Reviewed  RESP PANEL BY RT-PCR (RSV, FLU A&B, COVID)  RVPGX2 - Abnormal; Notable for the following components:      Result Value   Influenza A by PCR POSITIVE (*)    All other components within normal limits  BASIC METABOLIC PANEL - Abnormal; Notable for the following components:   Potassium 3.1 (*)    CO2 21 (*)    Calcium 8.6 (*)    All other components within normal limits  URINALYSIS, ROUTINE W REFLEX MICROSCOPIC - Abnormal; Notable for the following components:   Color, Urine AMBER (*)    APPearance HAZY (*)    Hgb urine dipstick SMALL  (*)    Ketones, ur 5 (*)    Protein, ur 100 (*)    Bacteria, UA RARE (*)    All other components within normal limits  D-DIMER, QUANTITATIVE - Abnormal; Notable for the following components:   D-Dimer, Quant 1.46 (*)    All other components within normal limits  SARS CORONAVIRUS 2 BY RT PCR  CBC  BRAIN NATRIURETIC PEPTIDE  BLOOD GAS, VENOUS  CBG MONITORING, ED  TROPONIN I (HIGH SENSITIVITY)                                                                                                                          Radiology CT Angio Chest PE W and/or Wo Contrast Result Date: 02/28/2023 CLINICAL DATA:  Suspected pulmonary embolism. EXAM: CT ANGIOGRAPHY CHEST WITH CONTRAST TECHNIQUE: Multidetector CT imaging of the chest was performed  using the standard protocol during bolus administration of intravenous contrast. Multiplanar CT image reconstructions and MIPs were obtained to evaluate the vascular anatomy. RADIATION DOSE REDUCTION: This exam was performed according to the departmental dose-optimization program which includes automated exposure control, adjustment of the mA and/or kV according to patient size and/or use of iterative reconstruction technique. CONTRAST:  75mL OMNIPAQUE IOHEXOL 350 MG/ML SOLN COMPARISON:  October 06, 2022 FINDINGS: Cardiovascular: The thoracic aorta is normal in appearance. Satisfactory opacification of the pulmonary arteries to the segmental level. No evidence of pulmonary embolism. Normal heart size. No pericardial effusion. Mediastinum/Nodes: No enlarged mediastinal, hilar, or axillary lymph nodes. Thyroid gland, trachea, and esophagus demonstrate no significant findings. Lungs/Pleura: There is no evidence of acute infiltrate. Mild to moderate severity predominately central and perihilar peribronchial thickening is noted. No pleural effusion or pneumothorax. Upper Abdomen: Multiple surgical clips are seen within the gallbladder fossa. Musculoskeletal: No chest wall  abnormality. No acute or significant osseous findings. Review of the MIP images confirms the above findings. IMPRESSION: 1. No evidence of pulmonary embolism. 2. Mild to moderate severity bronchitis. 3. Evidence of prior cholecystectomy. Electronically Signed   By: Aram Candela M.D.   On: 02/28/2023 21:30   DG Chest Portable 1 View Result Date: 02/28/2023 CLINICAL DATA:  Shortness of breath EXAM: PORTABLE CHEST 1 VIEW COMPARISON:  12/22/2022 FINDINGS: Numerous leads and wires project over the chest. Apical lordotic positioning. Midline trachea. Cardiomegaly accentuated by AP portable technique. Mildly degraded exam due to AP portable technique and patient body habitus. No pleural effusion or pneumothorax. No congestive failure. IMPRESSION: Cardiomegaly without congestive failure. Electronically Signed   By: Jeronimo Greaves M.D.   On: 02/28/2023 17:53    Pertinent labs & imaging results that were available during my care of the patient were reviewed by me and considered in my medical decision making (see MDM for details).  Medications Ordered in ED Medications  sodium chloride 0.9 % bolus 1,000 mL (0 mLs Intravenous Stopped 02/28/23 2314)  ipratropium-albuterol (DUONEB) 0.5-2.5 (3) MG/3ML nebulizer solution 3 mL (3 mLs Nebulization Given 02/28/23 1902)  acetaminophen (TYLENOL) tablet 1,000 mg (1,000 mg Oral Given 02/28/23 1940)  iohexol (OMNIPAQUE) 350 MG/ML injection 75 mL (75 mLs Intravenous Contrast Given 02/28/23 2055)  sodium chloride 0.9 % bolus 1,000 mL (1,000 mLs Intravenous New Bag/Given 02/28/23 2311)  ipratropium-albuterol (DUONEB) 0.5-2.5 (3) MG/3ML nebulizer solution 3 mL (3 mLs Nebulization Given 02/28/23 2310)  methylPREDNISolone sodium succinate (SOLU-MEDROL) 125 mg/2 mL injection 125 mg (125 mg Intravenous Given 02/28/23 2310)                                                                                                                                      Procedures Procedures  (including critical care time)  Medical Decision Making / ED Course   MDM:  48 year old presenting to the emergency department with shortness of breath.  Patient overall well-appearing, frequently coughing.  Did have a brief syncopal event after getting off the toilet which was witnessed by ER staff.  Symptoms seem consistent with likely underlying viral infection, will check flu, COVID, RSV testing.  Patient has some questionable rhonchi on exam versus transmitted upper airway sounds, will check chest x-ray to evaluate for underlying pneumonia.  Patient does not appear very volume overloaded with no lower extremity swelling, but given reported history of CHF will check BNP.  She does have reported history of coronary artery disease and is complaining of pleuritic chest pain, will check D-dimer, troponin.  Will check EKG.  Will reassess.  No wheezing is appreciable on my exam to suggest underlying asthma and she has good air movement.  Clinical Course as of 02/28/23 2320  Fri Feb 28, 2023  2318 Notable for positive flu test.  CTA was obtained given positive D-dimer, there is no evidence of any underlying pulmonary embolism but did show bronchitis.  Attempted to get patient up and ambulatory, however patient had almost another syncopal episode, became very tachypneic.  On reassessment patient with continued wheezing, will give steroids, DuoNeb.  Given syncopal episode, respiratory distress, underlying CHF, discussed with hospitalist Dr. Toniann Fail who will admit the patient. [WS]    Clinical Course User Index [WS] Lonell Grandchild, MD     Additional history obtained: -Additional history obtained from ems -External records from outside source obtained and reviewed including: Chart review including previous notes, labs, imaging, consultation notes including prior cardiology notes    Lab Tests: -I ordered, reviewed, and interpreted labs.   The pertinent results  include:   Labs Reviewed  RESP PANEL BY RT-PCR (RSV, FLU A&B, COVID)  RVPGX2 - Abnormal; Notable for the following components:      Result Value   Influenza A by PCR POSITIVE (*)    All other components within normal limits  BASIC METABOLIC PANEL - Abnormal; Notable for the following components:   Potassium 3.1 (*)    CO2 21 (*)    Calcium 8.6 (*)    All other components within normal limits  URINALYSIS, ROUTINE W REFLEX MICROSCOPIC - Abnormal; Notable for the following components:   Color, Urine AMBER (*)    APPearance HAZY (*)    Hgb urine dipstick SMALL (*)    Ketones, ur 5 (*)    Protein, ur 100 (*)    Bacteria, UA RARE (*)    All other components within normal limits  D-DIMER, QUANTITATIVE - Abnormal; Notable for the following components:   D-Dimer, Quant 1.46 (*)    All other components within normal limits  SARS CORONAVIRUS 2 BY RT PCR  CBC  BRAIN NATRIURETIC PEPTIDE  BLOOD GAS, VENOUS  CBG MONITORING, ED  TROPONIN I (HIGH SENSITIVITY)    Notable for +flu   EKG   EKG Interpretation Date/Time:  Friday February 28 2023 16:08:59 EST Ventricular Rate:  76 PR Interval:  135 QRS Duration:  87 QT Interval:  387 QTC Calculation: 436 R Axis:   124  Text Interpretation: Sinus rhythm Left posterior fascicular block Anterior infarct, old Confirmed by Alvino Blood ([id number]) on 02/28/2023 10:26:18 PM         Imaging Studies ordered: I ordered imaging studies including CT chest On my interpretation imaging demonstrates bronchitis  I independently visualized and interpreted imaging. I agree with the radiologist interpretation   Medicines ordered and prescription drug management: Meds ordered this encounter  Medications   sodium chloride 0.9 % bolus 1,000 mL   ipratropium-albuterol (DUONEB)  0.5-2.5 (3) MG/3ML nebulizer solution 3 mL   acetaminophen (TYLENOL) tablet 1,000 mg   iohexol (OMNIPAQUE) 350 MG/ML injection 75 mL   sodium chloride 0.9 % bolus 1,000 mL    ipratropium-albuterol (DUONEB) 0.5-2.5 (3) MG/3ML nebulizer solution 3 mL   methylPREDNISolone sodium succinate (SOLU-MEDROL) 125 mg/2 mL injection 125 mg    IV methylprednisolone will be converted to either a q12h or q24h frequency with the same total daily dose (TDD).  Ordered Dose: 1 to 125 mg TDD; convert to: TDD q24h.  Ordered Dose: 126 to 250 mg TDD; convert to: TDD div q12h.  Ordered Dose: >250 mg TDD; DAW.    -I have reviewed the patients home medicines and have made adjustments as needed   Consultations Obtained: I requested consultation with the hospitalist,  and discussed lab and imaging findings as well as pertinent plan - they recommend: admission   Cardiac Monitoring: The patient was maintained on a cardiac monitor.  I personally viewed and interpreted the cardiac monitored which showed an underlying rhythm of: NSR  Social Determinants of Health:  Diagnosis or treatment significantly limited by social determinants of health: obesity   Reevaluation: After the interventions noted above, I reevaluated the patient and found that their symptoms have improved  Co morbidities that complicate the patient evaluation  Past Medical History:  Diagnosis Date   Asthma    Bipolar 1 disorder (HCC)    Cancer (HCC)    CHF (congestive heart failure) (HCC)    Coronary artery disease    Depression    Hypertension    Obesity    PTSD (post-traumatic stress disorder)    Seizures (HCC)       Dispostion: Disposition decision including need for hospitalization was considered, and patient admitted to the hospital.    Final Clinical Impression(s) / ED Diagnoses Final diagnoses:  Syncope, unspecified syncope type  Influenza     This chart was dictated using voice recognition software.  Despite best efforts to proofread,  errors can occur which can change the documentation meaning.    Lonell Grandchild, MD 02/28/23 [phone]

## 2023-03-01 ENCOUNTER — Observation Stay (HOSPITAL_COMMUNITY): Payer: MEDICAID

## 2023-03-01 ENCOUNTER — Encounter (HOSPITAL_COMMUNITY): Payer: Self-pay | Admitting: Internal Medicine

## 2023-03-01 DIAGNOSIS — I2510 Atherosclerotic heart disease of native coronary artery without angina pectoris: Secondary | ICD-10-CM

## 2023-03-01 DIAGNOSIS — F332 Major depressive disorder, recurrent severe without psychotic features: Secondary | ICD-10-CM | POA: Diagnosis not present

## 2023-03-01 DIAGNOSIS — Z6841 Body Mass Index (BMI) 40.0 and over, adult: Secondary | ICD-10-CM

## 2023-03-01 DIAGNOSIS — F21 Schizotypal disorder: Secondary | ICD-10-CM | POA: Diagnosis present

## 2023-03-01 DIAGNOSIS — F250 Schizoaffective disorder, bipolar type: Secondary | ICD-10-CM | POA: Diagnosis present

## 2023-03-01 DIAGNOSIS — K219 Gastro-esophageal reflux disease without esophagitis: Secondary | ICD-10-CM | POA: Diagnosis present

## 2023-03-01 DIAGNOSIS — Z7982 Long term (current) use of aspirin: Secondary | ICD-10-CM | POA: Diagnosis not present

## 2023-03-01 DIAGNOSIS — I69354 Hemiplegia and hemiparesis following cerebral infarction affecting left non-dominant side: Secondary | ICD-10-CM | POA: Diagnosis not present

## 2023-03-01 DIAGNOSIS — Z7951 Long term (current) use of inhaled steroids: Secondary | ICD-10-CM | POA: Diagnosis not present

## 2023-03-01 DIAGNOSIS — Z79899 Other long term (current) drug therapy: Secondary | ICD-10-CM | POA: Diagnosis not present

## 2023-03-01 DIAGNOSIS — Z955 Presence of coronary angioplasty implant and graft: Secondary | ICD-10-CM | POA: Diagnosis not present

## 2023-03-01 DIAGNOSIS — J101 Influenza due to other identified influenza virus with other respiratory manifestations: Secondary | ICD-10-CM | POA: Diagnosis present

## 2023-03-01 DIAGNOSIS — R42 Dizziness and giddiness: Secondary | ICD-10-CM | POA: Diagnosis present

## 2023-03-01 DIAGNOSIS — G4733 Obstructive sleep apnea (adult) (pediatric): Secondary | ICD-10-CM | POA: Diagnosis present

## 2023-03-01 DIAGNOSIS — R55 Syncope and collapse: Secondary | ICD-10-CM | POA: Diagnosis present

## 2023-03-01 DIAGNOSIS — E876 Hypokalemia: Secondary | ICD-10-CM | POA: Diagnosis present

## 2023-03-01 DIAGNOSIS — R569 Unspecified convulsions: Secondary | ICD-10-CM | POA: Diagnosis present

## 2023-03-01 DIAGNOSIS — E860 Dehydration: Secondary | ICD-10-CM | POA: Diagnosis present

## 2023-03-01 DIAGNOSIS — J209 Acute bronchitis, unspecified: Secondary | ICD-10-CM

## 2023-03-01 DIAGNOSIS — Z882 Allergy status to sulfonamides status: Secondary | ICD-10-CM | POA: Diagnosis not present

## 2023-03-01 DIAGNOSIS — J45909 Unspecified asthma, uncomplicated: Secondary | ICD-10-CM | POA: Diagnosis present

## 2023-03-01 DIAGNOSIS — I252 Old myocardial infarction: Secondary | ICD-10-CM | POA: Diagnosis not present

## 2023-03-01 DIAGNOSIS — F3113 Bipolar disorder, current episode manic without psychotic features, severe: Secondary | ICD-10-CM | POA: Diagnosis not present

## 2023-03-01 DIAGNOSIS — F4310 Post-traumatic stress disorder, unspecified: Secondary | ICD-10-CM | POA: Diagnosis not present

## 2023-03-01 DIAGNOSIS — R440 Auditory hallucinations: Secondary | ICD-10-CM | POA: Diagnosis not present

## 2023-03-01 DIAGNOSIS — I10 Essential (primary) hypertension: Secondary | ICD-10-CM | POA: Diagnosis present

## 2023-03-01 DIAGNOSIS — Z5900 Homelessness unspecified: Secondary | ICD-10-CM | POA: Diagnosis not present

## 2023-03-01 LAB — CBC WITH DIFFERENTIAL/PLATELET
Abs Immature Granulocytes: 0.01 10*3/uL (ref 0.00–0.07)
Basophils Absolute: 0 10*3/uL (ref 0.0–0.1)
Basophils Relative: 0 %
Eosinophils Absolute: 0 10*3/uL (ref 0.0–0.5)
Eosinophils Relative: 0 %
HCT: 43.6 % (ref 36.0–46.0)
Hemoglobin: 14.4 g/dL (ref 12.0–15.0)
Immature Granulocytes: 0 %
Lymphocytes Relative: 40 %
Lymphs Abs: 1 10*3/uL (ref 0.7–4.0)
MCH: 30.5 pg (ref 26.0–34.0)
MCHC: 33 g/dL (ref 30.0–36.0)
MCV: 92.4 fL (ref 80.0–100.0)
Monocytes Absolute: 0.1 10*3/uL (ref 0.1–1.0)
Monocytes Relative: 2 %
Neutro Abs: 1.5 10*3/uL — ABNORMAL LOW (ref 1.7–7.7)
Neutrophils Relative %: 58 %
Platelets: 169 10*3/uL (ref 150–400)
RBC: 4.72 MIL/uL (ref 3.87–5.11)
RDW: 13.6 % (ref 11.5–15.5)
WBC: 2.6 10*3/uL — ABNORMAL LOW (ref 4.0–10.5)
nRBC: 0 % (ref 0.0–0.2)

## 2023-03-01 LAB — BASIC METABOLIC PANEL
Anion gap: 8 (ref 5–15)
BUN: 14 mg/dL (ref 6–20)
CO2: 23 mmol/L (ref 22–32)
Calcium: 8.6 mg/dL — ABNORMAL LOW (ref 8.9–10.3)
Chloride: 106 mmol/L (ref 98–111)
Creatinine, Ser: 0.63 mg/dL (ref 0.44–1.00)
GFR, Estimated: >60 mL/min (ref >60)
Glucose, Bld: 172 mg/dL — ABNORMAL HIGH (ref 70–99)
Potassium: 3.6 mmol/L (ref 3.5–5.1)
Sodium: 137 mmol/L (ref 135–145)

## 2023-03-01 LAB — HEPATIC FUNCTION PANEL
ALT: 370 U/L — ABNORMAL HIGH (ref 0–44)
AST: 227 U/L — ABNORMAL HIGH (ref 15–41)
Albumin: 3.3 g/dL — ABNORMAL LOW (ref 3.5–5.0)
Alkaline Phosphatase: 168 U/L — ABNORMAL HIGH (ref 38–126)
Bilirubin, Direct: 0.2 mg/dL (ref 0.0–0.2)
Indirect Bilirubin: 0.8 mg/dL (ref 0.3–0.9)
Total Bilirubin: 1 mg/dL (ref 0.0–1.2)
Total Protein: 7.5 g/dL (ref 6.5–8.1)

## 2023-03-01 LAB — HIV ANTIBODY (ROUTINE TESTING W REFLEX): HIV Screen 4th Generation wRfx: NONREACTIVE

## 2023-03-01 LAB — ECHOCARDIOGRAM COMPLETE
Area-P 1/2: 2.97 cm2
Height: 62.25 in
S' Lateral: 3.5 cm
Weight: 5760.18 [oz_av]

## 2023-03-01 LAB — TSH: TSH: 0.76 u[IU]/mL (ref 0.350–4.500)

## 2023-03-01 LAB — PREGNANCY, URINE: Preg Test, Ur: NEGATIVE

## 2023-03-01 LAB — MAGNESIUM: Magnesium: 2.2 mg/dL (ref 1.7–2.4)

## 2023-03-01 MED ORDER — ENOXAPARIN SODIUM 80 MG/0.8ML IJ SOSY
80.0000 mg | PREFILLED_SYRINGE | INTRAMUSCULAR | Status: DC
  Administered 2023-03-02 – 2023-03-10 (×9): 80 mg via SUBCUTANEOUS
  Filled 2023-03-01 (×9): qty 0.8

## 2023-03-01 MED ORDER — GABAPENTIN 300 MG PO CAPS
600.0000 mg | ORAL_CAPSULE | Freq: Three times a day (TID) | ORAL | Status: DC
  Administered 2023-03-01 – 2023-03-10 (×28): 600 mg via ORAL
  Filled 2023-03-01 (×28): qty 2

## 2023-03-01 MED ORDER — BENZONATATE 100 MG PO CAPS
200.0000 mg | ORAL_CAPSULE | Freq: Three times a day (TID) | ORAL | Status: DC
Start: 2023-03-01 — End: 2023-03-10
  Administered 2023-03-01 – 2023-03-10 (×26): 200 mg via ORAL
  Filled 2023-03-01 (×26): qty 2

## 2023-03-01 MED ORDER — POTASSIUM CHLORIDE 20 MEQ PO PACK
20.0000 meq | PACK | Freq: Once | ORAL | Status: AC
  Administered 2023-03-01: 20 meq via ORAL
  Filled 2023-03-01: qty 1

## 2023-03-01 MED ORDER — GUAIFENESIN 100 MG/5ML PO LIQD
5.0000 mL | ORAL | Status: DC | PRN
  Administered 2023-03-02 – 2023-03-07 (×5): 5 mL via ORAL
  Filled 2023-03-01 (×5): qty 10

## 2023-03-01 MED ORDER — ATORVASTATIN CALCIUM 40 MG PO TABS
40.0000 mg | ORAL_TABLET | Freq: Every day | ORAL | Status: DC
  Administered 2023-03-01 – 2023-03-10 (×10): 40 mg via ORAL
  Filled 2023-03-01 (×10): qty 1

## 2023-03-01 MED ORDER — ACETAMINOPHEN 650 MG RE SUPP: 650.0000 mg | Freq: Four times a day (QID) | RECTAL | Status: DC | PRN

## 2023-03-01 MED ORDER — OXYCODONE-ACETAMINOPHEN 5-325 MG PO TABS
1.0000 | ORAL_TABLET | Freq: Three times a day (TID) | ORAL | Status: DC | PRN
  Administered 2023-03-01 – 2023-03-10 (×19): 1 via ORAL
  Filled 2023-03-01 (×19): qty 1

## 2023-03-01 MED ORDER — METHYLPREDNISOLONE SODIUM SUCC 40 MG IJ SOLR
40.0000 mg | Freq: Two times a day (BID) | INTRAMUSCULAR | Status: DC
  Administered 2023-03-01 – 2023-03-04 (×7): 40 mg via INTRAVENOUS
  Filled 2023-03-01 (×7): qty 1

## 2023-03-01 MED ORDER — IPRATROPIUM-ALBUTEROL 0.5-2.5 (3) MG/3ML IN SOLN
3.0000 mL | RESPIRATORY_TRACT | Status: DC
  Administered 2023-03-01 – 2023-03-03 (×12): 3 mL via RESPIRATORY_TRACT
  Filled 2023-03-01 (×12): qty 3

## 2023-03-01 MED ORDER — ALBUTEROL SULFATE (2.5 MG/3ML) 0.083% IN NEBU
2.5000 mg | INHALATION_SOLUTION | RESPIRATORY_TRACT | Status: DC | PRN
  Administered 2023-03-04 – 2023-03-09 (×2): 2.5 mg via RESPIRATORY_TRACT
  Filled 2023-03-01: qty 3

## 2023-03-01 MED ORDER — ACETAMINOPHEN 325 MG PO TABS
650.0000 mg | ORAL_TABLET | Freq: Four times a day (QID) | ORAL | Status: DC | PRN
  Administered 2023-03-01 – 2023-03-02 (×3): 650 mg via ORAL
  Filled 2023-03-01 (×3): qty 2

## 2023-03-01 MED ORDER — IPRATROPIUM-ALBUTEROL 0.5-2.5 (3) MG/3ML IN SOLN
3.0000 mL | RESPIRATORY_TRACT | Status: DC
  Administered 2023-03-01 (×2): 3 mL via RESPIRATORY_TRACT
  Filled 2023-03-01 (×2): qty 3

## 2023-03-01 MED ORDER — ENOXAPARIN SODIUM 40 MG/0.4ML IJ SOSY
40.0000 mg | PREFILLED_SYRINGE | INTRAMUSCULAR | Status: DC
Start: 2023-03-01 — End: 2023-03-01
  Administered 2023-03-01: 40 mg via SUBCUTANEOUS
  Filled 2023-03-01: qty 0.4

## 2023-03-01 NOTE — Plan of Care (Signed)

## 2023-03-01 NOTE — H&P (Signed)
History and Physical    [name] [name]:[id number] DOB: 1975/11/10 DOA: 02/28/2023  Patient coming from: Home.  Chief Complaint: Loss of consciousness.  HPI: [name] [name] is a 48 y.o. female with history of CAD status post PCI, CHF, depression and history of seizures as per the patient has been not feeling well for the last 4 days with cough congestion and wheezing.  She also has not been eating well because of nausea vomiting.  Patient has been having some chest pain on coughing.  Yesterday patient had a brief syncopal episode at home.  Bystanders called EMS and patient was brought to the ER.  Patient was found to be wheezing.  Patient has had recently followed up with cardiologist last month and at the time was plan to have a 2D echo done as outpatient.  ED Course: In the ER CT angiogram of the chest shows bronchitis changes.  Influenza A was positive.  Patient was febrile.  Troponin and BNP were negative.  Potassium was 3.1.  EKG shows normal sinus rhythm.  Patient was placed on IV Solu-Medrol nebulizer for bronchitis in the setting of influenza A.  Fluid bolus and potassium replacement given.  Admitted for further observation.  Review of Systems: As per HPI, rest all negative.   Past Medical History:  Diagnosis Date   Asthma    Bipolar 1 disorder (HCC)    Cancer (HCC)    CHF (congestive heart failure) (HCC)    Coronary artery disease    Depression    Hypertension    Obesity    PTSD (post-traumatic stress disorder)    Seizures (HCC)     Past Surgical History:  Procedure Laterality Date   CARDIAC CATHETERIZATION     CESAREAN SECTION     CHOLECYSTECTOMY       reports that she has never smoked. She has never used smokeless tobacco. She reports current alcohol use. She reports that she does not use drugs.  Allergies  Allergen Reactions   Bee Venom Anaphylaxis and Rash   Sulfa Antibiotics Anaphylaxis   Ultram [Tramadol] Other (See Comments)    Migraines     History reviewed. No pertinent family history.  Prior to Admission medications   Medication Sig Start Date End Date Taking? Authorizing Provider  albuterol (PROVENTIL HFA;VENTOLIN HFA) 108 (90 BASE) MCG/ACT inhaler Inhale 2 puffs into the lungs every 6 (six) hours as needed for wheezing or shortness of breath (SOB).   Yes [provider]  atorvastatin (LIPITOR) 40 MG tablet Take 1 tablet (40 mg total) by mouth daily. 01/17/23  Yes O'Neal, Ronnald Ramp, MD  ibuprofen (ADVIL) 600 MG tablet Take 1 tablet (600 mg total) by mouth every 6 (six) hours as needed. Patient taking differently: Take 600 mg by mouth 3 (three) times daily. 02/23/23  Yes Derwood Kaplan, MD  oxyCODONE-acetaminophen (PERCOCET/ROXICET) 5-325 MG tablet Take 1 tablet by mouth every 8 (eight) hours as needed for severe pain (pain score 7-10). 02/23/23  Yes Derwood Kaplan, MD  acetic acid-hydrocortisone (VOSOL-HC) OTIC solution Place 5 drops into the left ear 2 (two) times daily. Insert a wick of cotton into the ear canal saturated with solution; keep moist by adding 3 to 5 drops into ear canal every 4 to 6 hours for 24 hours; may remove wick after 24 hours, continue to instill 5 drops into ear canal 3 or 4 times daily for as long as indicated 01/06/23   Annett Fabian, MD  alum & mag hydroxide-simeth (MAALOX PLUS)  400-400-40 MG/5ML suspension Take 15 mLs by mouth every 6 (six) hours as needed for indigestion. 10/06/22   Lonell Grandchild, MD  ARIPiprazole (ABILIFY) 15 MG tablet Take 1 tablet (15 mg total) by mouth daily for 14 days. 10/02/22 10/16/22  Lauree Chandler, NP  EPINEPHrine 0.3 mg/0.3 mL IJ SOAJ injection Inject 0.3 mg into the muscle as needed for anaphylaxis. Patient not taking: Reported on 01/17/2023 08/19/18   [provider]  FLUoxetine (PROZAC) 40 MG capsule Take 1 capsule (40 mg total) by mouth daily for 14 days. 10/02/22 10/16/22  Lauree Chandler, NP  fluticasone-salmeterol (ADVAIR) 100-50  MCG/ACT AEPB Inhale 1 puff into the lungs 2 (two) times daily. Patient not taking: Reported on 01/17/2023 12/31/22 12/31/23  Annett Fabian, MD  gabapentin (NEURONTIN) 300 MG capsule Take 2 capsules (600 mg total) by mouth 3 (three) times daily for 14 days. 12/31/22 01/14/23  Annett Fabian, MD  lidocaine-prilocaine (EMLA) cream Apply 1 Application topically as needed. Patient not taking: Reported on 01/17/2023 12/31/22   Annett Fabian, MD  loperamide (IMODIUM) 2 MG capsule Take 1 capsule (2 mg total) by mouth 4 (four) times daily as needed for diarrhea or loose stools. Patient not taking: Reported on 01/17/2023 12/22/22   Roemhildt, Lorin T, PA-C  methocarbamol (ROBAXIN) 500 MG tablet Take 1 tablet (500 mg total) by mouth 2 (two) times daily. 02/23/23   Derwood Kaplan, MD  pantoprazole (PROTONIX) 40 MG tablet Take 1 tablet (40 mg total) by mouth daily. 01/17/23   O'NealRonnald Ramp, MD  prazosin (MINIPRESS) 5 MG capsule Take 1 capsule (5 mg total) by mouth at bedtime for 14 days. 12/31/22 01/14/23  Annett Fabian, MD  promethazine (PHENERGAN) 25 MG tablet Take 1 tablet (25 mg total) by mouth every 6 (six) hours as needed for nausea or vomiting. Patient not taking: Reported on 01/17/2023 12/22/22   Roemhildt, Lorin T, PA-C  topiramate (TOPAMAX) 25 MG tablet Take 1 tablet (25 mg total) by mouth 2 (two) times daily at 8 am and 4 pm for 14 days. 10/01/22 01/06/23  Lauree Chandler, NP  zolpidem (AMBIEN) 5 MG tablet Take 1 tablet (5 mg total) by mouth at bedtime as needed for up to 14 days for sleep. 10/01/22 01/06/23  Lauree Chandler, NP    Physical Exam: Constitutional: Moderately built and nourished. Vitals:   02/28/23 2011 03/01/23 0009 03/01/23 0100 03/01/23 0417  BP: 111/79  (!) 128/90 (!) 158/101  Pulse: 80  (!) 56 (!) 54  Resp: 20  20 20   Temp: 99.2 F (37.3 C) 97.6 F (36.4 C)  98.7 F (37.1 C)  TempSrc: Oral Oral  Oral  SpO2: 95%  91% 96%  Weight:      Height:       Eyes:  Anicteric no pallor. ENMT: No discharge from the ears eyes nose or mouth. Neck: No mass felt.  No neck rigidity. Respiratory: Mild expiratory wheezes are no crepitations. Cardiovascular: S1-S2 heard. Abdomen: Soft nontender bowel sounds present. Musculoskeletal: No edema. Skin: No rash. Neurologic: Alert awake oriented to time place and person.  Moves all extremities 5 x 5.  No facial asymmetry tongue is midline. Psychiatric: Appears normal.  Normal affect.   Labs on Admission: I have personally reviewed following labs and imaging studies  CBC: Recent Labs  Lab 02/28/23 1630  WBC 5.5  HGB 15.0  HCT 46.0  MCV 92.2  PLT 184   Basic Metabolic Panel: Recent Labs  Lab 02/28/23 1630  NA 137  K 3.1*  CL 102  CO2 21*  GLUCOSE 90  BUN 18  CREATININE 0.71  CALCIUM 8.6*   GFR: Estimated Creatinine Clearance: 128.5 mL/min (by C-G formula based on SCr of 0.71 mg/dL). Liver Function Tests: No results for input(s): "AST", "ALT", "ALKPHOS", "BILITOT", "PROT", "ALBUMIN" in the last 168 hours. No results for input(s): "LIPASE", "AMYLASE" in the last 168 hours. No results for input(s): "AMMONIA" in the last 168 hours. Coagulation Profile: No results for input(s): "INR", "PROTIME" in the last 168 hours. Cardiac Enzymes: No results for input(s): "CKTOTAL", "CKMB", "CKMBINDEX", "TROPONINI" in the last 168 hours. BNP (last 3 results) No results for input(s): "PROBNP" in the last 8760 hours. HbA1C: No results for input(s): "HGBA1C" in the last 72 hours. CBG: Recent Labs  Lab 02/28/23 1652  GLUCAP 86   Lipid Profile: No results for input(s): "CHOL", "HDL", "LDLCALC", "TRIG", "CHOLHDL", "LDLDIRECT" in the last 72 hours. Thyroid Function Tests: No results for input(s): "TSH", "T4TOTAL", "FREET4", "T3FREE", "THYROIDAB" in the last 72 hours. Anemia Panel: No results for input(s): "VITAMINB12", "FOLATE", "FERRITIN", "TIBC", "IRON", "RETICCTPCT" in the last 72 hours. Urine analysis:     Component Value Date/Time   COLORURINE AMBER (A) 02/28/2023 1649   APPEARANCEUR HAZY (A) 02/28/2023 1649   LABSPEC 1.024 02/28/2023 1649   PHURINE 5.0 02/28/2023 1649   GLUCOSEU NEGATIVE 02/28/2023 1649   HGBUR SMALL (A) 02/28/2023 1649   BILIRUBINUR NEGATIVE 02/28/2023 1649   KETONESUR 5 (A) 02/28/2023 1649   PROTEINUR 100 (A) 02/28/2023 1649   UROBILINOGEN 0.2 05/07/2016 1559   NITRITE NEGATIVE 02/28/2023 1649   LEUKOCYTESUR NEGATIVE 02/28/2023 1649   Sepsis Labs: @LABRCNTIP (procalcitonin:4,lacticidven:4) ) Recent Results (from the past 240 hours)  SARS Coronavirus 2 by RT PCR (hospital order, performed in [facility] hospital lab) *cepheid single result test* Anterior Nasal Swab     Status: None   Collection Time: 02/28/23  4:35 PM   Specimen: Anterior Nasal Swab  Result Value Ref Range Status   SARS Coronavirus 2 by RT PCR NEGATIVE NEGATIVE Final    Comment: (NOTE) SARS-CoV-2 target nucleic acids are NOT DETECTED.  The SARS-CoV-2 RNA is generally detectable in upper and lower respiratory specimens during the acute phase of infection. The lowest concentration of SARS-CoV-2 viral copies this assay can detect is 250 copies / mL. A negative result does not preclude SARS-CoV-2 infection and should not be used as the sole basis for treatment or other patient management decisions.  A negative result may occur with improper specimen collection / handling, submission of specimen other than nasopharyngeal swab, presence of viral mutation(s) within the areas targeted by this assay, and inadequate number of viral copies (<250 copies / mL). A negative result must be combined with clinical observations, patient history, and epidemiological information.  Fact Sheet for Patients:   RoadLapTop.co.za  Fact Sheet for Healthcare Providers: http://kim-miller.com/  This test is not yet approved or  cleared by the [place] FDA and has been  authorized for detection and/or diagnosis of SARS-CoV-2 by FDA under an Emergency Use Authorization (EUA).  This EUA will remain in effect (meaning this test can be used) for the duration of the COVID-19 declaration under Section 564(b)(1) of the Act, 21 U.S.C. section 360bbb-3(b)(1), unless the authorization is terminated or revoked sooner.  Performed at [facility], [address] [place], [place] [address]   Resp panel by RT-PCR (RSV, Flu A&B, Covid) Anterior Nasal Swab     Status: Abnormal  Collection Time: 02/28/23  7:27 PM   Specimen: Anterior Nasal Swab  Result Value Ref Range Status   SARS Coronavirus 2 by RT PCR NEGATIVE NEGATIVE Final    Comment: (NOTE) SARS-CoV-2 target nucleic acids are NOT DETECTED.  The SARS-CoV-2 RNA is generally detectable in upper respiratory specimens during the acute phase of infection. The lowest concentration of SARS-CoV-2 viral copies this assay can detect is 138 copies/mL. A negative result does not preclude SARS-Cov-2 infection and should not be used as the sole basis for treatment or other patient management decisions. A negative result may occur with  improper specimen collection/handling, submission of specimen other than nasopharyngeal swab, presence of viral mutation(s) within the areas targeted by this assay, and inadequate number of viral copies(<138 copies/mL). A negative result must be combined with clinical observations, patient history, and epidemiological information. The expected result is Negative.  Fact Sheet for Patients:  BloggerCourse.com  Fact Sheet for Healthcare Providers:  SeriousBroker.it  This test is no t yet approved or cleared by the [place] FDA and  has been authorized for detection and/or diagnosis of SARS-CoV-2 by FDA under an Emergency Use Authorization (EUA). This EUA will remain  in effect (meaning this test can be used) for the  duration of the COVID-19 declaration under Section 564(b)(1) of the Act, 21 U.S.C.section 360bbb-3(b)(1), unless the authorization is terminated  or revoked sooner.       Influenza A by PCR POSITIVE (A) NEGATIVE Final   Influenza B by PCR NEGATIVE NEGATIVE Final    Comment: (NOTE) The Xpert Xpress SARS-CoV-2/FLU/RSV plus assay is intended as an aid in the diagnosis of influenza from Nasopharyngeal swab specimens and should not be used as a sole basis for treatment. Nasal washings and aspirates are unacceptable for Xpert Xpress SARS-CoV-2/FLU/RSV testing.  Fact Sheet for Patients: BloggerCourse.com  Fact Sheet for Healthcare Providers: SeriousBroker.it  This test is not yet approved or cleared by the [place] FDA and has been authorized for detection and/or diagnosis of SARS-CoV-2 by FDA under an Emergency Use Authorization (EUA). This EUA will remain in effect (meaning this test can be used) for the duration of the COVID-19 declaration under Section 564(b)(1) of the Act, 21 U.S.C. section 360bbb-3(b)(1), unless the authorization is terminated or revoked.     Resp Syncytial Virus by PCR NEGATIVE NEGATIVE Final    Comment: (NOTE) Fact Sheet for Patients: BloggerCourse.com  Fact Sheet for Healthcare Providers: SeriousBroker.it  This test is not yet approved or cleared by the [place] FDA and has been authorized for detection and/or diagnosis of SARS-CoV-2 by FDA under an Emergency Use Authorization (EUA). This EUA will remain in effect (meaning this test can be used) for the duration of the COVID-19 declaration under Section 564(b)(1) of the Act, 21 U.S.C. section 360bbb-3(b)(1), unless the authorization is terminated or revoked.  Performed at [facility], [address] [place], [place] [address]      Radiological Exams on Admission: CT  Angio Chest PE W and/or Wo Contrast Result Date: 02/28/2023 CLINICAL DATA:  Suspected pulmonary embolism. EXAM: CT ANGIOGRAPHY CHEST WITH CONTRAST TECHNIQUE: Multidetector CT imaging of the chest was performed using the standard protocol during bolus administration of intravenous contrast. Multiplanar CT image reconstructions and MIPs were obtained to evaluate the vascular anatomy. RADIATION DOSE REDUCTION: This exam was performed according to the departmental dose-optimization program which includes automated exposure control, adjustment of the mA and/or kV according to patient size and/or use of iterative reconstruction technique. CONTRAST:  75mL OMNIPAQUE IOHEXOL 350 MG/ML SOLN COMPARISON:  October 06, 2022 FINDINGS: Cardiovascular: The thoracic aorta is normal in appearance. Satisfactory opacification of the pulmonary arteries to the segmental level. No evidence of pulmonary embolism. Normal heart size. No pericardial effusion. Mediastinum/Nodes: No enlarged mediastinal, hilar, or axillary lymph nodes. Thyroid gland, trachea, and esophagus demonstrate no significant findings. Lungs/Pleura: There is no evidence of acute infiltrate. Mild to moderate severity predominately central and perihilar peribronchial thickening is noted. No pleural effusion or pneumothorax. Upper Abdomen: Multiple surgical clips are seen within the gallbladder fossa. Musculoskeletal: No chest wall abnormality. No acute or significant osseous findings. Review of the MIP images confirms the above findings. IMPRESSION: 1. No evidence of pulmonary embolism. 2. Mild to moderate severity bronchitis. 3. Evidence of prior cholecystectomy. Electronically Signed   By: Aram Candela M.D.   On: 02/28/2023 21:30   DG Chest Portable 1 View Result Date: 02/28/2023 CLINICAL DATA:  Shortness of breath EXAM: PORTABLE CHEST 1 VIEW COMPARISON:  12/22/2022 FINDINGS: Numerous leads and wires project over the chest. Apical lordotic positioning. Midline  trachea. Cardiomegaly accentuated by AP portable technique. Mildly degraded exam due to AP portable technique and patient body habitus. No pleural effusion or pneumothorax. No congestive failure. IMPRESSION: Cardiomegaly without congestive failure. Electronically Signed   By: Jeronimo Greaves M.D.   On: 02/28/2023 17:53    EKG: Independently reviewed.  Normal sinus rhythm.  Assessment/Plan Principal Problem:   Syncope Active Problems:   HTN (hypertension)   MDD (major depressive disorder), recurrent severe, without psychosis (HCC)   CHF (congestive heart failure) (HCC)   Acute bronchitis   Influenza A   CAD S/P percutaneous coronary angioplasty    Syncope -     in the setting of influenza A.  Likely from weakness and fatigue and not eating well.  However since patient has history of CAD will monitor in telemetry check 2D echo. Acute bronchitis with influenza A has diffuse wheezing on exam.  CT scan also shows bronchitis changes.  Will continue with nebulizer and steroids.  Since patient has been having symptoms for 4 days did not start Tamiflu. CAD status post PCI recently followed with cardiologist.  Cardiology was planning 2D echo as outpatient.  Will check 2D echo. History of CHF presently not on any medication.  Received fluids in the ER.  Follow-up 2D echo. History of depression medication needs to be verified.  Denies any suicidal thoughts. Mild hypokalemia replace and recheck. History of seizures as per the patient.  Takes gabapentin.  Home medications needs to be reconciled.  Pregnancy screen is pending.   DVT prophylaxis: Lovenox. Code Status: Full code. Family Communication: Discussed with patient. Disposition Plan: Monitored bed. Consults called: None. Admission status: Observation.

## 2023-03-01 NOTE — Progress Notes (Signed)
  Progress Note   Patient: [name] [name] JXB:[id number] DOB: Jul 26, 1975 DOA: 02/28/2023     0 DOS: the patient was seen and examined on 03/01/2023   Brief hospital course: 48 year old woman presented with cough congestion and wheezing.  Admitted for severe bronchitis, wheezing, influenza A.  Also had an episode of syncope.  Consultants None   Procedures/Events None   Assessment and Plan: Acute bronchitis with influenza A  Severe bronchitis, coughing, short of breath on examination. Continue bronchodilators and steroids.  No Tamiflu given now 5 days since onset of symptoms.  No opportunity for discharge until breathing has improved.  Syncope  Likely vasovagal from influenza A and poor oral intake.  No further evaluation suggested.  Echocardiogram reassuring.  CAD status post PCI  Stable.  Depression  Mild hypokalemia Resolved.  History of seizures as per the patient.  Takes gabapentin.  Morbid obesity Body mass index is 65.32 kg/m.       Subjective:  Coughing a lot SOB  Physical Exam: Vitals:   03/01/23 0700 03/01/23 0810 03/01/23 0815 03/01/23 1030  BP: 128/85  131/85 117/71  Pulse: 75  65 (!) 56  Resp: 20  20 20   Temp:  99.1 F (37.3 C)  98.4 F (36.9 C)  TempSrc:  Oral    SpO2: 95%  91% 98%  Weight:      Height:       Physical Exam Vitals reviewed.  Constitutional:      General: She is not in acute distress.    Appearance: She is ill-appearing. She is not toxic-appearing.  Cardiovascular:     Rate and Rhythm: Normal rate and regular rhythm.     Heart sounds: No murmur heard. Pulmonary:     Effort: No respiratory distress.     Breath sounds: Wheezing present. No rhonchi or rales.     Comments: Mild to moderate increased respiratory effort Neurological:     Mental Status: She is alert.  Psychiatric:        Mood and Affect: Mood normal.        Behavior: Behavior normal.     Data Reviewed: CMP noted AST 227 ALT 370  Family  Communication: none  Disposition: Status is: Observation   Planned Discharge Destination: Home    Time spent: 20 minutes  Author: Brendia Sacks, MD 03/01/2023 11:21 AM  For on call review [email or web address].

## 2023-03-01 NOTE — Hospital Course (Addendum)
48 year old woman presented with cough congestion and wheezing.  Admitted for severe bronchitis, wheezing, influenza A.  Gradually improved.  Multiple episodes of syncope here.  Consultants Cardiology Psychiatry  Procedures/Events None

## 2023-03-01 NOTE — Progress Notes (Signed)
  Echocardiogram 2D Echocardiogram has been performed.  Leda Roys RDCS 03/01/2023, 10:18 AM

## 2023-03-02 DIAGNOSIS — J101 Influenza due to other identified influenza virus with other respiratory manifestations: Secondary | ICD-10-CM | POA: Diagnosis not present

## 2023-03-02 DIAGNOSIS — Z6841 Body Mass Index (BMI) 40.0 and over, adult: Secondary | ICD-10-CM | POA: Diagnosis not present

## 2023-03-02 DIAGNOSIS — J209 Acute bronchitis, unspecified: Secondary | ICD-10-CM | POA: Diagnosis not present

## 2023-03-02 MED ORDER — SENNA 8.6 MG PO TABS
1.0000 | ORAL_TABLET | Freq: Every day | ORAL | Status: DC
  Administered 2023-03-02 – 2023-03-08 (×5): 8.6 mg via ORAL
  Filled 2023-03-02 (×9): qty 1

## 2023-03-02 MED ORDER — POLYETHYLENE GLYCOL 3350 17 G PO PACK
17.0000 g | PACK | Freq: Two times a day (BID) | ORAL | Status: DC
  Administered 2023-03-02 – 2023-03-08 (×10): 17 g via ORAL
  Filled 2023-03-02 (×15): qty 1

## 2023-03-02 NOTE — Plan of Care (Signed)

## 2023-03-02 NOTE — Progress Notes (Signed)
  Progress Note   Patient: [name] [name] ZOX:[id number] DOB: 09/22/1975 DOA: 02/28/2023     1 DOS: the patient was seen and examined on 03/02/2023   Brief hospital course: 48 year old woman presented with cough congestion and wheezing.  Admitted for severe bronchitis, wheezing, influenza A.  Also had an episode of syncope.  Consultants None   Procedures/Events None    Assessment and Plan: Acute bronchitis with influenza A  Severe bronchitis, coughing, short of breath Slow to improve.  Continue bronchodilators and steroids.  No Tamiflu given now 5 days since onset of symptoms.   Possible discharge tomorrow.   Syncope  Likely vasovagal from influenza A and poor oral intake.  No further evaluation suggested.  Echocardiogram reassuring.  Elevated transaminases Check CMP in AM.  CAD status post PCI  Stable.   Depression   Mild hypokalemia Resolved.   History of seizures as per the patient.  Takes gabapentin.   Morbid obesity Body mass index is 65.32 kg/m.    Subjective:  Feels better but still coughing  Physical Exam: Vitals:   03/02/23 0834 03/02/23 1134 03/02/23 1254 03/02/23 1515  BP:   128/86   Pulse:   64   Resp:   20   Temp:   98 F (36.7 C)   TempSrc:      SpO2: 95% 99% 96% 98%  Weight:      Height:       Physical Exam Vitals reviewed.  Constitutional:      General: She is not in acute distress.    Appearance: She is not ill-appearing or toxic-appearing.  Cardiovascular:     Rate and Rhythm: Normal rate and regular rhythm.     Heart sounds: No murmur heard. Pulmonary:     Effort: No respiratory distress.     Breath sounds: Wheezing present. No rhonchi or rales.     Comments: Coughing a lot.  Mild increased respiratory effort. Neurological:     Mental Status: She is alert.  Psychiatric:        Mood and Affect: Mood normal.        Behavior: Behavior normal.     Data Reviewed: No new data  Family Communication: fiancee    Disposition: Status is: Inpatient Remains inpatient appropriate because: flu     Time spent: 20 minutes  Author: Brendia Sacks, MD 03/02/2023 4:29 PM  For on call review [email or web address].

## 2023-03-02 NOTE — Plan of Care (Signed)

## 2023-03-03 ENCOUNTER — Encounter (HOSPITAL_COMMUNITY): Payer: Self-pay | Admitting: Internal Medicine

## 2023-03-03 DIAGNOSIS — J209 Acute bronchitis, unspecified: Secondary | ICD-10-CM | POA: Diagnosis not present

## 2023-03-03 DIAGNOSIS — Z6841 Body Mass Index (BMI) 40.0 and over, adult: Secondary | ICD-10-CM | POA: Diagnosis not present

## 2023-03-03 DIAGNOSIS — J101 Influenza due to other identified influenza virus with other respiratory manifestations: Secondary | ICD-10-CM | POA: Diagnosis not present

## 2023-03-03 LAB — COMPREHENSIVE METABOLIC PANEL
ALT: 162 U/L — ABNORMAL HIGH (ref 0–44)
AST: 35 U/L (ref 15–41)
Albumin: 3 g/dL — ABNORMAL LOW (ref 3.5–5.0)
Alkaline Phosphatase: 111 U/L (ref 38–126)
Anion gap: 6 (ref 5–15)
BUN: 19 mg/dL (ref 6–20)
CO2: 25 mmol/L (ref 22–32)
Calcium: 8.8 mg/dL — ABNORMAL LOW (ref 8.9–10.3)
Chloride: 109 mmol/L (ref 98–111)
Creatinine, Ser: 0.68 mg/dL (ref 0.44–1.00)
GFR, Estimated: >60 mL/min (ref >60)
Glucose, Bld: 115 mg/dL — ABNORMAL HIGH (ref 70–99)
Potassium: 4.7 mmol/L (ref 3.5–5.1)
Sodium: 140 mmol/L (ref 135–145)
Total Bilirubin: 0.5 mg/dL (ref 0.0–1.2)
Total Protein: 7.1 g/dL (ref 6.5–8.1)

## 2023-03-03 LAB — GLUCOSE, CAPILLARY: Glucose-Capillary: 113 mg/dL — ABNORMAL HIGH (ref 70–99)

## 2023-03-03 MED ORDER — HYDRALAZINE HCL 20 MG/ML IJ SOLN
5.0000 mg | Freq: Once | INTRAMUSCULAR | Status: DC | PRN
  Filled 2023-03-03: qty 1

## 2023-03-03 MED ORDER — IPRATROPIUM-ALBUTEROL 0.5-2.5 (3) MG/3ML IN SOLN
3.0000 mL | Freq: Four times a day (QID) | RESPIRATORY_TRACT | Status: DC
  Administered 2023-03-03 – 2023-03-07 (×16): 3 mL via RESPIRATORY_TRACT
  Filled 2023-03-03 (×17): qty 3

## 2023-03-03 MED ORDER — HYDRALAZINE HCL 20 MG/ML IJ SOLN
5.0000 mg | Freq: Once | INTRAMUSCULAR | Status: AC | PRN
  Administered 2023-03-03: 5 mg via INTRAVENOUS

## 2023-03-03 NOTE — Progress Notes (Signed)
  Progress Note   Patient: [name] [name] EXB:[id number] DOB: [birth year] DOA: 02/28/2023     2 DOS: the patient was seen and examined on 03/03/2023   Brief hospital course: 48 year old woman presented with cough congestion and wheezing.  Admitted for severe bronchitis, wheezing, influenza A.  Also had an episode of syncope.  Consultants None   Procedures/Events None   Assessment and Plan: Acute bronchitis with influenza A  Severe bronchitis, coughing, short of breath Very slow to improve.  Continue bronchodilators and steroids.  No Tamiflu given now 5 days since onset of symptoms.     Syncope  Likely vasovagal from influenza A and poor oral intake.  No further evaluation suggested.  Echocardiogram reassuring. Repeat EKG today showed sinus rhythm with no acute changes independent review.  Multiple quick episodes of syncope today observed by staff. Further interview with patient she reports a diagnosis of vertigo at [facility] and multiple episodes of syncope over the years including more than 50 in the last year.  She does not drive. Taken together this is an acute on chronic issue, restart telemetry monitor.  I suspect this is benign in nature given chronicity, exacerbated by acute illness.   Elevated transaminases Trending down.  No further inpatient monitoring.   CAD status post PCI  Stable.   Depression   Mild hypokalemia Resolved.   History of seizures as per the patient.  Takes gabapentin.   Morbid obesity Body mass index is 65.32 kg/m.      Subjective:  Multiple episodes of syncope today witnessed by staff  Reports being diagnosed with vertigo at [facility] sometime ago, she has frequent episodes of syncope, more than 50 in the last year.  Physical Exam: Vitals:   03/03/23 0445 03/03/23 0658 03/03/23 1109 03/03/23 1451  BP:  (!) 144/90 (!) 164/110   Pulse:  (!) 50 89   Resp:  18 15   Temp:  98.2 F (36.8 C)    TempSrc:      SpO2: 97% 92% 97% 96%  Weight:       Height:       Physical Exam Vitals reviewed.  Constitutional:      General: She is not in acute distress.    Appearance: She is not ill-appearing or toxic-appearing.  Cardiovascular:     Rate and Rhythm: Normal rate and regular rhythm.     Heart sounds: No murmur heard. Pulmonary:     Effort: Pulmonary effort is normal. No respiratory distress.     Breath sounds: No wheezing, rhonchi or rales.  Neurological:     Mental Status: She is alert.  Psychiatric:        Mood and Affect: Mood is anxious.        Behavior: Behavior normal.    Data Reviewed: CMP noted, ALT trending down.  AST has normalized.  Alkaline phosphatase has normalized.  Family Communication: fiance at bedside  Disposition: Status is: Inpatient Remains inpatient appropriate because: bronchitis     Time spent: 20 minutes  Author: Brendia Sacks, MD 03/03/2023 6:26 PM  For on call review [email or web address].

## 2023-03-03 NOTE — TOC Initial Note (Signed)
Transition of Care [facility]) - Initial/Assessment Note    Patient Details  Name: [name] [name] MRN: [id number] Date of Birth: 01/31/1975  Transition of [facility]) CM/SW Contact:    Diona Browner, LCSW Phone Number: 03/03/2023, 2:58 PM  Clinical Narrative:                 Pt to be seen by PT, awaiting recommendation. TOC to follow for needs.    Barriers to Discharge: Homeless with medical needs, Transportation   Patient Goals and CMS Choice Patient states their goals for this hospitalization and ongoing recovery are:: return to baseline CMS Medicare.gov Compare Post Acute Care list provided to:: Patient Choice offered to / list presented to : Patient Julian ownership interest in [facility].provided to::  (NA)    Expected Discharge Plan and Services In-house Referral: Clinical Social Work     Living arrangements for the past 2 months: No permanent address, Homeless                                      Prior Living Arrangements/Services Living arrangements for the past 2 months: No permanent address, Homeless Lives with:: Self Patient language and need for interpreter reviewed:: Yes Do you feel safe going back to the place where you live?:  (NA- homeless)      Need for Family Participation in Patient Care: No (Comment) Care giver support system in place?: No (comment)   Criminal Activity/Legal Involvement Pertinent to Current Situation/Hospitalization: No - Comment as needed  Activities of Daily Living      Permission Sought/Granted                  Emotional Assessment Appearance:: Appears stated age Attitude/Demeanor/Rapport: Engaged Affect (typically observed): Accepting Orientation: : Oriented to Self, Oriented to Place, Oriented to  Time, Oriented to Situation Alcohol / Substance Use: Never Used Psych Involvement: No (comment)  Admission diagnosis:  Acute bronchitis [J20.9] Syncope [R55] Influenza [J11.1] Syncope, unspecified  syncope type [R55] Patient Active Problem List   Diagnosis Date Noted   Acute bronchitis 03/01/2023   Influenza A 03/01/2023   CAD S/P percutaneous coronary angioplasty 03/01/2023   BMI 60.0-69.9, adult (HCC) 03/01/2023   Morbid obesity with BMI of 60.0-69.9, adult (HCC) 03/01/2023   Syncope 02/28/2023   Mood disorder (HCC) 12/31/2022   Otitis externa 12/31/2022   PTSD (post-traumatic stress disorder)    Weakness 10/01/2022   Acute pain of left lower extremity 09/30/2022   Asthma, chronic 09/30/2022   CHF (congestive heart failure) (HCC) 09/30/2022   MDD (major depressive disorder), recurrent severe, without psychosis (HCC) 09/24/2022   Back pain 04/21/2013   HTN (hypertension) 04/21/2013   PCP:  Annett Fabian, MD Pharmacy:   [facility] [id number], [place] - [address] [place] [address] Phone: [phone] Fax: [phone]     Social Drivers of Health (SDOH) Social History: SDOH Screenings   Food Insecurity: No Food Insecurity (03/01/2023)  Housing: High Risk (03/01/2023)  Transportation Needs: Unmet Transportation Needs (03/01/2023)  Utilities: Not At Risk (03/01/2023)  Alcohol Screen: Low Risk  (09/24/2022)  Depression (PHQ2-9): High Risk (12/31/2022)  Social Connections: Unknown (06/11/2021)   Received from [facility]  Tobacco Use: Low Risk  (03/01/2023)   SDOH Interventions:     Readmission Risk Interventions    03/03/2023    2:55 PM  Readmission Risk Prevention Plan  Transportation Screening Complete  Medication Review Oceanographer) Complete  PCP or Specialist appointment within 3-5 days of discharge Complete  HRI or Home Care Consult Complete  SW Recovery Care/Counseling Consult Complete  Palliative Care Screening Complete  Skilled Nursing Facility Complete

## 2023-03-03 NOTE — Progress Notes (Signed)
Physical Therapist alerted staff that pt had 3 syncope episodes while on toilet. RR was called and MD paged. See ICU RN note.

## 2023-03-03 NOTE — Significant Event (Signed)
Rapid Response Event Note   Reason for Call :  Syncope   Initial Focused Assessment:  Patient had lost consciousness while working with PT. Per PT patient was able to ambulate fine to the bathroom. Once patient was seated on toilet she began to pass out. PT witnessed 3 occurences of syncope. BP remained stable and didn't drop while seated on toilet. Staffing helped stand a pivot patient onto wheel chair. While patient was seated on wheelchair patients eyes began to shake, went crossed, and she lost consciousness. Patients pulse remained regular and +2 while patient was not conscious. Patient regained consciousness 10 seconds later. Staff helped slide patient onto bed. No further events were witnessed. Doctor Irene Limbo at bedside.   Interventions:  CBG= 113 EKG = Sinus rhythm in 60's  Purwick placed  Telemetry ordered   MD Notified: Doctor Irene Limbo Call Time: 1045 Arrival Time: 1055 End Time: 1130  Teresita Madura, RN

## 2023-03-03 NOTE — Plan of Care (Signed)

## 2023-03-03 NOTE — Evaluation (Signed)
Physical Therapy Evaluation Patient Details Name: [name] [name] MRN: [id number] DOB: September 30, 1975 Today's Date: 03/03/2023  History of Present Illness  48 yo female presents to therapy following hospital admission on 02/28/2023 due to cough, congestion, wheezing and N and V with poor PO intake as well as syncopal episode. Pt was found to have the flu and bronchitis. Pt has had several hospitalizations in the past 6 months. Pt PMH includes but is not limited to: CAD s/p PCI, CHF, depression, seizures, bipolar, vertigo, asthma, falls, PTSD, LBP, and cancer.  Clinical Impression   Pt admitted with above diagnosis.  Pt currently with functional limitations due to the deficits listed below (see PT Problem List). Pt in bed when PT arrived. Pt eager to participate with therapy and states she is feeling better. Pt has hx of syncope with an episode since admission and PT assessed for orthostatic hypotension, please see below.   Pt required CGA and increased time with use of hospital bed for supine to sit, CGA for sit to stand  from EOB and CGA for amb from EOB to bathroom with RW and min cues 15 feet. No report of dizziness or light headedness with any positional transitions. Pt able to void bladder seated on commode and then reported that the room was spinning, pt subsequently had the first of 3 episodes of LOC seated on commode lasting in duration from 10-30s. Pt required cues for orientation and event once recovered. Pt indicated diplopia and ongoing spinning of room while in bathroom. With first attempt to assist pt from commode resulted in the second episode of LOC, the 3rd episode pt seated on commode. Pt required max A for safety for transition from commode to transport wc in bathroom. Pt wheeled to bed side and proceeded to have one more LOC episode for a total of 4 ~ 30 mins. Pt required staff A to return pt to bed. Pt left in bed with nursing staff and significant other present. 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 Pt will benefit from ongoing therapy services at time of d/c. Pt will benefit from acute skilled PT to increase their independence and safety with mobility to allow discharge.     Bp at rest semi reclined in bed 146/102 (63 PR) Bp seated EOB 164.123 (70 PR) Bp immediate standing 158/126 ( 77 PR) Bp s/p 3 min standing 154/98 (57 PR) no indication of dizziness or  light headedness, nor reports of spinning  Bp once returned to bed s/p 4 LOC episodes 164/110 (89 PR)        If plan is discharge home, recommend the following: A little help with walking and/or transfers;A little help with bathing/dressing/bathroom;Assistance with cooking/housework;Assist for transportation   Can travel by private Data processing manager (4 wheels)  Recommendations for Other Services       Functional Status Assessment Patient has had a recent decline in their functional status and demonstrates the ability to make significant improvements in function in a reasonable and predictable amount of time.     Precautions / Restrictions Precautions Precautions: Fall (LOC) Restrictions Weight Bearing Restrictions Per Provider Order: No      Mobility  Bed Mobility Overal bed mobility: Needs Assistance Bed Mobility: Supine to Sit     Supine to sit: HOB elevated, Used rails, Contact guard     General bed mobility  comments: increased time and cues    Transfers Overall transfer level: Needs assistance Equipment used: Rolling walker (2 wheels) Transfers: Sit to/from Stand Sit to Stand: Contact guard assist, Max assist           General transfer comment: pt able to perform sit to stand from EOB with CGA and min cues, following 3 LOC epiodes on commode pt required max A for safe transition to transport wc. once in transport wc pt had one additional LOC episode     Ambulation/Gait Ambulation/Gait assistance: Contact guard assist Gait Distance (Feet): 15 Feet Assistive device: Rolling walker (2 wheels) Gait Pattern/deviations: Step-to pattern, Wide base of support Gait velocity: decreased     General Gait Details: min cues, no overt instabilty noted  Stairs            Wheelchair Mobility     Tilt Bed    Modified Rankin (Stroke Patients Only)       Balance Overall balance assessment: Needs assistance Sitting-balance support: Feet supported Sitting balance-Leahy Scale: Good     Standing balance support: Bilateral upper extremity supported, During functional activity, Reliant on assistive device for balance Standing balance-Leahy Scale: Poor                               Pertinent Vitals/Pain Pain Assessment Pain Assessment: No/denies pain    Home Living Family/patient expects to be discharged to:: Shelter/Homeless                   Additional Comments: IRC/shelter (?) in [place]    Prior Function Prior Level of Function : Independent/Modified Independent             Mobility Comments: pt reports using walking stick for ambulation, frequent falls, long distance between tiny home and bathroom in which pt is required to navigate on a variety of uneven surfaces ~ 50 feet, ramp to enter tiny home ADLs Comments: reports IND with all ADLs and self care tasks     Extremity/Trunk Assessment        Lower Extremity Assessment Lower Extremity Assessment: Generalized weakness    Cervical / Trunk Assessment Cervical / Trunk Assessment: Normal  Communication   Communication Communication: No apparent difficulties  Cognition Arousal: Alert Behavior During Therapy: WFL for tasks assessed/performed Overall Cognitive Status: Within Functional Limits for tasks assessed                                          General Comments      Exercises     Assessment/Plan    PT  Assessment Patient needs continued PT services  PT Problem List Decreased strength;Decreased activity tolerance;Decreased balance;Decreased coordination;Decreased mobility;Cardiopulmonary status limiting activity       PT Treatment Interventions DME instruction;Gait training;Functional mobility training;Therapeutic activities;Therapeutic exercise;Balance training;Neuromuscular re-education;Patient/family education    PT Goals (Current goals can be found in the Care Plan section)  Acute Rehab PT Goals Patient Stated Goal: to be able to not fall PT Goal Formulation: With patient Time For Goal Achievement: 03/17/23 Potential to Achieve Goals: Fair    Frequency Min 1X/week     Co-evaluation               AM-PAC PT "6 Clicks" Mobility  Outcome Measure Help needed turning from your back to your side while in a  flat bed without using bedrails?: A Little Help needed moving from lying on your back to sitting on the side of a flat bed without using bedrails?: A Little Help needed moving to and from a bed to a chair (including a wheelchair)?: A Little Help needed standing up from a chair using your arms (e.g., wheelchair or bedside chair)?: A Little Help needed to walk in hospital room?: A Little Help needed climbing 3-5 steps with a railing? : Total 6 Click Score: 16    End of Session         PT Visit Diagnosis: Unsteadiness on feet (R26.81);Other abnormalities of gait and mobility (R26.89);Repeated falls (R29.6);Muscle weakness (generalized) (M62.81);Difficulty in walking, not elsewhere classified (R26.2)    Time: 1030-1109 PT Time Calculation (min) (ACUTE ONLY): 39 min   Charges:   PT Evaluation $PT Eval Moderate Complexity: 1 Mod PT Treatments $Gait Training: 8-22 mins $Therapeutic Activity: 8-22 mins PT General Charges $$ ACUTE PT VISIT: 1 Visit         Johnny Bridge, PT Acute Rehab   Jacqualyn Posey 03/03/2023, 3:11 PM

## 2023-03-04 DIAGNOSIS — J101 Influenza due to other identified influenza virus with other respiratory manifestations: Secondary | ICD-10-CM | POA: Diagnosis not present

## 2023-03-04 DIAGNOSIS — Z6841 Body Mass Index (BMI) 40.0 and over, adult: Secondary | ICD-10-CM | POA: Diagnosis not present

## 2023-03-04 DIAGNOSIS — J209 Acute bronchitis, unspecified: Secondary | ICD-10-CM | POA: Diagnosis not present

## 2023-03-04 DIAGNOSIS — R55 Syncope and collapse: Secondary | ICD-10-CM | POA: Diagnosis not present

## 2023-03-04 MED ORDER — METHYLPREDNISOLONE SODIUM SUCC 40 MG IJ SOLR
40.0000 mg | INTRAMUSCULAR | Status: DC
  Administered 2023-03-05 – 2023-03-08 (×4): 40 mg via INTRAVENOUS
  Filled 2023-03-04 (×4): qty 1

## 2023-03-04 NOTE — Plan of Care (Signed)
  Problem: Education: Goal: Knowledge of General Education information will improve Description: Including pain rating scale, medication(s)/side effects and non-pharmacologic comfort measures Outcome: Progressing   Problem: Clinical Measurements: Goal: Ability to maintain clinical measurements within normal limits will improve Outcome: Progressing   Problem: Clinical Measurements: Goal: Will remain free from infection Outcome: Progressing   Problem: Nutrition: Goal: Adequate nutrition will be maintained Outcome: Progressing   

## 2023-03-04 NOTE — Discharge Instructions (Addendum)
 Please see attached resources for housing and transportation needs.   _____ From Dr Delsie: Dear [name] [name],  Most effective treatment for your mental health disease involves BOTH a psychiatrist AND a therapist Psychiatrist to manage medications Therapist to help identify personal goals, barriers from those goals, and plan to achieve those goals by understanding emotions Please make regular appointments with an outpatient psychiatrist and other doctors once you leave the hospital (if any, otherwise, please see below for resources to make an appointment).  For therapy outside the hospital, please ask for these specific types of therapy: DBT ________________________________________________________  SAFETY CRISIS  Dial 988 for National Suicide & Crisis Lifeline    Text [phone] for Crisis Text Line:     [facility] URGENT CARE:  [address] FIRST FLOOR.  [place], [place] [address].  [phone]  Mobile Crisis Response Teams Listed by counties in vicinity of [facility] providers [facility] [phone] [facility] [phone] [facility] [phone] [facility] [phone] [place]                * [facility] [phone]                * [employer] [phone] [facility] [phone] [facility]  [phone] * [employer] [phone] ________________________________________________________  To see which pharmacy near you is the CHEAPEST for certain medications, please use GoodRx. It is free website and has a free phone app.    Also consider looking at [facility] $4.00 or [facility]'s $7.00 prescription list. Both are free to view if googled [facility]4 prescription and [facility]'s $7 prescription. These are set  prices, no insurance required. [facility] low cost medications: $4-$15 for 30days prescriptions or $10-$38 for 90days prescriptions  ________________________________________________________  Difficulties with sleep?   Can also use this free app for insomnia called CBT-I. Let your doctors and therapists know so they can help with extra tips and tricks or for guidance and accountability. NO ADDS on the app.     ________________________________________________________  Non-Emergent / Urgent  [facility] [address], SECOND FLOOR [place], [place] [address] [phone] OUTPATIENT Walk-in information: Please note, all walk-ins are first come & first serve, with limited number of availability.  Please note that to be eligible for services you must bring: ID or a piece of mail with your name [facility] address  Therapist for therapy:  Monday & Wednesdays: Please ARRIVE at 7:15 AM for registration Will START at 8:00 AM Every 1st & 2nd Friday of the month: Please ARRIVE at 10:15 AM for registration Will START at 1 PM - 5 PM  Psychiatrist for medication management: Monday - Friday:  Please ARRIVE at 7:15 AM for registration Will START at 8:00 AM  Regretfully, due to limited availability, please be aware that you may not been seen on the same day as walk-in. Please consider making an appoint or try again. Thank you for your patience and understanding.  Outpatient Therapy and Psychiatry Resources for Patients: Here are a series of links for finding a therapist.    Includes links to the following: [facility] (http://wilson-mayo.com/) (only for [facility] and please reserve for uninsured) [facility]  (http://blankenship-martinez.net/) Psychology Today Tax Adviser (https://www.psychologytoday.com/us lendell) Psychology Today Support Group Tax Inspector (https://www.psychologytoday.com/us /groups/) [employer] [facility] [employer] Location (https://carolinabehavioralcare.com/staff-location/[place]/) [facility] -  Support Group Finder - (recorddebt.fi) Family Services of the [employer] (https://fspcares.org/contact/) [employer] for Mental Health [place] - [facility] (https://namiguilford.org/support-and-education/support-groups/) [employer] [facility] - Affiliated with [facility] (https://www.[place].com/lb/locations/profile/[facility]-at-[place]-reed-drive/) Dept of Health and Human Services - Find a mental health facility (http://lester.info/) -----  Psychotherapy for PTSD Post-traumatic stress disorder (PTSD) can be effectively treated with several types of psychotherapy. Here are the most effective therapies, ranked by the strength of the evidence supporting them: Eye Movement Desensitization and Reprocessing (EMDR): EMDR has shown strong evidence for reducing PTSD symptoms both immediately after treatment and in the long term. It involves recalling traumatic memories while making specific eye movements, which helps process and reduce the distress associated with these memories. Cognitive Processing Therapy (CPT): CPT is a type of cognitive-behavioral therapy (CBT) that focuses on changing negative thoughts and beliefs related to the trauma. It has been shown to be highly effective in reducing PTSD symptoms and maintaining these improvements over time. Prolonged Exposure (PE): PE is another form of CBT that involves repeatedly discussing the traumatic event in a safe environment to reduce the power of the trauma over time. It has strong evidence supporting its effectiveness in reducing PTSD symptoms. Trauma-Focused Cognitive  Behavioral Therapy (TF-CBT): This therapy combines elements of cognitive therapy and exposure therapy to help patients process and reduce the impact of traumatic memories. It is particularly effective in both short-term and long-term symptom reduction. Narrative Exposure Therapy (NET): NET involves creating a detailed narrative of the traumatic event and has been shown to be effective in reducing PTSD symptoms, especially in populations with multiple traumas. Present-Centered Therapy (PCT): PCT focuses on current life problems and stressors rather than directly addressing the trauma. It has moderate evidence supporting its effectiveness in reducing PTSD symptoms.  Each of these therapies has been shown to be effective in treating PTSD, but the best choice depends on individual patient needs and preferences. Discuss these options with your healthcare provider to determine the most suitable therapy for you.

## 2023-03-04 NOTE — Progress Notes (Signed)
 Progress Note   Patient: [name] [name] FMW:[id number] DOB: 26-May-1975 DOA: 02/28/2023     3 DOS: the patient was seen and examined on 03/04/2023   Brief hospital course: 48 year old woman presented with cough congestion and wheezing.  Admitted for severe bronchitis, wheezing, influenza A.  Gradually improved.  Multiple episodes of syncope here.  Consultants Cardiology Psychiatry  Procedures/Events None   Assessment and Plan: Acute bronchitis with influenza A  Severe bronchitis, coughing, short of breath Very slow to improve.  Continue bronchodilators and steroids.  No Tamiflu given now 5 days since onset of symptoms.      Syncope  Likely vasovagal from influenza A and poor oral intake.  Echocardiogram reassuring. Repeat EKG showed sinus rhythm with no acute changes independent review.  Multiple episodes of syncope observed by staff 2/3. Further interview with patient she reports a diagnosis of vertigo at [facility] and multiple episodes of syncope over the years including more than 50 in the last year which she reported 2/3, but today she reports perhaps once per month.  She does not drive. No events noted on telemetry.  Favor benign etiology.  Will check orthostatics, Place TED hose.  No arrhythmias captured.  Patient requested cardiology consultation.   Elevated transaminases Trending down.  No further inpatient monitoring.   CAD status post PCI  Stable.   Depression Bipolar Borderline schizophrenia Patient reporting auditory hallucinations, requesting psychiatry consultation   Mild hypokalemia Resolved.   History of seizures as per the patient.  Takes gabapentin .   Morbid obesity Body mass index is 65.32 kg/m.  Precipitating reason for admission was bronchitis this appears stable at this time.  Also reported episode of syncope prior to admission, multiple episodes here.  Hopefully home in the next 48 hours pending cardiology evaluation.     Subjective:   Syncope today per physical therapy Patient reports multiple episodes of syncope.  Yesterday she reported 50 in the last year, today she reports it is more like once per month.  Tends to be positional in nature, both lightheaded and room spinning.  Reports being evaluated at [facility] many years ago with workup that may have included a tilt table. Still coughing  Physical Exam: Vitals:   03/04/23 0600 03/04/23 0852 03/04/23 1540 03/04/23 1550  BP: 138/88  (!) 145/92   Pulse: (!) 51  62   Resp:   16   Temp: 97.8 F (36.6 C)  97.6 F (36.4 C)   TempSrc:      SpO2: 95% 96% 91% 96%  Weight:      Height:       Physical Exam Vitals reviewed.  Constitutional:      General: She is not in acute distress.    Appearance: She is not ill-appearing or toxic-appearing.  Cardiovascular:     Rate and Rhythm: Normal rate and regular rhythm.     Heart sounds: No murmur heard. Pulmonary:     Effort: Pulmonary effort is normal. No respiratory distress.     Breath sounds: Wheezing present. No rhonchi or rales.  Neurological:     Mental Status: She is alert.  Psychiatric:        Mood and Affect: Mood normal.        Behavior: Behavior normal.     Data Reviewed: No new data  Family Communication: none  Disposition: Status is: Inpatient Remains inpatient appropriate because: bronchitis, syncope     Time spent: 20 minutes  Author: Toribio Door, MD 03/04/2023 6:38 PM  For on  call review [email or web address].

## 2023-03-04 NOTE — Progress Notes (Signed)
 Physical Therapy Treatment Patient Details Name: [name] [name] MRN: [id number] DOB: 08-Aug-1975 Today's Date: 03/04/2023   History of Present Illness 48 yo female presents to therapy following hospital admission on 02/28/2023 due to cough, congestion, wheezing and N and V with poor PO intake as well as syncopal episode. Pt was found to have the flu and bronchitis. Pt has had several hospitalizations in the past 6 months. Pt PMH includes but is not limited to: CAD s/p PCI, CHF, depression, seizures, bipolar, vertigo, asthma, falls, PTSD, LBP, and cancer.    PT Comments  Pt agreeable to working with therapy. She requested to use [facility] before beginning PT session. Offered to have nursing come back in to place her on purewick but pt declined stating she had to take a break from the purewick 2* irritation. She requested assistance onto [facility] during PT session. Explained to pt that we would have to move slowly and spend time in sitting EOB and static standing at bedside before getting to BSC-pt agreeable to this. Once EOB, pt stated woah and her trunk went posteriorly-tech behind to help stabilize her and threrapist in front to block knees. Pt did not have syncopal episode but she did c/o dizziness. Cues for pt to try to keep eyes open and to focus on one spot. No nystagmus observed. Sat EOB for several minutes until pt felt like dizziness resolved enough to attempt standing.  Cues for safety, breathing, slow rise. Stood EOB for at least 1 minute with CGA. Pt then stated I need to get over to [facility] or Im gonna wet my pants. Pt was able to perform stand pivot with RW over to [facility].  Once seated, cued pt to keep eyes open, focus on 1 spot with eyes, to take slow breaths, and to avoid straining. Pt reported she was still experiencing some dizziness. Pt succesfully used BSC and stated she would need assistance to wipe once ready. Pt stated she was ready to stand up. With tech on 1 side and therapist on opposite side  prepared to assist with hygiene, pt stood from commode with close CGA +2 for safety with both therapist and tech holding gait belt. As pt stood and rested forearms on walker handles, she stated woah and then went into slumped position with LOC. With use of gait belt, tech and therapist held patient in slumped position before slowly lowering her to floor. Pt positioned in long sitting and supported. Pt came to fairly quickly and stated what happened...oh no. 2 RNs in to assist therapist and tech with geting pt off floor with maxi-move lift equipment. Assisted pt into bed with RN still in room.   If plan is discharge home, recommend the following: A lot of help with bathing/dressing/bathroom;Two people to help with walking and/or transfers;Assist for transportation   Can travel by private vehicle        Equipment Recommendations   (continuing to assess-may need to consider WC if ability to safely mobilize doesn't improve)    Recommendations for Other Services       Precautions / Restrictions Precautions Precautions: Fall Precaution Comments: syncopal episodes Restrictions Weight Bearing Restrictions Per Provider Order: No     Mobility  Bed Mobility Overal bed mobility: Needs Assistance Bed Mobility: Supine to Sit     Supine to sit: Min assist, +2 for safety/equipment     General bed mobility comments: Min A on today-pt stated she could not move her leg without assistance due to chronic deficits. (on yesterday,  pt was CGA for bed mobility per note). Assist for L LE only initially. Slow incremental movements until LEs off and pt able to sit up. Once EOB, pt stated woah and her trunk went posteriorly-tech behind to help stabilize her and threrapist in front to block knees. Pt did not have syncopal episode but she did c/o dizziness. Cues for pt to try to keep eyes open and to focus on one spot. No nystagmus observed. Sat EOB for several minutes until pt felt like dizziness resolved  enough to attempt standing.    Transfers Overall transfer level: Needs assistance Equipment used: Rolling walker (2 wheels) Transfers: Sit to/from Stand Sit to Stand: Contact guard assist, +2 safety/equipment           General transfer comment: Cues for safety, breathing, slow rise. Stood EOB for at least 1 minute with CGA. Pt then stated I need to get over to [facility] or Im gonna wet my pants. Pt was able to perform stand pivot with RW over to [facility].  Once seated, cued pt to keep eyes open, focus on 1 spot with eyes, to take slow breaths, and to avoid straining. Pt reported she was still experiencing some dizziness. Pt succesfully used BSC and stated she would need assistance to wipe once ready. Pt stated she was ready to stand up. With tech on 1 side and therapist on opposite side prepared to assist with hygiene, pt stood from commode with close CGA +2 for safety with both therapist and tech holding gait belt. As pt stood and rested forearms on walker handles, she stated woah and then went into slumped position with LOC. With use of gait belt, tech and therapist held patient in slumped position before slowly lowering her to floor. Pt positioned in long sitting and supported. Pt came to fairly quickly and stated what happened...oh no. 2 RNs in to assist therapist and tech with geting pt off floor with maxi-move lift equipment. Assisted pt into bed with RN still in room.    Ambulation/Gait                   Stairs             Wheelchair Mobility     Tilt Bed    Modified Rankin (Stroke Patients Only)       Balance Overall balance assessment: Needs assistance, History of Falls Sitting-balance support: Feet supported Sitting balance-Leahy Scale: Good                                      Cognition Arousal: Alert Behavior During Therapy: WFL for tasks assessed/performed Overall Cognitive Status: Within Functional Limits for tasks assessed                                           Exercises      General Comments        Pertinent Vitals/Pain Pain Assessment Pain Assessment: No/denies pain    Home Living                          Prior Function            PT Goals (current goals can now be found in the care plan section) Progress towards PT goals: Not  progressing toward goals - comment (limited by repeated syncopal episodes)    Frequency    Min 1X/week      PT Plan      Co-evaluation              AM-PAC PT 6 Clicks Mobility   Outcome Measure  Help needed turning from your back to your side while in a flat bed without using bedrails?: A Little Help needed moving from lying on your back to sitting on the side of a flat bed without using bedrails?: A Little Help needed moving to and from a bed to a chair (including a wheelchair)?: A Lot Help needed standing up from a chair using your arms (e.g., wheelchair or bedside chair)?: A Little Help needed to walk in hospital room?: A Lot Help needed climbing 3-5 steps with a railing? : Total 6 Click Score: 14    End of Session Equipment Utilized During Treatment: Gait belt Activity Tolerance:  (limited by continued syncopal episodes) Patient left: in bed;with call bell/phone within reach;with bed alarm set   PT Visit Diagnosis: History of falling (Z91.81);Repeated falls (R29.6);Other abnormalities of gait and mobility (R26.89);Difficulty in walking, not elsewhere classified (R26.2);Dizziness and giddiness (R42)     Time: 8868-8793 PT Time Calculation (min) (ACUTE ONLY): 35 min  Charges:    $Therapeutic Activity: 23-37 mins PT General Charges $$ ACUTE PT VISIT: 1 Visit                         Dannial SQUIBB, PT Acute Rehabilitation  Office: [phone]

## 2023-03-05 DIAGNOSIS — J101 Influenza due to other identified influenza virus with other respiratory manifestations: Secondary | ICD-10-CM | POA: Diagnosis not present

## 2023-03-05 DIAGNOSIS — R55 Syncope and collapse: Secondary | ICD-10-CM | POA: Diagnosis not present

## 2023-03-05 DIAGNOSIS — F4310 Post-traumatic stress disorder, unspecified: Secondary | ICD-10-CM

## 2023-03-05 DIAGNOSIS — R440 Auditory hallucinations: Secondary | ICD-10-CM

## 2023-03-05 LAB — BASIC METABOLIC PANEL
Anion gap: 9 (ref 5–15)
BUN: 21 mg/dL — ABNORMAL HIGH (ref 6–20)
CO2: 25 mmol/L (ref 22–32)
Calcium: 8.7 mg/dL — ABNORMAL LOW (ref 8.9–10.3)
Chloride: 105 mmol/L (ref 98–111)
Creatinine, Ser: 0.43 mg/dL — ABNORMAL LOW (ref 0.44–1.00)
GFR, Estimated: >60 mL/min (ref >60)
Glucose, Bld: 85 mg/dL (ref 70–99)
Potassium: 4.5 mmol/L (ref 3.5–5.1)
Sodium: 139 mmol/L (ref 135–145)

## 2023-03-05 LAB — PHOSPHORUS: Phosphorus: 3.4 mg/dL (ref 2.5–4.6)

## 2023-03-05 LAB — MAGNESIUM: Magnesium: 2.4 mg/dL (ref 1.7–2.4)

## 2023-03-05 MED ORDER — GUAIFENESIN ER 600 MG PO TB12
1200.0000 mg | ORAL_TABLET | Freq: Two times a day (BID) | ORAL | Status: DC
  Administered 2023-03-05 – 2023-03-10 (×11): 1200 mg via ORAL
  Filled 2023-03-05 (×11): qty 2

## 2023-03-05 MED ORDER — PRAZOSIN HCL 1 MG PO CAPS
1.0000 mg | ORAL_CAPSULE | Freq: Every day | ORAL | Status: DC
  Administered 2023-03-05 – 2023-03-09 (×5): 1 mg via ORAL
  Filled 2023-03-05 (×5): qty 1

## 2023-03-05 MED ORDER — ARFORMOTEROL TARTRATE 15 MCG/2ML IN NEBU
15.0000 ug | INHALATION_SOLUTION | Freq: Two times a day (BID) | RESPIRATORY_TRACT | Status: DC
  Administered 2023-03-05 – 2023-03-10 (×10): 15 ug via RESPIRATORY_TRACT
  Filled 2023-03-05 (×10): qty 2

## 2023-03-05 MED ORDER — AZITHROMYCIN 250 MG PO TABS
500.0000 mg | ORAL_TABLET | Freq: Every day | ORAL | Status: AC
  Administered 2023-03-05 – 2023-03-09 (×5): 500 mg via ORAL
  Filled 2023-03-05 (×5): qty 2

## 2023-03-05 MED ORDER — BUDESONIDE 0.25 MG/2ML IN SUSP
0.2500 mg | Freq: Two times a day (BID) | RESPIRATORY_TRACT | Status: DC
  Administered 2023-03-05 – 2023-03-10 (×10): 0.25 mg via RESPIRATORY_TRACT
  Filled 2023-03-05 (×10): qty 2

## 2023-03-05 MED ORDER — ARIPIPRAZOLE 5 MG PO TABS
5.0000 mg | ORAL_TABLET | Freq: Every day | ORAL | Status: DC
  Administered 2023-03-05 – 2023-03-06 (×2): 5 mg via ORAL
  Filled 2023-03-05 (×2): qty 1

## 2023-03-05 MED ORDER — MELATONIN 3 MG PO TABS
3.0000 mg | ORAL_TABLET | Freq: Every day | ORAL | Status: DC
  Administered 2023-03-05 – 2023-03-09 (×5): 3 mg via ORAL
  Filled 2023-03-05 (×5): qty 1

## 2023-03-05 MED ORDER — ASPIRIN 81 MG PO TBEC
81.0000 mg | DELAYED_RELEASE_TABLET | Freq: Every day | ORAL | Status: DC
  Administered 2023-03-05 – 2023-03-10 (×6): 81 mg via ORAL
  Filled 2023-03-05 (×6): qty 1

## 2023-03-05 NOTE — Plan of Care (Signed)
  Problem: Nutrition: Goal: Adequate nutrition will be maintained Outcome: Progressing   Problem: Activity: Goal: Risk for activity intolerance will decrease Outcome: Progressing   Problem: Pain Managment: Goal: General experience of comfort will improve and/or be controlled Outcome: Progressing   Problem: Safety: Goal: Ability to remain free from injury will improve Outcome: Progressing

## 2023-03-05 NOTE — Consult Note (Signed)
 Cardiology Consultation   Patient ID: [name] [name] MRN: [id number]; DOB: 1975/12/26  Admit date: 02/28/2023 Date of Consult: 03/05/2023  PCP:  Gregary Sharper, MD   [facility] Providers Cardiologist:  Darryle ONEIDA Decent, MD      Patient Profile:   [name] [name] is a 48 y.o. female with a hx of CAD, depression, bipolar disorder, asthma, CHF who is being seen 03/05/2023 for the evaluation of syncope at the request of Dr. Madelyne.  History of Present Illness:   [name] is a 48 year old female with above medical history.  She was seen by Dr. Decent in 12/2022 for evaluation of CAD.  Patient reported that she had a cardiac stent placed when she was 48 years old, but had not had her heart checked in years.  She reported a history of 1 major heart attack, 1 major stroke, followed by 2 minor strokes.  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  Has also been told that she has CHF and a large heart for years.  When she saw Dr. Decent, she complained of intermittent sharp chest discomfort that was nonexertional, radiated to her back.  Felt to be acid reflux.  She was restarted on aspirin  and Lipitor.  An echocardiogram was ordered but was never completed.  Patient presented to the ED on 1/31 complaining of flulike symptoms and fatigue that has been going on for 2 days.  Also reported having a syncopal episode.  In the emergency room, patient was found to be positive for influenza A.  High-sensitivity troponin negative.  BNP 30.9.  Chest x-ray showed cardiomegaly without congestive heart failure.  CTA chest showed no evidence of PE, mild-moderate bronchitis.EKG showed sinus rhythm with heart rate 76 bpm, left posterior fascicular block.  EKG overall unchanged when compared to EKG from 12/2022.  Patient was admitted to the internal medicine service for acute bronchitis with influenza A.  Patient reported syncopal episodes, felt to be vasovagal from influenza A and poor oral  intake.  She underwent echocardiogram on 03/01/2023 that showed EF 60-65%, no regional wall motion abnormalities, normal RV function, no significant valvular abnormalities.  On 2/3, patient was working with physical therapy.  While sitting on the toilet, she began to lose consciousness.  BP remained stable.  EKG showed sinus rhythm with heart rate 62 bpm.  Patient reported having a diagnosis of vertigo in the past, reports multiple episodes of syncope in the past. Estimates having 12 in the past year.   Today, patient reports that she is feeling better than she has been over the past couple of days.  Reports that she initially came to the hospital on 2/1 after she had a syncopal episode.  She had been having significant diarrhea for 4 days.  Also had significant nausea and was unable to eat eat anything.  She tried to drink water, but noted that it went right through her.  Also noted having fever, chills, body aches and a cough.  On 2/1, she had walked to the bathroom and broke out in a cold sweat.  She considered calling an ambulance, but decided to try to take the bus instead.  She was walking to the bus when she had lightheadedness and lost consciousness.  When she woke up, the ambulance was there to take her to the hospital.  Reports feeling significantly dehydrated at that time and having low blood pressure when she arrived to the ED.  She has since been given IV fluids.  She was also found to have acute bronchitis with influenza A and was treated with bronchodilators and steroids.  Continues to feel very wheezy, but her breathing is improving.  She worked with physical therapy on 2/3.  Notes that she stood up from the toilet and felt very dizzy and began to pass out.  She again became dizzy upon standing with physical therapy on 2/4.   Past Medical History:  Diagnosis Date   Asthma    Bipolar 1 disorder (HCC)    Cancer (HCC)    CHF (congestive heart failure) (HCC)    Coronary artery disease     Depression    Hypertension    Obesity    PTSD (post-traumatic stress disorder)    Seizures (HCC)     Past Surgical History:  Procedure Laterality Date   CARDIAC CATHETERIZATION     CESAREAN SECTION     CHOLECYSTECTOMY       Home Medications:  Prior to Admission medications   Medication Sig Start Date End Date Taking? Authorizing Provider  albuterol  (PROVENTIL  HFA;VENTOLIN  HFA) 108 (90 BASE) MCG/ACT inhaler Inhale 2 puffs into the lungs every 6 (six) hours as needed for wheezing or shortness of breath (SOB).   Yes [provider]  ARIPiprazole  (ABILIFY ) 15 MG tablet Take 1 tablet (15 mg total) by mouth daily for 14 days. 10/02/22 03/02/23 Yes Lee, Jacqueline Eun, NP  atorvastatin  (LIPITOR) 40 MG tablet Take 1 tablet (40 mg total) by mouth daily. 01/17/23  Yes O'Neal, Darryle Ned, MD  FLUoxetine  (PROZAC ) 40 MG capsule Take 1 capsule (40 mg total) by mouth daily for 14 days. 10/02/22 03/02/23 Yes Lee, Jacqueline Eun, NP  gabapentin  (NEURONTIN ) 600 MG tablet Take 600 mg by mouth 3 (three) times daily.   Yes [provider]  methocarbamol  (ROBAXIN ) 500 MG tablet Take 1 tablet (500 mg total) by mouth 2 (two) times daily. 02/23/23  Yes Charlyn Sora, MD  oxyCODONE -acetaminophen  (PERCOCET/ROXICET) 5-325 MG tablet Take 1 tablet by mouth every 8 (eight) hours as needed for severe pain (pain score 7-10). 02/23/23  Yes Charlyn Sora, MD  pantoprazole  (PROTONIX ) 40 MG tablet Take 1 tablet (40 mg total) by mouth daily. 01/17/23  Yes O'Neal, Darryle Ned, MD  prazosin  (MINIPRESS ) 5 MG capsule Take 1 capsule (5 mg total) by mouth at bedtime for 14 days. 12/31/22 03/02/23 Yes Gregary Sharper, MD  topiramate  (TOPAMAX ) 100 MG tablet Take 100 mg by mouth daily.   Yes [provider]  zolpidem  (AMBIEN ) 5 MG tablet Take 1 tablet (5 mg total) by mouth at bedtime as needed for up to 14 days for sleep. 10/01/22 03/02/23 Yes Lee, Jacqueline Eun, NP    Inpatient Medications: Scheduled Meds:   atorvastatin   40 mg Oral Daily   benzonatate   200 mg Oral TID   enoxaparin  (LOVENOX ) injection  80 mg Subcutaneous Q24H   gabapentin   600 mg Oral TID   ipratropium-albuterol   3 mL Nebulization Q6H   methylPREDNISolone  (SOLU-MEDROL ) injection  40 mg Intravenous Q24H   polyethylene glycol  17 g Oral BID   senna  1 tablet Oral QHS   Continuous Infusions:  PRN Meds: acetaminophen  **OR** acetaminophen , albuterol , guaiFENesin , oxyCODONE -acetaminophen   Allergies:    Allergies  Allergen Reactions   Bee Venom Anaphylaxis and Rash   Sulfa Antibiotics Anaphylaxis   Ultram  [Tramadol ] Other (See Comments)    Migraines    Social History:   Social History   Socioeconomic History   Marital status: Single    Spouse  name: Not on file   Number of children: 7   Years of education: Not on file   Highest education level: Not on file  Occupational History   Occupation: Disabled  Tobacco Use   Smoking status: Never   Smokeless tobacco: Never  Vaping Use   Vaping status: Never Used  Substance and Sexual Activity   Alcohol use: Yes    Comment: occ   Drug use: No   Sexual activity: Not on file  Other Topics Concern   Not on file  Social History Narrative   Not on file   Social Drivers of Health   Financial Resource Strain: Not on file  Food Insecurity: No Food Insecurity (03/01/2023)   Hunger Vital Sign    Worried About Running Out of Food in the Last Year: Never true    Ran Out of Food in the Last Year: Never true  Transportation Needs: No Transportation Needs (03/04/2023)   PRAPARE - Administrator, Civil Service (Medical): No    Lack of Transportation (Non-Medical): No  Recent Concern: Transportation Needs - Unmet Transportation Needs (03/01/2023)   PRAPARE - Administrator, Civil Service (Medical): Yes    Lack of Transportation (Non-Medical): Yes  Physical Activity: Not on file  Stress: Not on file  Social Connections: Unknown (06/11/2021)   Received from  [facility], [facility]   Social Network    Social Network: Not on file  Intimate Partner Violence: Not At Risk (03/01/2023)   Humiliation, Afraid, Rape, and Kick questionnaire    Fear of Current or Ex-Partner: No    Emotionally Abused: No    Physically Abused: No    Sexually Abused: No    Family History:   History reviewed. No pertinent family history.   ROS:  Please see the history of present illness.   All other ROS reviewed and negative.     Physical Exam/Data:   Vitals:   03/04/23 1929 03/04/23 2024 03/05/23 0253 03/05/23 0507  BP: (!) 143/103   130/85  Pulse: 67   (!) 51  Resp: 18   18  Temp: 98.4 F (36.9 C)   98.7 F (37.1 C)  TempSrc:      SpO2: 93% 96% 97% 94%  Weight:      Height:        Intake/Output Summary (Last 24 hours) at 03/05/2023 0740 Last data filed at 03/05/2023 0617 Gross per 24 hour  Intake --  Output 1400 ml  Net -1400 ml      02/28/2023    4:09 PM 02/23/2023    2:13 PM 01/17/2023   10:47 AM  Last 3 Weights  Weight (lbs) 360 lb 0.2 oz 360 lb 0.2 oz 360 lb  Weight (kg) 163.3 kg 163.3 kg 163.295 kg     Body mass index is 65.32 kg/m.  General: Obese middle-aged female.  Laying comfortably in the bed with head elevated HEENT: normal Neck: no JVD Vascular: Radial pulses 2+ bilaterally Cardiac:  normal S1, S2; RRR; no murmur  Lungs: Expiratory wheezing heard throughout.  Normal work of breathing on room air.  Patient has a productive cough abd: soft, non tender Musculoskeletal:  No deformities Skin: warm and dry  Neuro:   no focal abnormalities noted Psych:  Normal affect   EKG:  The EKG was personally reviewed and demonstrates:   EKG from 03/03/23 showed sinus rhythm, HR 62 BPM  Telemetry:  Telemetry was personally reviewed and demonstrates: Sinus rhythm, sinus  bradycardia.  Heart rate high 40s-60s  Relevant CV Studies: Cardiac Studies & Procedures      ECHOCARDIOGRAM  ECHOCARDIOGRAM COMPLETE 03/01/2023  Narrative ECHOCARDIOGRAM  REPORT    Patient Name:   [name] [name] Date of Exam: 03/01/2023 Medical Rec #:  [id number]          Height:       62.2 in Accession #:    [id number]         Weight:       360.0 lb Date of Birth:  23-Nov-1975          BSA:          2.461 m Patient Age:    47 years           BP:           121/81 mmHg Patient Gender: F                  HR:           55 bpm. Exam Location:  Inpatient  Procedure: 2D Echo, Color Doppler and Cardiac Doppler  Indications:    Syncope R55  History:        Patient has no prior history of Echocardiogram examinations. CHF, CAD; Risk Factors:Hypertension.  Sonographer:    Tinnie Gosling RDCS Referring Phys: 31 ARSHAD N KAKRAKANDY  IMPRESSIONS   1. Left ventricular ejection fraction, by estimation, is 60 to 65%. The left ventricle has normal function. The left ventricle has no regional wall motion abnormalities. Left ventricular diastolic parameters are indeterminate. 2. Right ventricular systolic function is normal. The right ventricular size is mildly enlarged. Tricuspid regurgitation signal is inadequate for assessing PA pressure. 3. The mitral valve is normal in structure. No evidence of mitral valve regurgitation. No evidence of mitral stenosis. 4. The aortic valve was not well visualized. Aortic valve regurgitation is trivial. No aortic stenosis is present. 5. Aortic dilatation noted. There is mild dilatation of the ascending aorta, measuring 41 mm.  Comparison(s): No prior Echocardiogram.  FINDINGS Left Ventricle: Left ventricular ejection fraction, by estimation, is 60 to 65%. The left ventricle has normal function. The left ventricle has no regional wall motion abnormalities. The left ventricular internal cavity size was normal in size. There is no left ventricular hypertrophy. Left ventricular diastolic parameters are indeterminate.  Right Ventricle: The right ventricular size is mildly enlarged. No increase in right ventricular wall thickness.  Right ventricular systolic function is normal. Tricuspid regurgitation signal is inadequate for assessing PA pressure.  Left [facility]: Left atrial size was normal in size.  Right [facility]: Right atrial size was normal in size.  Pericardium: There is no evidence of pericardial effusion.  Mitral Valve: The mitral valve is normal in structure. No evidence of mitral valve regurgitation. No evidence of mitral valve stenosis.  Tricuspid Valve: The tricuspid valve is normal in structure. Tricuspid valve regurgitation is not demonstrated. No evidence of tricuspid stenosis.  Aortic Valve: The aortic valve was not well visualized. Aortic valve regurgitation is trivial. No aortic stenosis is present.  Pulmonic Valve: The pulmonic valve was not well visualized. Pulmonic valve regurgitation is not visualized. No evidence of pulmonic stenosis.  Aorta: Aortic dilatation noted and the aortic root is normal in size and structure. There is mild dilatation of the ascending aorta, measuring 41 mm.  Venous: The inferior vena cava was not well visualized.  IAS/Shunts: The interatrial septum was not well visualized.   LEFT VENTRICLE PLAX 2D LVIDd:  5.50 cm   Diastology LVIDs:         3.50 cm   LV e' medial:    5.11 cm/s LV PW:         1.10 cm   LV E/e' medial:  18.6 LV IVS:        1.10 cm   LV e' lateral:   7.29 cm/s LVOT diam:     2.30 cm   LV E/e' lateral: 13.0 LV SV:         111 LV SV Index:   45 LVOT Area:     4.15 cm   RIGHT VENTRICLE TAPSE (M-mode): 2.4 cm  LEFT ATRIUM             Index LA diam:        3.90 cm 1.58 cm/m LA Vol (A2C):   58.1 ml 23.61 ml/m LA Vol (A4C):   52.3 ml 21.25 ml/m LA Biplane Vol: 57.6 ml 23.41 ml/m AORTIC VALVE LVOT Vmax:   128.00 cm/s LVOT Vmean:  94.800 cm/s LVOT VTI:    0.266 m  AORTA Ao Root diam: 3.50 cm Ao Asc diam:  4.10 cm  MITRAL VALVE MV Area (PHT): 2.97 cm    SHUNTS MV Decel Time: 255 msec    Systemic VTI:  0.27 m MV E velocity: 94.90  cm/s  Systemic Diam: 2.30 cm MV A velocity: 94.90 cm/s MV E/A ratio:  1.00  Vishnu Priya Mallipeddi Electronically signed by Diannah Late Mallipeddi Signature Date/Time: 03/01/2023/10:54:04 AM    Final              Laboratory Data:  High Sensitivity Troponin:   Recent Labs  Lab 02/28/23 1720  TROPONINIHS 6     Chemistry Recent Labs  Lab 03/01/23 0652 03/03/23 0748 03/05/23 0536  NA 137 140 139  K 3.6 4.7 4.5  CL 106 109 105  CO2 23 25 25   GLUCOSE 172* 115* 85  BUN 14 19 21*  CREATININE 0.63 0.68 0.43*  CALCIUM  8.6* 8.8* 8.7*  MG 2.2  --  2.4  GFRNONAA >60 >60 >60  ANIONGAP 8 6 9     Recent Labs  Lab 03/01/23 0652 03/03/23 0748  PROT 7.5 7.1  ALBUMIN 3.3* 3.0*  AST 227* 35  ALT 370* 162*  ALKPHOS 168* 111  BILITOT 1.0 0.5   Lipids No results for input(s): CHOL, TRIG, HDL, LABVLDL, LDLCALC, CHOLHDL in the last 168 hours.  Hematology Recent Labs  Lab 02/28/23 1630 03/01/23 0652  WBC 5.5 2.6*  RBC 4.99 4.72  HGB 15.0 14.4  HCT 46.0 43.6  MCV 92.2 92.4  MCH 30.1 30.5  MCHC 32.6 33.0  RDW 13.6 13.6  PLT 184 169   Thyroid  Recent Labs  Lab 03/01/23 0652  TSH 0.760    BNP Recent Labs  Lab 02/28/23 1720  BNP 30.9    DDimer  Recent Labs  Lab 02/28/23 1720  DDIMER 1.46*     Radiology/Studies:  ECHOCARDIOGRAM COMPLETE Result Date: 03/01/2023    ECHOCARDIOGRAM REPORT   Patient Name:   [name] [name] Date of Exam: 03/01/2023 Medical Rec #:  [id number]          Height:       62.2 in Accession #:    [id number]         Weight:       360.0 lb Date of Birth:  [birth year]          BSA:  2.461 m Patient Age:    47 years           BP:           121/81 mmHg Patient Gender: F                  HR:           55 bpm. Exam Location:  Inpatient Procedure: 2D Echo, Color Doppler and Cardiac Doppler Indications:    Syncope R55  History:        Patient has no prior history of Echocardiogram examinations.                 CHF, CAD; Risk  Factors:Hypertension.  Sonographer:    Tinnie Gosling RDCS Referring Phys: 70 ARSHAD N KAKRAKANDY IMPRESSIONS  1. Left ventricular ejection fraction, by estimation, is 60 to 65%. The left ventricle has normal function. The left ventricle has no regional wall motion abnormalities. Left ventricular diastolic parameters are indeterminate.  2. Right ventricular systolic function is normal. The right ventricular size is mildly enlarged. Tricuspid regurgitation signal is inadequate for assessing PA pressure.  3. The mitral valve is normal in structure. No evidence of mitral valve regurgitation. No evidence of mitral stenosis.  4. The aortic valve was not well visualized. Aortic valve regurgitation is trivial. No aortic stenosis is present.  5. Aortic dilatation noted. There is mild dilatation of the ascending aorta, measuring 41 mm. Comparison(s): No prior Echocardiogram. FINDINGS  Left Ventricle: Left ventricular ejection fraction, by estimation, is 60 to 65%. The left ventricle has normal function. The left ventricle has no regional wall motion abnormalities. The left ventricular internal cavity size was normal in size. There is  no left ventricular hypertrophy. Left ventricular diastolic parameters are indeterminate. Right Ventricle: The right ventricular size is mildly enlarged. No increase in right ventricular wall thickness. Right ventricular systolic function is normal. Tricuspid regurgitation signal is inadequate for assessing PA pressure. Left [facility]: Left atrial size was normal in size. Right [facility]: Right atrial size was normal in size. Pericardium: There is no evidence of pericardial effusion. Mitral Valve: The mitral valve is normal in structure. No evidence of mitral valve regurgitation. No evidence of mitral valve stenosis. Tricuspid Valve: The tricuspid valve is normal in structure. Tricuspid valve regurgitation is not demonstrated. No evidence of tricuspid stenosis. Aortic Valve: The aortic valve was not  well visualized. Aortic valve regurgitation is trivial. No aortic stenosis is present. Pulmonic Valve: The pulmonic valve was not well visualized. Pulmonic valve regurgitation is not visualized. No evidence of pulmonic stenosis. Aorta: Aortic dilatation noted and the aortic root is normal in size and structure. There is mild dilatation of the ascending aorta, measuring 41 mm. Venous: The inferior vena cava was not well visualized. IAS/Shunts: The interatrial septum was not well visualized.  LEFT VENTRICLE PLAX 2D LVIDd:         5.50 cm   Diastology LVIDs:         3.50 cm   LV e' medial:    5.11 cm/s LV PW:         1.10 cm   LV E/e' medial:  18.6 LV IVS:        1.10 cm   LV e' lateral:   7.29 cm/s LVOT diam:     2.30 cm   LV E/e' lateral: 13.0 LV SV:         111 LV SV Index:   45 LVOT Area:  4.15 cm  RIGHT VENTRICLE TAPSE (M-mode): 2.4 cm LEFT ATRIUM             Index LA diam:        3.90 cm 1.58 cm/m LA Vol (A2C):   58.1 ml 23.61 ml/m LA Vol (A4C):   52.3 ml 21.25 ml/m LA Biplane Vol: 57.6 ml 23.41 ml/m  AORTIC VALVE LVOT Vmax:   128.00 cm/s LVOT Vmean:  94.800 cm/s LVOT VTI:    0.266 m  AORTA Ao Root diam: 3.50 cm Ao Asc diam:  4.10 cm MITRAL VALVE MV Area (PHT): 2.97 cm    SHUNTS MV Decel Time: 255 msec    Systemic VTI:  0.27 m MV E velocity: 94.90 cm/s  Systemic Diam: 2.30 cm MV A velocity: 94.90 cm/s MV E/A ratio:  1.00 Vishnu Priya Mallipeddi Electronically signed by Diannah Late Mallipeddi Signature Date/Time: 03/01/2023/10:54:04 AM    Final      Assessment and Plan:   Syncope  -Patient presented to the ED after she had a syncopal episode.  She had been having diarrhea and poor oral intake for the past 4 days prior to episode.  Had a prodrome of dizziness, diaphoresis.  No chest pain. -Strongly suspect that her syncopal episode was orthostatic versus vasovagal with influenza A and poor oral intake.  - EKG without ischemic changes.  - Echocardiogram showed EF 60-65%, no regional wall motion  abnormalities, normal RV function, no significant valvular abnormalities -Patient has continued to have episodes of dizziness/syncope while working with PT.  Symptoms occur when going from sitting to standing. -Orthostatic vital signs pending -No significant arrhythmias noted on telemetry.  She does become bradycardic with heart rate into the upper 40s when sleeping.  Admits to having a history of untreated sleep apnea. -Reports having about 12 episodes of syncope over the past year.  With recurrent syncope, could consider an implantable loop recorder.  Could also consider cardiac monitor.  However she has had syncopal episodes while attached to telemetry and no significant arrhythmias were noted.  Overall, I have a low suspicion that episodes are cardiac. -She admits to having history of vertigo that has been followed by [facility].  Likely vertigo is contributing to symptoms  History of CAD  - Patient reports having a stent placed to her heart when she was 18  - Reports occasional episodes of chest discomfort that occur randomly. Not associated with exertion nor relieved by rest. Pain is sharp. She believes pain may be related to GERD  - EKG without ischemic changes. hsTn negative on arrival to the ED - Echo this admission showed EF 60-65%, no wall motion abnormalities  - No further ischemic workup indicated at this time   OSA -Patient previously diagnosed with OSA and was told to wear CPAP.  Reports that she did not tolerate her CPAP, also machine got stolen. -Consider outpatient sleep study  Otherwise Per Primary  - Acute Bronchitis with influenza a - Elevated transaminases  - Depression - Bipolar  - Borderline Schizophrenia  - History of seizures  - Morbid obesity    Risk Assessment/Risk Scores:    For questions or updates, please contact  [facility] Please consult [email or web address] for contact info under    Signed, Rollo FABIENE Louder, PA-C  03/05/2023 7:40 AM

## 2023-03-05 NOTE — Plan of Care (Signed)
  Problem: Education: Goal: Knowledge of General Education information will improve Description: Including pain rating scale, medication(s)/side effects and non-pharmacologic comfort measures Outcome: Progressing   Problem: Clinical Measurements: Goal: Will remain free from infection Outcome: Progressing   Problem: Clinical Measurements: Goal: Respiratory complications will improve Outcome: Progressing   Problem: Activity: Goal: Risk for activity intolerance will decrease Outcome: Progressing

## 2023-03-05 NOTE — Progress Notes (Signed)
 PROGRESS NOTE    [name] [name]  FMW:[id number] DOB: 10/25/1975 DOA: 02/28/2023 PCP: Gregary Sharper, MD   Brief Narrative: 48 year old with past medical history significant for CAD s/p PCI, CHF, depression, history of seizure , who presents with cough, congestion, wheezing admitted for severe bronchitis, found to have influenza A.  Found to have multiple episode of syncope in the hospital   Assessment & Plan:   Principal Problem:   Influenza A Active Problems:   HTN (hypertension)   MDD (major depressive disorder), recurrent severe, without psychosis (HCC)   CHF (congestive heart failure) (HCC)   Syncope   Acute bronchitis   CAD S/P percutaneous coronary angioplasty   BMI 60.0-69.9, adult (HCC)   Morbid obesity with BMI of 60.0-69.9, adult (HCC)   1-Acute bronchitis with influenza A -Did not receive Tamiflu given onset of symptoms more than 5 days. Continue with IV Solu-Medrol  -Continue with the scheduled DuoNeb -Start guaifenesin , Brovana  and Pulmicort   2-Syncope: Likely vasovagal in the setting of poor oral intake dehydration and influenza Echo without significant abnormalities. Reported episode of syncope observed by staff onto his last 3 No events noticed on telemetry Orthostatic vitals ordered, cardiology has been consulted as well  Transaminases: In the setting of viral illness  CAD status post PCI stable  Depression, bipolar, borderline schizophrenia: Psych  was consulted patient reporter auditory hallucination  Mild hypokalemia: Resolved  Seizure: Takes gabapentin   Morbid obesity BMI 65: Need lifestyle modification     Estimated body mass index is 65.32 kg/m as calculated from the following:   Height as of this encounter: 5' 2.25 (1.581 m).   Weight as of this encounter: 163.3 kg.   DVT prophylaxis: Lovenox  Code Status: Full code Family Communication: Care discussed with patient Disposition Plan:  Status is: Inpatient Remains inpatient  appropriate because: Management of acute bronchitis and recurrent syncope    Consultants:  Cardiology  Procedures:  Echo  Antimicrobials:    Subjective: Patient reports breathing a little bit better still having wheezing and feel congested.  He has not been out of the bed today.  Objective: Vitals:   03/04/23 1929 03/04/23 2024 03/05/23 0253 03/05/23 0507  BP: (!) 143/103   130/85  Pulse: 67   (!) 51  Resp: 18   18  Temp: 98.4 F (36.9 C)   98.7 F (37.1 C)  TempSrc:      SpO2: 93% 96% 97% 94%  Weight:      Height:        Intake/Output Summary (Last 24 hours) at 03/05/2023 0738 Last data filed at 03/05/2023 0617 Gross per 24 hour  Intake --  Output 1400 ml  Net -1400 ml   Filed Weights   02/28/23 1609  Weight: (!) 163.3 kg    Examination:  General exam: Appears calm and comfortable  Respiratory system: Clear to auscultation. Respiratory effort normal. Cardiovascular system: S1 & S2 heard, RRR. No JVD, murmurs, rubs, gallops or clicks. No pedal edema. Gastrointestinal system: Abdomen is nondistended, soft and nontender. No organomegaly or masses felt. Normal bowel sounds heard. Central nervous system: Alert and oriented. No focal neurological deficits. Extremities: Symmetric 5 x 5 power.    Data Reviewed: I have personally reviewed following labs and imaging studies  CBC: Recent Labs  Lab 02/28/23 1630 03/01/23 0652  WBC 5.5 2.6*  NEUTROABS  --  1.5*  HGB 15.0 14.4  HCT 46.0 43.6  MCV 92.2 92.4  PLT 184 169   Basic Metabolic Panel: Recent Labs  Lab 02/28/23 1630 03/01/23 0652 03/03/23 0748 03/05/23 0536  NA 137 137 140 139  K 3.1* 3.6 4.7 4.5  CL 102 106 109 105  CO2 21* 23 25 25   GLUCOSE 90 172* 115* 85  BUN 18 14 19  21*  CREATININE 0.71 0.63 0.68 0.43*  CALCIUM  8.6* 8.6* 8.8* 8.7*  MG  --  2.2  --  2.4  PHOS  --   --   --  3.4   GFR: Estimated Creatinine Clearance: 128.5 mL/min (A) (by C-G formula based on SCr of 0.43 mg/dL  (L)). Liver Function Tests: Recent Labs  Lab 03/01/23 0652 03/03/23 0748  AST 227* 35  ALT 370* 162*  ALKPHOS 168* 111  BILITOT 1.0 0.5  PROT 7.5 7.1  ALBUMIN 3.3* 3.0*   No results for input(s): LIPASE, AMYLASE in the last 168 hours. No results for input(s): AMMONIA in the last 168 hours. Coagulation Profile: No results for input(s): INR, PROTIME in the last 168 hours. Cardiac Enzymes: No results for input(s): CKTOTAL, CKMB, CKMBINDEX, TROPONINI in the last 168 hours. BNP (last 3 results) No results for input(s): PROBNP in the last 8760 hours. HbA1C: No results for input(s): HGBA1C in the last 72 hours. CBG: Recent Labs  Lab 02/28/23 1652 03/03/23 1056  GLUCAP 86 113*   Lipid Profile: No results for input(s): CHOL, HDL, LDLCALC, TRIG, CHOLHDL, LDLDIRECT in the last 72 hours. Thyroid Function Tests: No results for input(s): TSH, T4TOTAL, FREET4, T3FREE, THYROIDAB in the last 72 hours. Anemia Panel: No results for input(s): VITAMINB12, FOLATE, FERRITIN, TIBC, IRON, RETICCTPCT in the last 72 hours. Sepsis Labs: No results for input(s): PROCALCITON, LATICACIDVEN in the last 168 hours.  Recent Results (from the past 240 hours)  SARS Coronavirus 2 by RT PCR (hospital order, performed in [facility] hospital lab) *cepheid single result test* Anterior Nasal Swab     Status: None   Collection Time: 02/28/23  4:35 PM   Specimen: Anterior Nasal Swab  Result Value Ref Range Status   SARS Coronavirus 2 by RT PCR NEGATIVE NEGATIVE Final    Comment: (NOTE) SARS-CoV-2 target nucleic acids are NOT DETECTED.  The SARS-CoV-2 RNA is generally detectable in upper and lower respiratory specimens during the acute phase of infection. The lowest concentration of SARS-CoV-2 viral copies this assay can detect is 250 copies / mL. A negative result does not preclude SARS-CoV-2 infection and should not be used as the sole basis for  treatment or other patient management decisions.  A negative result may occur with improper specimen collection / handling, submission of specimen other than nasopharyngeal swab, presence of viral mutation(s) within the areas targeted by this assay, and inadequate number of viral copies (<250 copies / mL). A negative result must be combined with clinical observations, patient history, and epidemiological information.  Fact Sheet for Patients:   roadlaptop.co.za  Fact Sheet for Healthcare Providers: http://kim-miller.com/  This test is not yet approved or  cleared by the [place]  FDA and has been authorized for detection and/or diagnosis of SARS-CoV-2 by FDA under an Emergency Use Authorization (EUA).  This EUA will remain in effect (meaning this test can be used) for the duration of the COVID-19 declaration under Section 564(b)(1) of the Act, 21 U.S.C. section 360bbb-3(b)(1), unless the authorization is terminated or revoked sooner.  Performed at [facility], [address], [place], [place] [address]   Resp panel by RT-PCR (RSV, Flu A&B, Covid) Anterior Nasal Swab     Status:  Abnormal   Collection Time: 02/28/23  7:27 PM   Specimen: Anterior Nasal Swab  Result Value Ref Range Status   SARS Coronavirus 2 by RT PCR NEGATIVE NEGATIVE Final    Comment: (NOTE) SARS-CoV-2 target nucleic acids are NOT DETECTED.  The SARS-CoV-2 RNA is generally detectable in upper respiratory specimens during the acute phase of infection. The lowest concentration of SARS-CoV-2 viral copies this assay can detect is 138 copies/mL. A negative result does not preclude SARS-Cov-2 infection and should not be used as the sole basis for treatment or other patient management decisions. A negative result may occur with  improper specimen collection/handling, submission of specimen other than nasopharyngeal swab, presence of viral mutation(s)  within the areas targeted by this assay, and inadequate number of viral copies(<138 copies/mL). A negative result must be combined with clinical observations, patient history, and epidemiological information. The expected result is Negative.  Fact Sheet for Patients:  bloggercourse.com  Fact Sheet for Healthcare Providers:  seriousbroker.it  This test is no t yet approved or cleared by the [place]  FDA and  has been authorized for detection and/or diagnosis of SARS-CoV-2 by FDA under an Emergency Use Authorization (EUA). This EUA will remain  in effect (meaning this test can be used) for the duration of the COVID-19 declaration under Section 564(b)(1) of the Act, 21 U.S.C.section 360bbb-3(b)(1), unless the authorization is terminated  or revoked sooner.       Influenza A by PCR POSITIVE (A) NEGATIVE Final   Influenza B by PCR NEGATIVE NEGATIVE Final    Comment: (NOTE) The Xpert Xpress SARS-CoV-2/FLU/RSV plus assay is intended as an aid in the diagnosis of influenza from Nasopharyngeal swab specimens and should not be used as a sole basis for treatment. Nasal washings and aspirates are unacceptable for Xpert Xpress SARS-CoV-2/FLU/RSV testing.  Fact Sheet for Patients: bloggercourse.com  Fact Sheet for Healthcare Providers: seriousbroker.it  This test is not yet approved or cleared by the [place]  FDA and has been authorized for detection and/or diagnosis of SARS-CoV-2 by FDA under an Emergency Use Authorization (EUA). This EUA will remain in effect (meaning this test can be used) for the duration of the COVID-19 declaration under Section 564(b)(1) of the Act, 21 U.S.C. section 360bbb-3(b)(1), unless the authorization is terminated or revoked.     Resp Syncytial Virus by PCR NEGATIVE NEGATIVE Final    Comment: (NOTE) Fact Sheet for  Patients: bloggercourse.com  Fact Sheet for Healthcare Providers: seriousbroker.it  This test is not yet approved or cleared by the [place]  FDA and has been authorized for detection and/or diagnosis of SARS-CoV-2 by FDA under an Emergency Use Authorization (EUA). This EUA will remain in effect (meaning this test can be used) for the duration of the COVID-19 declaration under Section 564(b)(1) of the Act, 21 U.S.C. section 360bbb-3(b)(1), unless the authorization is terminated or revoked.  Performed at [facility], [address], [place], [place] [address]          Radiology Studies: No results found.      Scheduled Meds:  atorvastatin   40 mg Oral Daily   benzonatate   200 mg Oral TID   enoxaparin  (LOVENOX ) injection  80 mg Subcutaneous Q24H   gabapentin   600 mg Oral TID   ipratropium-albuterol   3 mL Nebulization Q6H   methylPREDNISolone  (SOLU-MEDROL ) injection  40 mg Intravenous Q24H   polyethylene glycol  17 g Oral BID   senna  1 tablet Oral QHS   Continuous Infusions:   LOS:  4 days    Time spent: 35 minutes.     Owen DELENA Lore, MD [facility]   If 7PM-7AM, please contact night-coverage [email or web address]  03/05/2023, 7:38 AM

## 2023-03-05 NOTE — Progress Notes (Signed)
 Physical Therapy Treatment Patient Details Name: [name] [name] MRN: [id number] DOB: [birth year] Today's Date: 03/05/2023   History of Present Illness 48 yo female presents to therapy following hospital admission on 02/28/2023 due to cough, congestion, wheezing and N and V with poor PO intake as well as syncopal episode. Pt was found to have the flu and bronchitis. Pt has had several hospitalizations in the past 6 months. Pt PMH includes but is not limited to: CAD s/p PCI, CHF, depression, seizures, bipolar, vertigo, asthma, falls, PTSD, LBP, and cancer.    PT Comments  Patient is eager to get up  into recliner.Patient moved slowly through transitional positions. Patient reports dizziness, not spinning. Patient noted to  have head turns and looking around, no  nystagmus. Patient did report increase in dizziness with  head turns/eyes stabile. Patient tolerated all orthostatic BP tests and able to step to recliner with RW.  No  syncopal/near syncopal Symptoms. Patient left seated upright in recliner. BP 146/95(104). Continue progressive mobility, ambulation as tolerated. Orthostatic BPs  Supine 138/82(99)-60  Sitting 131/89(100)-61  Sitting after 3  min 127/0(103)-62  Standing NT(BP malfx)   Standing after 3 min 155/105 (117)-76      If plan is discharge home, recommend the following: A lot of help with walking and/or transfers;A little help with bathing/dressing/bathroom;Assistance with cooking/housework;Assist for transportation;Help with stairs or ramp for entrance   Can travel by private vehicle        Equipment Recommendations   (continue to assess)    Recommendations for Other Services       Precautions / Restrictions Precautions Precautions: Fall Precaution Comments: syncopal episodes, no forwarning Restrictions Weight Bearing Restrictions Per Provider Order: No     Mobility  Bed Mobility Overal bed mobility: Needs Assistance Bed Mobility: Supine to Sit      Supine to sit: +2 for physical assistance, Min assist     General bed mobility comments: patient  able to move legs to bed edge, min assist  for trunk.  patient reports dizziness but  did not have a syncopal episode.    Transfers Overall transfer level: Needs assistance Equipment used: Rolling walker (2 wheels) Transfers: Sit to/from Stand, Bed to chair/wheelchair/BSC Sit to Stand: Contact guard assist, +2 safety/equipment   Step pivot transfers: +2 safety/equipment, Min assist       General transfer comment: cues for safety, move slow, limit head turns, noted wheezing. Orthostatic BP's taken sat for a short rest, then able to stand and pivot to recliner using RW. Mild sway  of trunk when moving.    Ambulation/Gait                   Stairs             Wheelchair Mobility     Tilt Bed    Modified Rankin (Stroke Patients Only)       Balance Overall balance assessment: Needs assistance, History of Falls Sitting-balance support: Feet supported Sitting balance-Leahy Scale: Good     Standing balance support: Bilateral upper extremity supported, During functional activity, Reliant on assistive device for balance Standing balance-Leahy Scale: Fair                              Cognition Arousal: Alert Behavior During Therapy: WFL for tasks assessed/performed Overall Cognitive Status: Within Functional Limits for tasks assessed  Exercises      General Comments        Pertinent Vitals/Pain Pain Assessment Pain Assessment: Faces Faces Pain Scale: Hurts even more Pain Location: back Pain Descriptors / Indicators: Aching Pain Intervention(s): Monitored during session    Home Living                          Prior Function            PT Goals (current goals can now be found in the care plan section) Progress towards PT goals: Progressing toward goals     Frequency    Min 1X/week      PT Plan      Co-evaluation              AM-PAC PT 6 Clicks Mobility   Outcome Measure  Help needed turning from your back to your side while in a flat bed without using bedrails?: None Help needed moving from lying on your back to sitting on the side of a flat bed without using bedrails?: A Little Help needed moving to and from a bed to a chair (including a wheelchair)?: A Little Help needed standing up from a chair using your arms (e.g., wheelchair or bedside chair)?: A Little Help needed to walk in hospital room?: Total Help needed climbing 3-5 steps with a railing? : Total 6 Click Score: 15    End of Session Equipment Utilized During Treatment: Gait belt Activity Tolerance: Patient tolerated treatment well Patient left: in chair;with call bell/phone within reach Nurse Communication: Mobility status PT Visit Diagnosis: History of falling (Z91.81);Repeated falls (R29.6);Other abnormalities of gait and mobility (R26.89);Difficulty in walking, not elsewhere classified (R26.2);Dizziness and giddiness (R42);Pain     Time: 8646-8574 PT Time Calculation (min) (ACUTE ONLY): 32 min  Charges:    $Therapeutic Activity: 23-37 mins PT General Charges $$ ACUTE PT VISIT: 1 Visit                     [name] Potters PT Acute Rehabilitation Services Office [phone] Weekend pager-[phone]    Potters [name] [name] 03/05/2023, 2:43 PM
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
## 2023-03-05 NOTE — Progress Notes (Signed)
 Physical Therapy Treatment Patient Details Name: [name] [name] MRN: [id number] DOB: October 04, 1975 Today's Date: 03/05/2023   History of Present Illness 48 yo female presents to therapy following hospital admission on 02/28/2023 due to cough, congestion, wheezing and N and V with poor PO intake as well as syncopal episode. Pt was found to have the flu and bronchitis. Pt has had several hospitalizations in the past 6 months. Pt PMH includes but is not limited to: CAD s/p PCI, CHF, depression, seizures, bipolar, vertigo, asthma, falls, PTSD, LBP, and cancer.    PT Comments  Patient has been sitting upright x ~ 1.75 hrs. No dizziness until  after transfer to [facility] then to bed.  Reported room spinning.  Patient BP 136/79 after transfers.  Continue progressive mobility as tolerated.   If plan is discharge home, recommend the following: A lot of help with walking and/or transfers;A little help with bathing/dressing/bathroom;Assistance with cooking/housework;Assist for transportation;Help with stairs or ramp for entrance   Can travel by private vehicle        Equipment Recommendations   (continue to assess)    Recommendations for Other Services       Precautions / Restrictions Precautions Precautions: Fall Precaution Comments: syncopal episodes, no forwarning Restrictions Weight Bearing Restrictions Per Provider Order: No     Mobility  Bed Mobility Overal bed mobility: Needs Assistance Bed Mobility: Sit to Supine      Sit to supine: Min assist   General bed mobility comments: support LLE onto bed    Transfers Overall transfer level: Needs assistance Equipment used: Rolling walker (2 wheels) Transfers: Bed to chair/wheelchair/BSC Sit to Stand: Contact guard assist, +2 safety/equipment   Step pivot transfers: +2 safety/equipment, Contact guard assist       General transfer comment: patient   stepped pivot 180* from recliner to [facility] then to bed.    Ambulation/Gait                    Stairs             Wheelchair Mobility     Tilt Bed    Modified Rankin (Stroke Patients Only)       Balance Overall balance assessment: Needs assistance, History of Falls Sitting-balance support: Feet supported Sitting balance-Leahy Scale: Good     Standing balance support: Bilateral upper extremity supported, During functional activity Standing balance-Leahy Scale: Fair                              Cognition Arousal: Alert Behavior During Therapy: WFL for tasks assessed/performed Overall Cognitive Status: Within Functional Limits for tasks assessed                                          Exercises      General Comments        Pertinent Vitals/Pain Pain Assessment Pain Assessment: No/denies pain Faces Pain Scale: Hurts even more Pain Location: back Pain Descriptors / Indicators: Aching Pain Intervention(s): Monitored during session    Home Living                          Prior Function            PT Goals (current goals can now be found in the care plan section) Progress towards PT  goals: Progressing toward goals    Frequency    Min 1X/week      PT Plan      Co-evaluation              AM-PAC PT 6 Clicks Mobility   Outcome Measure  Help needed turning from your back to your side while in a flat bed without using bedrails?: None Help needed moving from lying on your back to sitting on the side of a flat bed without using bedrails?: A Little Help needed moving to and from a bed to a chair (including a wheelchair)?: A Little Help needed standing up from a chair using your arms (e.g., wheelchair or bedside chair)?: A Little Help needed to walk in hospital room?: Total Help needed climbing 3-5 steps with a railing? : Total 6 Click Score: 15    End of Session Equipment Utilized During Treatment: Gait belt Activity Tolerance: Patient tolerated treatment well Patient left:  with call bell/phone within reach;in bed;with bed alarm set Nurse Communication: Mobility status PT Visit Diagnosis: History of falling (Z91.81);Repeated falls (R29.6);Other abnormalities of gait and mobility (R26.89);Difficulty in walking, not elsewhere classified (R26.2);Dizziness and giddiness (R42);Pain     Time: 1547-1601 PT Time Calculation (min) (ACUTE ONLY): 14 min  Charges:    $Therapeutic Activity: 8-22 mins PT General Charges $$ ACUTE PT VISIT: 1 Visit                    Darice Potters PT Acute Rehabilitation Services Office [phone] Weekend pager-[phone]   Potters Darice Norris 03/05/2023, 4:23 PM

## 2023-03-06 ENCOUNTER — Inpatient Hospital Stay (HOSPITAL_COMMUNITY): Payer: MEDICAID

## 2023-03-06 DIAGNOSIS — R55 Syncope and collapse: Secondary | ICD-10-CM | POA: Diagnosis not present

## 2023-03-06 DIAGNOSIS — J101 Influenza due to other identified influenza virus with other respiratory manifestations: Secondary | ICD-10-CM | POA: Diagnosis not present

## 2023-03-06 DIAGNOSIS — F332 Major depressive disorder, recurrent severe without psychotic features: Secondary | ICD-10-CM

## 2023-03-06 MED ORDER — MECLIZINE HCL 25 MG PO TABS: 25.0000 mg | ORAL_TABLET | Freq: Three times a day (TID) | ORAL | Status: DC | PRN

## 2023-03-06 MED ORDER — LORAZEPAM 2 MG/ML IJ SOLN
1.0000 mg | Freq: Once | INTRAMUSCULAR | Status: AC
  Administered 2023-03-06: 1 mg via INTRAVENOUS
  Filled 2023-03-06: qty 1

## 2023-03-06 MED ORDER — ARIPIPRAZOLE 5 MG PO TABS
10.0000 mg | ORAL_TABLET | Freq: Every day | ORAL | Status: DC
Start: 2023-03-07 — End: 2023-03-07
  Administered 2023-03-07: 10 mg via ORAL
  Filled 2023-03-06: qty 2

## 2023-03-06 MED ORDER — IOHEXOL 350 MG/ML SOLN
75.0000 mL | Freq: Once | INTRAVENOUS | Status: AC | PRN
  Administered 2023-03-06: 75 mL via INTRAVENOUS

## 2023-03-06 NOTE — Plan of Care (Signed)
  Problem: Health Behavior/Discharge Planning: Goal: Ability to manage health-related needs will improve Outcome: Progressing   Problem: Activity: Goal: Risk for activity intolerance will decrease Outcome: Progressing   Problem: Coping: Goal: Level of anxiety will decrease Outcome: Progressing   Problem: Pain Managment: Goal: General experience of comfort will improve and/or be controlled Outcome: Progressing
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
## 2023-03-06 NOTE — Progress Notes (Signed)
 PROGRESS NOTE    [name] [name]  FMW:[id number] DOB: 07-08-1975 DOA: 02/28/2023 PCP: Gregary Sharper, MD   Brief Narrative: 48 year old with past medical history significant for CAD s/p PCI, CHF, depression, history of seizure , who presents with cough, congestion, wheezing admitted for severe bronchitis, found to have influenza A.  Found to have multiple episode of syncope in the hospital   Assessment & Plan:   Principal Problem:   Influenza A Active Problems:   HTN (hypertension)   MDD (major depressive disorder), recurrent severe, without psychosis (HCC)   CHF (congestive heart failure) (HCC)   Syncope   Acute bronchitis   CAD S/P percutaneous coronary angioplasty   BMI 60.0-69.9, adult (HCC)   Morbid obesity with BMI of 60.0-69.9, adult (HCC)   1-Acute bronchitis with influenza A -Did not receive Tamiflu given onset of symptoms more than 5 days. Continue with IV Solu-Medrol  -Continue with the scheduled DuoNeb -Continue guaifenesin , Brovana  and Pulmicort  Improving.   2-Syncope: ? vasovagal in the setting of poor oral intake dehydration and influenza Echo without significant abnormalities. Reported episode of syncope observed by staff onto his last 3 No events noticed on telemetry Orthostatic Vital negative.  Discussed with Neurology, recommendation for CTA head and neck to look for large vessel occlusion, vertebrobasilar stenosis, subclavian steal syndrome.  CTA negative for large vessel occlusion. Patient reports chronic left-sided weakness, think her symptoms may be a little bit worse since she has been diagnosed with influenza.  Plan to proceed with MRI, will proceed with MRI tomorrow.  She will need IV Ativan  and she has received a dose so far this morning and she is lethargic from Ativan  If her syncopal is positional she might need follow-up with ENT  Transaminases: In the setting of viral illness  CAD status post PCI stable  Depression, bipolar, borderline  schizophrenia: Psych  was consulted patient reporter auditory hallucination Appreciate psych assistance.  Started on Abilify  and prazosin   Mild hypokalemia: Resolved  Seizure: Takes gabapentin   Morbid obesity BMI 65: Need lifestyle modification     Estimated body mass index is 65.32 kg/m as calculated from the following:   Height as of this encounter: 5' 2.25 (1.581 m).   Weight as of this encounter: 163.3 kg.   DVT prophylaxis: Lovenox  Code Status: Full code Family Communication: Care discussed with patient Disposition Plan:  Status is: Inpatient Remains inpatient appropriate because: Management of acute bronchitis and recurrent syncope    Consultants:  Cardiology  Procedures:  Echo  Antimicrobials:    Subjective: Came from CT scan, received Ativan .  She is sleepy.  She is still wheezing but file to be better from her breathing.  She reports that syncope happen when starting movement of her head or changing position  Objective: Vitals:   03/06/23 0428 03/06/23 0500 03/06/23 0804 03/06/23 1419  BP: 120/70   125/74  Pulse: (!) 54   69  Resp: 16 15  (!) 21  Temp: 98.9 F (37.2 C)   98.4 F (36.9 C)  TempSrc: Oral   Oral  SpO2: 100%  96% 93%  Weight:      Height:        Intake/Output Summary (Last 24 hours) at 03/06/2023 1512 Last data filed at 03/06/2023 0700 Gross per 24 hour  Intake 240 ml  Output 1300 ml  Net -1060 ml   Filed Weights   02/28/23 1609  Weight: (!) 163.3 kg    Examination:  General exam: NAD Respiratory system: BL wheezing.  Cardiovascular  system: S 1 S 2 RRR Gastrointestinal system: BS present, soft, nt Central nervous system: alert, able top move all extremities, but weaker on left chronic Extremities: no edema.    Data Reviewed: I have personally reviewed following labs and imaging studies  CBC: Recent Labs  Lab 02/28/23 1630 03/01/23 0652  WBC 5.5 2.6*  NEUTROABS  --  1.5*  HGB 15.0 14.4  HCT 46.0 43.6  MCV 92.2  92.4  PLT 184 169   Basic Metabolic Panel: Recent Labs  Lab 02/28/23 1630 03/01/23 0652 03/03/23 0748 03/05/23 0536  NA 137 137 140 139  K 3.1* 3.6 4.7 4.5  CL 102 106 109 105  CO2 21* 23 25 25   GLUCOSE 90 172* 115* 85  BUN 18 14 19  21*  CREATININE 0.71 0.63 0.68 0.43*  CALCIUM  8.6* 8.6* 8.8* 8.7*  MG  --  2.2  --  2.4  PHOS  --   --   --  3.4   GFR: Estimated Creatinine Clearance: 128.5 mL/min (A) (by C-G formula based on SCr of 0.43 mg/dL (L)). Liver Function Tests: Recent Labs  Lab 03/01/23 0652 03/03/23 0748  AST 227* 35  ALT 370* 162*  ALKPHOS 168* 111  BILITOT 1.0 0.5  PROT 7.5 7.1  ALBUMIN 3.3* 3.0*   No results for input(s): LIPASE, AMYLASE in the last 168 hours. No results for input(s): AMMONIA in the last 168 hours. Coagulation Profile: No results for input(s): INR, PROTIME in the last 168 hours. Cardiac Enzymes: No results for input(s): CKTOTAL, CKMB, CKMBINDEX, TROPONINI in the last 168 hours. BNP (last 3 results) No results for input(s): PROBNP in the last 8760 hours. HbA1C: No results for input(s): HGBA1C in the last 72 hours. CBG: Recent Labs  Lab 02/28/23 1652 03/03/23 1056  GLUCAP 86 113*   Lipid Profile: No results for input(s): CHOL, HDL, LDLCALC, TRIG, CHOLHDL, LDLDIRECT in the last 72 hours. Thyroid Function Tests: No results for input(s): TSH, T4TOTAL, FREET4, T3FREE, THYROIDAB in the last 72 hours. Anemia Panel: No results for input(s): VITAMINB12, FOLATE, FERRITIN, TIBC, IRON, RETICCTPCT in the last 72 hours. Sepsis Labs: No results for input(s): PROCALCITON, LATICACIDVEN in the last 168 hours.  Recent Results (from the past 240 hours)  SARS Coronavirus 2 by RT PCR (hospital order, performed in [facility] hospital lab) *cepheid single result test* Anterior Nasal Swab     Status: None   Collection Time: 02/28/23  4:35 PM   Specimen: Anterior Nasal Swab  Result Value Ref  Range Status   SARS Coronavirus 2 by RT PCR NEGATIVE NEGATIVE Final    Comment: (NOTE) SARS-CoV-2 target nucleic acids are NOT DETECTED.  The SARS-CoV-2 RNA is generally detectable in upper and lower respiratory specimens during the acute phase of infection. The lowest concentration of SARS-CoV-2 viral copies this assay can detect is 250 copies / mL. A negative result does not preclude SARS-CoV-2 infection and should not be used as the sole basis for treatment or other patient management decisions.  A negative result may occur with improper specimen collection / handling, submission of specimen other than nasopharyngeal swab, presence of viral mutation(s) within the areas targeted by this assay, and inadequate number of viral copies (<250 copies / mL). A negative result must be combined with clinical observations, patient history, and epidemiological information.  Fact Sheet for Patients:   roadlaptop.co.za  Fact Sheet for Healthcare Providers: http://kim-miller.com/  This test is not yet approved or  cleared by the [place]  FDA and  has been authorized for detection and/or diagnosis of SARS-CoV-2 by FDA under an Emergency Use Authorization (EUA).  This EUA will remain in effect (meaning this test can be used) for the duration of the COVID-19 declaration under Section 564(b)(1) of the Act, 21 U.S.C. section 360bbb-3(b)(1), unless the authorization is terminated or revoked sooner.  Performed at [facility], [address], [place], [place] [address]   Resp panel by RT-PCR (RSV, Flu A&B, Covid) Anterior Nasal Swab     Status: Abnormal   Collection Time: 02/28/23  7:27 PM   Specimen: Anterior Nasal Swab  Result Value Ref Range Status   SARS Coronavirus 2 by RT PCR NEGATIVE NEGATIVE Final    Comment: (NOTE) SARS-CoV-2 target nucleic acids are NOT DETECTED.  The SARS-CoV-2 RNA is generally detectable in upper  respiratory specimens during the acute phase of infection. The lowest concentration of SARS-CoV-2 viral copies this assay can detect is 138 copies/mL. A negative result does not preclude SARS-Cov-2 infection and should not be used as the sole basis for treatment or other patient management decisions. A negative result may occur with  improper specimen collection/handling, submission of specimen other than nasopharyngeal swab, presence of viral mutation(s) within the areas targeted by this assay, and inadequate number of viral copies(<138 copies/mL). A negative result must be combined with clinical observations, patient history, and epidemiological information. The expected result is Negative.  Fact Sheet for Patients:  bloggercourse.com  Fact Sheet for Healthcare Providers:  seriousbroker.it  This test is no t yet approved or cleared by the [place]  FDA and  has been authorized for detection and/or diagnosis of SARS-CoV-2 by FDA under an Emergency Use Authorization (EUA). This EUA will remain  in effect (meaning this test can be used) for the duration of the COVID-19 declaration under Section 564(b)(1) of the Act, 21 U.S.C.section 360bbb-3(b)(1), unless the authorization is terminated  or revoked sooner.       Influenza A by PCR POSITIVE (A) NEGATIVE Final   Influenza B by PCR NEGATIVE NEGATIVE Final    Comment: (NOTE) The Xpert Xpress SARS-CoV-2/FLU/RSV plus assay is intended as an aid in the diagnosis of influenza from Nasopharyngeal swab specimens and should not be used as a sole basis for treatment. Nasal washings and aspirates are unacceptable for Xpert Xpress SARS-CoV-2/FLU/RSV testing.  Fact Sheet for Patients: bloggercourse.com  Fact Sheet for Healthcare Providers: seriousbroker.it  This test is not yet approved or cleared by the [place]  FDA and has been  authorized for detection and/or diagnosis of SARS-CoV-2 by FDA under an Emergency Use Authorization (EUA). This EUA will remain in effect (meaning this test can be used) for the duration of the COVID-19 declaration under Section 564(b)(1) of the Act, 21 U.S.C. section 360bbb-3(b)(1), unless the authorization is terminated or revoked.     Resp Syncytial Virus by PCR NEGATIVE NEGATIVE Final    Comment: (NOTE) Fact Sheet for Patients: bloggercourse.com  Fact Sheet for Healthcare Providers: seriousbroker.it  This test is not yet approved or cleared by the [place]  FDA and has been authorized for detection and/or diagnosis of SARS-CoV-2 by FDA under an Emergency Use Authorization (EUA). This EUA will remain in effect (meaning this test can be used) for the duration of the COVID-19 declaration under Section 564(b)(1) of the Act, 21 U.S.C. section 360bbb-3(b)(1), unless the authorization is terminated or revoked.  Performed at [facility], [address], [place], [place] [address]  Radiology Studies: CT ANGIO HEAD NECK W WO CM Result Date: 03/06/2023 CLINICAL DATA:  Syncope/presyncope. Cerebrovascular cause suspected. EXAM: CT ANGIOGRAPHY HEAD AND NECK WITH AND WITHOUT CONTRAST TECHNIQUE: Multidetector CT imaging of the head and neck was performed using the standard protocol during bolus administration of intravenous contrast. Multiplanar CT image reconstructions and MIPs were obtained to evaluate the vascular anatomy. Carotid stenosis measurements (when applicable) are obtained utilizing NASCET criteria, using the distal internal carotid diameter as the denominator. RADIATION DOSE REDUCTION: This exam was performed according to the departmental dose-optimization program which includes automated exposure control, adjustment of the mA and/or kV according to patient size and/or use of iterative reconstruction  technique. CONTRAST:  75mL OMNIPAQUE  IOHEXOL  350 MG/ML SOLN COMPARISON:  Head CT January 06, 2023. FINDINGS: CT HEAD FINDINGS Brain: No evidence of acute infarction, hemorrhage, hydrocephalus, extra-axial collection or mass lesion/mass effect. Vascular: No hyperdense vessel or unexpected calcification. Skull: Normal. Negative for fracture or focal lesion. Sinuses/Orbits: Mild mucosal thickening throughout the paranasal sinuses. The orbits are maintained. Other: None. Review of the MIP images confirms the above findings CTA NECK FINDINGS Suboptimal contrast bolus. Aortic arch: Standard branching. Imaged portion shows no evidence of aneurysm or dissection. No significant stenosis of the major arch vessel origins. Right carotid system: No evidence of dissection, stenosis (50% or greater), or occlusion. Left carotid system: No evidence of dissection, stenosis (50% or greater), or occlusion. Vertebral arteries: Suboptimal contrast bolus and artifact from patient's body habitus resulting in and adequate evaluation of the vertebral arteries, particularly at the V1 and proximal V2 segments. Distal V2 and V3 segments appear patent. Skeleton: Negative. Other neck: Negative. Upper chest: Negative. Review of the MIP images confirms the above findings CTA HEAD FINDINGS Suboptimal contrast bolus. Anterior circulation: No significant stenosis, proximal occlusion, aneurysm, or vascular malformation. Posterior circulation: No significant stenosis, proximal occlusion, aneurysm, or vascular malformation. Venous sinuses: As permitted by contrast timing, patent. 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 Hypoplastic intracranial right vertebral artery. Review of the MIP images confirms the above findings IMPRESSION: 1. No acute intracranial abnormality. 2. Suboptimal contrast bolus. No occlusion of high-grade stenosis of the major neck arteries identified. 3. No significant stenosis, proximal occlusion, or  aneurysm in the intracranial circulation. Electronically Signed   By: Katyucia  de Macedo Rodrigues M.D.   On: 03/06/2023 10:58        Scheduled Meds:  arformoterol   15 mcg Nebulization BID   [START ON 03/07/2023] ARIPiprazole   10 mg Oral Daily   aspirin  EC  81 mg Oral Daily   atorvastatin   40 mg Oral Daily   azithromycin   500 mg Oral Daily   benzonatate   200 mg Oral TID   budesonide  (PULMICORT ) nebulizer solution  0.25 mg Nebulization BID   enoxaparin  (LOVENOX ) injection  80 mg Subcutaneous Q24H   gabapentin   600 mg Oral TID   guaiFENesin   1,200 mg Oral BID   ipratropium-albuterol   3 mL Nebulization Q6H   melatonin  3 mg Oral QHS   methylPREDNISolone  (SOLU-MEDROL ) injection  40 mg Intravenous Q24H   polyethylene glycol  17 g Oral BID   prazosin   1 mg Oral QHS   senna  1 tablet Oral QHS   Continuous Infusions:   LOS: 5 days    Time spent: 35 minutes.     Owen DELENA Lore, MD [facility]   If 7PM-7AM, please contact night-coverage [email or web address]  03/06/2023, 3:12 PM

## 2023-03-06 NOTE — Plan of Care (Signed)
  Problem: Education: Goal: Knowledge of General Education information will improve Description: Including pain rating scale, medication(s)/side effects and non-pharmacologic comfort measures Outcome: Progressing   Problem: Clinical Measurements: Goal: Respiratory complications will improve Outcome: Progressing   Problem: Coping: Goal: Level of anxiety will decrease Outcome: Progressing   Problem: Activity: Goal: Risk for activity intolerance will decrease Outcome: Not Progressing

## 2023-03-06 NOTE — Progress Notes (Addendum)
 Rounding Note    Patient Name: [name] [name] Date of Encounter: 03/06/2023  [facility] Cardiologist: Darryle ONEIDA Decent, MD   Subjective   Patient denies having any syncopal episodes yesterday or today. Yesterday during PT, she did have some dizziness and weakness. She was unable to finish PT but found relief after sitting upright in chair. Denies chest pain, SOB, edema. She is accompanied by a female friend and felt comfortable discussing while him present.   Inpatient Medications    Scheduled Meds:  arformoterol   15 mcg Nebulization BID   ARIPiprazole   5 mg Oral Daily   aspirin  EC  81 mg Oral Daily   atorvastatin   40 mg Oral Daily   azithromycin   500 mg Oral Daily   benzonatate   200 mg Oral TID   budesonide  (PULMICORT ) nebulizer solution  0.25 mg Nebulization BID   enoxaparin  (LOVENOX ) injection  80 mg Subcutaneous Q24H   gabapentin   600 mg Oral TID   guaiFENesin   1,200 mg Oral BID   ipratropium-albuterol   3 mL Nebulization Q6H   LORazepam   1 mg Intravenous Once   melatonin  3 mg Oral QHS   methylPREDNISolone  (SOLU-MEDROL ) injection  40 mg Intravenous Q24H   polyethylene glycol  17 g Oral BID   prazosin   1 mg Oral QHS   senna  1 tablet Oral QHS   Continuous Infusions:  PRN Meds: acetaminophen  **OR** acetaminophen , albuterol , guaiFENesin , oxyCODONE -acetaminophen    Vital Signs    Vitals:   03/06/23 0400 03/06/23 0428 03/06/23 0500 03/06/23 0804  BP:  120/70    Pulse:  (!) 54    Resp: 14 16 15    Temp:  98.9 F (37.2 C)    TempSrc:  Oral    SpO2:  100%  96%  Weight:      Height:        Intake/Output Summary (Last 24 hours) at 03/06/2023 0903 Last data filed at 03/06/2023 0700 Gross per 24 hour  Intake 240 ml  Output 2050 ml  Net -1810 ml      02/28/2023    4:09 PM 02/23/2023    2:13 PM 01/17/2023   10:47 AM  Last 3 Weights  Weight (lbs) 360 lb 0.2 oz 360 lb 0.2 oz 360 lb  Weight (kg) 163.3 kg 163.3 kg 163.295 kg      Telemetry    Normal  Sinus rhythm with HR 50-70's- Personally Reviewed  Physical Exam   GEN: Sitting upright in bed with female friend present. No acute distress.   Neck: No JVD Cardiac: RRR, bilateral pulse 2+  Respiratory: Wheezing bilaterally. Normal WOB on room air. Has a cough that produces yellow sputum  GI: Soft, nontender, non-distended  MS: No edema in BLE; No deformity. Neuro:  Nonfocal  Psych: Normal affect   Labs    High Sensitivity Troponin:   Recent Labs  Lab 02/28/23 1720  TROPONINIHS 6     Chemistry Recent Labs  Lab 03/01/23 0652 03/03/23 0748 03/05/23 0536  NA 137 140 139  K 3.6 4.7 4.5  CL 106 109 105  CO2 23 25 25   GLUCOSE 172* 115* 85  BUN 14 19 21*  CREATININE 0.63 0.68 0.43*  CALCIUM  8.6* 8.8* 8.7*  MG 2.2  --  2.4  PROT 7.5 7.1  --   ALBUMIN 3.3* 3.0*  --   AST 227* 35  --   ALT 370* 162*  --   ALKPHOS 168* 111  --   BILITOT 1.0 0.5  --  GFRNONAA >60 >60 >60  ANIONGAP 8 6 9     Lipids No results for input(s): CHOL, TRIG, HDL, LABVLDL, LDLCALC, CHOLHDL in the last 168 hours.  Hematology Recent Labs  Lab 02/28/23 1630 03/01/23 0652  WBC 5.5 2.6*  RBC 4.99 4.72  HGB 15.0 14.4  HCT 46.0 43.6  MCV 92.2 92.4  MCH 30.1 30.5  MCHC 32.6 33.0  RDW 13.6 13.6  PLT 184 169   Thyroid  Recent Labs  Lab 03/01/23 0652  TSH 0.760    BNP Recent Labs  Lab 02/28/23 1720  BNP 30.9    DDimer  Recent Labs  Lab 02/28/23 1720  DDIMER 1.46*     Radiology    No results found.  Cardiac Studies   Echocardiogram 03/01/2023:  1. Left ventricular ejection fraction, by estimation, is 60 to 65%. The  left ventricle has normal function. The left ventricle has no regional  wall motion abnormalities. Left ventricular diastolic parameters are  indeterminate.   2. Right ventricular systolic function is normal. The right ventricular  size is mildly enlarged. Tricuspid regurgitation signal is inadequate for  assessing PA pressure.   3. The mitral valve is  normal in structure. No evidence of mitral valve  regurgitation. No evidence of mitral stenosis.   4. The aortic valve was not well visualized. Aortic valve regurgitation  is trivial. No aortic stenosis is present.   5. Aortic dilatation noted. There is mild dilatation of the ascending  aorta, measuring 41 mm.   Comparison(s): No prior Echocardiogram.   Patient Profile     48 y.o. female with a history of CAD, depression bipolar disease disorder, asthma, CHF who was consulted on 03/05/2023 for evaluation of syncope.  Assessment & Plan    Syncope  - Patient presented to the ED after she had a syncopal episode.  She had been having diarrhea and poor oral intake for the past 5 days prior to the episode.  She had a prodrome of dizziness and diaphoresis.  She denied any chest pain at that time. - Strongly suspect that her syncope episodes was orthostatic versus vasovagal with influenza A and poor oral intake.  - Initial EKG was without any ischemic changes. - Echocardiogram showed EF of 60 to 65%, no regional WMA, normal right ventricular function, no significant valvular abnormalities. - She had a syncopal episode while working with PT on 2/3. No concerning findings on telemetry at that time. No need for loop recorder or outpatient monitor as she was attached to telemetry during an episode  - Patient reported having a history of vertigo that has been followed by [facility].  It is likely that vertigo is contributing to the symptoms.  - Today she denies recurrently syncope. She was able to work with PT yesterday without syncope  - Per PT evaluation, Orthostatic BP are negative. Note, this is after she has received IV fluids and has been eating for the past few days  History of CAD -Patient reported having a stent placed at 48 years old -Patient initially reported occasional episodes of chest discomfort that occur randomly.  Not associated with exertion nor relieved by rest.  Pain was described  as sharp.  She believed the pain may be related to her GERD - Today, she denies any chest pain or SOB since admission. Echo showed normal EF and no WMAs  - Continue aspirin  81 mg daily, lipitor 40 mg daily   OSA  - Patient was previously diagnosed with OSA and was  advised to wear CPAP.  Patient reported that she was unable to tolerate her CPAP, also machine got stolen.  - Recommend outpatient sleep study   Otherwise Per Primary  -  Acute Bronchitis with influenza A - Elevated transaminases  - Depression - Bipolar  - Borderline Schizophrenia  - History of seizures  - Morbid obesity    For questions or updates, please contact [facility] Please consult [email or web address] for contact info under        Signed, Rollo FABIENE Louder, PA-C  03/06/2023, 9:03 AM

## 2023-03-06 NOTE — Progress Notes (Signed)
 Physical Therapy Treatment Patient Details Name: [name] [name] MRN: [id number] DOB: [birth year] Today's Date: 03/06/2023   History of Present Illness 48 yo female presents to therapy following hospital admission on 02/28/2023 due to cough, congestion, wheezing and N and V with poor PO intake as well as syncopal episode. Pt was found to have the flu and bronchitis. Pt has had several hospitalizations in the past 6 months. Pt PMH includes but is not limited to: CAD s/p PCI, CHF, depression, seizures, bipolar, vertigo, asthma, falls, PTSD, LBP, and cancer.    PT Comments  The patient tolerated ambulation in room x 20' then 10'. Patient's BP stable. One episode of dizziness when sitting up. Vestibular evaluation performed(see below documentation).  Sup BP: 145/91, 67 Sitting: 143/91, 76 Bed>chair: 135/80, 74 Post session: 144/82, 112  Patient will benefit from a rollator at Dc and OPPT if available. Continue to progress ambulation as tolerated.   If plan is discharge home, recommend the following: A little help with walking and/or transfers;Assistance with cooking/housework;Assist for transportation   Can travel by private vehicle        Equipment Recommendations  Rollator (4 wheels)    Recommendations for Other Services       Precautions / Restrictions Precautions Precautions: Fall Precaution Comments: syncopal episodes, no forwarning Restrictions Weight Bearing Restrictions Per Provider Order: No     Mobility  Bed Mobility Overal bed mobility: Needs Assistance Bed Mobility: Supine to Sit           General bed mobility comments: support LLE across the bed to the edge    Transfers Overall transfer level: Needs assistance Equipment used: Rolling walker (2 wheels) Transfers: Bed to chair/wheelchair/BSC Sit to Stand: Contact guard assist                Ambulation/Gait Ambulation/Gait assistance: Contact guard assist, +2 safety/equipment Gait Distance (Feet):  20 Feet (then 10') Assistive device: Rolling walker (2 wheels) Gait Pattern/deviations: Step-to pattern, Wide base of support Gait velocity: decreased     General Gait Details: min cues, no overt instabilty noted   Stairs             Wheelchair Mobility     Tilt Bed    Modified Rankin (Stroke Patients Only)       Balance Overall balance assessment: Needs assistance, History of Falls Sitting-balance support: Feet supported Sitting balance-Leahy Scale: Good     Standing balance support: Bilateral upper extremity supported, During functional activity Standing balance-Leahy Scale: Fair                              Cognition Arousal: Alert Behavior During Therapy: WFL for tasks assessed/performed, Anxious Overall Cognitive Status: Within Functional Limits for tasks assessed                                          Exercises      General Comments General comments (skin integrity, edema, etc.): 03/06/23 Pt seen for possible vertigo component to syncopal episodes.  Pt reports hx of vertigo but not a good historian.   Performed positional testing for horizontal canal and Dix Hallpike and no nystagmus observed.  Pt does have difficulty maintaining focus and eyes constantly roaming despite cues.  Pt became clammy and perspirating with laying flat in supine after positional testing.  Pt feels anxiety  contributing.  Pt reports head turn to left have been worse this admission.  Pt also reports hx of looking upwards in sitting will cause vertigo and syncope as well as bending forward.  Pt with pending MRI tomorrow. Pt states she has received Meclizine  in the past for vertigo and she feels she may benefit for Meclizine  with this admission so RN notified.  -- Tari Farm, PT, DPT (assessed during session with Darice Potters, PT)      Pertinent Vitals/Pain Pain Assessment Faces Pain Scale: Hurts little more Pain Location: back Pain Intervention(s):  Monitored during session, Premedicated before session    Home Living                          Prior Function            PT Goals (current goals can now be found in the care plan section) Progress towards PT goals: Progressing toward goals    Frequency    Min 1X/week      PT Plan      Co-evaluation              AM-PAC PT 6 Clicks Mobility   Outcome Measure  Help needed turning from your back to your side while in a flat bed without using bedrails?: None Help needed moving from lying on your back to sitting on the side of a flat bed without using bedrails?: A Little Help needed moving to and from a bed to a chair (including a wheelchair)?: A Little Help needed standing up from a chair using your arms (e.g., wheelchair or bedside chair)?: A Little Help needed to walk in hospital room?: A Little Help needed climbing 3-5 steps with a railing? : Total 6 Click Score: 17    End of Session Equipment Utilized During Treatment: Gait belt Activity Tolerance: Patient tolerated treatment well Patient left: in chair;with call bell/phone within reach Nurse Communication: Mobility status PT Visit Diagnosis: History of falling (Z91.81);Repeated falls (R29.6);Other abnormalities of gait and mobility (R26.89);Difficulty in walking, not elsewhere classified (R26.2);Dizziness and giddiness (R42);Pain     Time: 8540-8451 PT Time Calculation (min) (ACUTE ONLY): 49 min  Charges:    $Gait Training: 23-37 mins $Therapeutic Activity: 8-22 mins PT General Charges $$ ACUTE PT VISIT: 1 Visit                     Darice Potters PT Acute Rehabilitation Services Office [phone] Weekend pager-[phone]    Potters Darice Norris 03/06/2023, 4:34 PM

## 2023-03-07 ENCOUNTER — Inpatient Hospital Stay (HOSPITAL_COMMUNITY): Payer: MEDICAID

## 2023-03-07 DIAGNOSIS — J101 Influenza due to other identified influenza virus with other respiratory manifestations: Secondary | ICD-10-CM | POA: Diagnosis not present

## 2023-03-07 MED ORDER — LORAZEPAM 2 MG/ML IJ SOLN
1.0000 mg | Freq: Once | INTRAMUSCULAR | Status: AC
  Administered 2023-03-07: 1 mg via INTRAVENOUS
  Filled 2023-03-07: qty 1

## 2023-03-07 MED ORDER — ARIPIPRAZOLE 5 MG PO TABS
15.0000 mg | ORAL_TABLET | Freq: Every day | ORAL | Status: DC
  Administered 2023-03-08 – 2023-03-10 (×3): 15 mg via ORAL
  Filled 2023-03-07 (×3): qty 3

## 2023-03-07 MED ORDER — IPRATROPIUM-ALBUTEROL 0.5-2.5 (3) MG/3ML IN SOLN
3.0000 mL | Freq: Three times a day (TID) | RESPIRATORY_TRACT | Status: DC
  Administered 2023-03-08 – 2023-03-10 (×7): 3 mL via RESPIRATORY_TRACT
  Filled 2023-03-07 (×6): qty 3

## 2023-03-07 NOTE — Evaluation (Signed)
 Occupational Therapy Evaluation Patient Details Name: [name] [name] MRN: [id number] DOB: 1976/01/17 Today's Date: 03/07/2023   History of Present Illness 48 yo female presents to therapy following hospital admission on 02/28/2023 due to cough, congestion, wheezing and N and V with poor PO intake as well as syncopal episode. Pt was found to have the flu and bronchitis. Pt has had several hospitalizations in the past 6 months. Pt PMH includes but is not limited to: CAD s/p PCI, CHF, depression, seizures, bipolar, vertigo, asthma, falls, PTSD, LBP, and cancer.   Clinical Impression   Pt presents with decline in function and safety with ADLs and ADL mobility with impaired balance and endurance. PTA pt was Ind with ADLs/selfcare, mobility but reports that she has difficulty with LB and periarea hygiene. Pt currently requires set up with UB ADLs, mod A with LB ADLs, max A with toileting hygiene, CGA +2 for sit - stand and mobility using RW and no indication of LOB or dizziness. Pt indicated mild light headedness during functional mobility with RW, no episodes of LOC today. Pt is scheduled for MRI today. Pt will benefit LB ADL A/E, toileting aid for home use. Pt would benefit from acute OT services to maximize level of function and safety      If plan is discharge home, recommend the following: A lot of help with bathing/dressing/bathroom;A little help with walking and/or transfers;Assistance with cooking/housework;Assist for transportation;Help with stairs or ramp for entrance    Functional Status Assessment  Patient has had a recent decline in their functional status and demonstrates the ability to make significant improvements in function in a reasonable and predictable amount of time.  Equipment Recommendations  Other (comment) (LH bath sponge, reacher, toileting aid)    Recommendations for Other Services       Precautions / Restrictions Precautions Precautions: Fall;Other  (comment) Precaution Comments: syncopal episodes, no forwarning Restrictions Weight Bearing Restrictions Per Provider Order: No      Mobility Bed Mobility Overal bed mobility: Needs Assistance Bed Mobility: Supine to Sit     Supine to sit: HOB elevated, Used rails, Supervision     General bed mobility comments: min cues and increased time. encouragement for LLE to EOB    Transfers Overall transfer level: Needs assistance Equipment used: Rolling walker (2 wheels) Transfers: Sit to/from Stand, Bed to chair/wheelchair/BSC Sit to Stand: Contact guard assist, +2 safety/equipment     Step pivot transfers: Contact guard assist, +2 physical assistance, +2 safety/equipment     General transfer comment: pt able to perform sit to stand from EOB, gait tasks then seated in recliner, SPT from recliner <> BSC, cues for safety, limiting trunk and cervical flexion, and maintaining eyes open      Balance Overall balance assessment: Needs assistance, History of Falls Sitting-balance support: Feet supported Sitting balance-Leahy Scale: Good     Standing balance support: Bilateral upper extremity supported, During functional activity Standing balance-Leahy Scale: Fair                             ADL either performed or assessed with clinical judgement   ADL Overall ADL's : Needs assistance/impaired Eating/Feeding: Independent   Grooming: Wash/dry hands;Wash/dry face;Set up;Sitting   Upper Body Bathing: Set up;Sitting   Lower Body Bathing: Moderate assistance   Upper Body Dressing : Set up   Lower Body Dressing: Moderate assistance   Toilet Transfer: Contact guard assist;+2 for physical assistance;+2 for safety/equipment;Ambulation;Rolling walker (2  wheels);BSC/3in1;Cueing for safety   Toileting- Clothing Manipulation and Hygiene: Maximal assistance Toileting - Clothing Manipulation Details (indicate cue type and reason): max A for posterior hygiene     Functional  mobility during ADLs: Contact guard assist;+2 for physical assistance;+2 for safety/equipment;Rolling walker (2 wheels);Cueing for safety General ADL Comments: Initiated ADL A/E education for LB selfcare and hygiene     Vision Ability to See in Adequate Light: 0 Adequate Patient Visual Report: No change from baseline       Perception         Praxis         Pertinent Vitals/Pain Pain Assessment Pain Assessment: No/denies pain Pain Intervention(s): Monitored during session, Repositioned     Extremity/Trunk Assessment Upper Extremity Assessment Upper Extremity Assessment: Overall WFL for tasks assessed   Lower Extremity Assessment Lower Extremity Assessment: Defer to PT evaluation   Cervical / Trunk Assessment Cervical / Trunk Assessment: Normal;Other exceptions Cervical / Trunk Exceptions: body habitus   Communication Communication Communication: No apparent difficulties   Cognition Arousal: Alert Behavior During Therapy: WFL for tasks assessed/performed, Anxious Overall Cognitive Status: Within Functional Limits for tasks assessed                                       General Comments       Exercises     Shoulder Instructions      Home Living Family/patient expects to be discharged to:: Shelter/Homeless                                 Additional Comments: IRC/shelter (?) in [place]      Prior Functioning/Environment Prior Level of Function : Independent/Modified Independent             Mobility Comments: pt reports using walking stick for ambulation, frequent falls, long distance between tiny home and bathroom in which pt is required to navigate on a variety of uneven surfaces ~ 50 feet, ramp to enter tiny home ADLs Comments: reports IND with all ADLs and self care tasks, but difficulty with LB and peri hygiene        OT Problem List: Impaired balance (sitting and/or standing);Decreased activity tolerance;Decreased  coordination;Decreased knowledge of use of DME or AE      OT Treatment/Interventions: Self-care/ADL training;DME and/or AE instruction;Therapeutic activities;Patient/family education    OT Goals(Current goals can be found in the care plan section) Acute Rehab OT Goals Patient Stated Goal: get better OT Goal Formulation: With patient Time For Goal Achievement: 03/21/23 Potential to Achieve Goals: Good ADL Goals Pt Will Perform Grooming: with contact guard assist;with supervision;with set-up;standing Pt Will Perform Lower Body Bathing: with min assist;with adaptive equipment Pt Will Perform Lower Body Dressing: with min assist;with adaptive equipment Pt Will Transfer to Toilet: with contact guard assist;with supervision;ambulating Pt Will Perform Toileting - Clothing Manipulation and hygiene: with mod assist;with min assist;with adaptive equipment;sitting/lateral leans;sit to/from stand Pt Will Perform Tub/Shower Transfer: with contact guard assist;with supervision;ambulating;rolling walker;shower seat;grab bars  OT Frequency: Min 2X/week    Co-evaluation PT/OT/SLP Co-Evaluation/Treatment: Yes Reason for Co-Treatment: Complexity of the patient's impairments (multi-system involvement);Necessary to address cognition/behavior during functional activity;For patient/therapist safety;To address functional/ADL transfers PT goals addressed during session: Mobility/safety with mobility;Balance;Proper use of DME OT goals addressed during session: ADL's and self-care;Proper use of Adaptive equipment and DME  AM-PAC OT 6 Clicks Daily Activity     Outcome Measure Help from another person eating meals?: None Help from another person taking care of personal grooming?: A Little Help from another person toileting, which includes using toliet, bedpan, or urinal?: A Lot Help from another person bathing (including washing, rinsing, drying)?: A Lot Help from another person to put on and taking off  regular upper body clothing?: A Little Help from another person to put on and taking off regular lower body clothing?: A Lot 6 Click Score: 16   End of Session Equipment Utilized During Treatment: Gait belt;Rolling walker (2 wheels);Other (comment) (BSC)  Activity Tolerance: Patient tolerated treatment well Patient left: in chair;with call bell/phone within reach  OT Visit Diagnosis: Unsteadiness on feet (R26.81);Other abnormalities of gait and mobility (R26.89);History of falling (Z91.81);Dizziness and giddiness (R42)                Time: 9040-8955 OT Time Calculation (min): 45 min Charges:  OT General Charges $OT Visit: 1 Visit OT Evaluation $OT Eval Moderate Complexity: 1 Mod OT Treatments $Self Care/Home Management : 8-22 mins    Jacques Karna Loose 03/07/2023, 1:42 PM

## 2023-03-07 NOTE — TOC Progression Note (Signed)
 Transition of Care [facility]) - Progression Note    Patient Details  Name: [name] [name] MRN: [id number] Date of Birth: 11/15/1975  [facility]) CM/SW Contact  Sheri ONEIDA [place], [place] Phone Number: 03/07/2023, 3:39 PM  Clinical Narrative:    Pt recommended for outpatient PT. Orders for outpatient PT submitted and added to AVS. Rollator and nebulizer ordered w/ [employer].     Barriers to Discharge: Homeless with medical needs, Transportation  Expected Discharge Plan and Services In-house Referral: Clinical Social Work     Living arrangements for the past 2 months: No permanent address, Homeless                                       Social Determinants of Health (SDOH) Interventions SDOH Screenings   Food Insecurity: No Food Insecurity (03/01/2023)  Housing: High Risk (03/04/2023)  Transportation Needs: No Transportation Needs (03/04/2023)  Recent Concern: Transportation Needs - Unmet Transportation Needs (03/01/2023)  Utilities: Not At Risk (03/01/2023)  Alcohol Screen: Low Risk  (09/24/2022)  Depression (PHQ2-9): High Risk (12/31/2022)  Social Connections: Unknown (06/11/2021)   Received from [facility], [facility]  Tobacco Use: Low Risk  (03/01/2023)    Readmission Risk Interventions    03/03/2023    2:55 PM  Readmission Risk Prevention Plan  Transportation Screening Complete  Medication Review (RN Care Manager) Complete  PCP or Specialist appointment within 3-5 days of discharge Complete  HRI or Home Care Consult Complete  SW Recovery Care/Counseling Consult Complete  Palliative Care Screening Complete  Skilled Nursing Facility Complete

## 2023-03-07 NOTE — Progress Notes (Signed)
 PROGRESS NOTE    RAELEE [name]  FMW:[id number] DOB: May 29, 1975 DOA: 02/28/2023 PCP: Gregary Sharper, MD   Brief Narrative: 48 year old with past medical history significant for CAD s/p PCI, CHF, depression, history of seizure , who presents with cough, congestion, wheezing admitted for severe bronchitis, found to have influenza A.  Found to have multiple episode of syncope in the hospital   Assessment & Plan:   Principal Problem:   Influenza A Active Problems:   HTN (hypertension)   MDD (major depressive disorder), recurrent severe, without psychosis (HCC)   CHF (congestive heart failure) (HCC)   Syncope   Acute bronchitis   CAD S/P percutaneous coronary angioplasty   BMI 60.0-69.9, adult (HCC)   Morbid obesity with BMI of 60.0-69.9, adult (HCC)   1-Acute bronchitis with influenza A -Did not receive Tamiflu given onset of symptoms more than 5 days. Continue with IV Solu-Medrol  -Continue with the scheduled DuoNeb -Continue guaifenesin , Brovana  and Pulmicort  Improving. Still wheezing.   2-Syncope: -? vasovagal in the setting of poor oral intake dehydration and influenza, Vertigo related syncope?.  -Echo without significant abnormalities. -Reported episode of syncope observed by staff onto his last 2/3 -No events noticed on telemetry, Cardiology sign off.  -Orthostatic Vital negative.  -Discussed with Neurology, recommendation for CTA head and neck to look for large vessel occlusion, vertebrobasilar stenosis, subclavian steal syndrome.  CTA negative for large vessel occlusion. -Patient reports chronic left-sided weakness, think her symptoms may be a little bit worse since she has been diagnosed with influenza.  Plan to proceed with MRI, will proceed with MRI today.  -If her syncopal is positional she might need follow-up with ENT  Transaminases: In the setting of viral illness  CAD status post PCI stable  Depression, bipolar, borderline schizophrenia: Psych  was  consulted patient reporter auditory hallucination Appreciate psych assistance.  Started on Abilify  and prazosin   Mild hypokalemia: Resolved  Seizure: Takes gabapentin   Morbid obesity BMI 65: Need lifestyle modification     Estimated body mass index is 65.32 kg/m as calculated from the following:   Height as of this encounter: 5' 2.25 (1.581 m).   Weight as of this encounter: 163.3 kg.   DVT prophylaxis: Lovenox  Code Status: Full code Family Communication: Care discussed with patient Disposition Plan:  Status is: Inpatient Remains inpatient appropriate because: Management of acute bronchitis and recurrent syncope    Consultants:  Cardiology  Procedures:  Echo  Antimicrobials:    Subjective: Nebulizer helping with breathing, still wheezing.  She was able to transfer to chair with PT>   Objective: Vitals:   03/06/23 2006 03/06/23 2008 03/07/23 0156 03/07/23 0941  BP:      Pulse:      Resp:      Temp:      TempSrc:      SpO2: 97% 97% 96% 95%  Weight:      Height:        Intake/Output Summary (Last 24 hours) at 03/07/2023 1255 Last data filed at 03/07/2023 0741 Gross per 24 hour  Intake --  Output 600 ml  Net -600 ml   Filed Weights   02/28/23 1609  Weight: (!) 163.3 kg    Examination:  General exam: NAD Respiratory system: BL wheezing.  Cardiovascular system: S 1, S 2 RRR Gastrointestinal system: BS present, soft, nt Central nervous system: alert, able top move all extremities, but weaker on left chronic Extremities: No edema    Data Reviewed: I have personally reviewed following labs and  imaging studies  CBC: Recent Labs  Lab 02/28/23 1630 03/01/23 0652  WBC 5.5 2.6*  NEUTROABS  --  1.5*  HGB 15.0 14.4  HCT 46.0 43.6  MCV 92.2 92.4  PLT 184 169   Basic Metabolic Panel: Recent Labs  Lab 02/28/23 1630 03/01/23 0652 03/03/23 0748 03/05/23 0536  NA 137 137 140 139  K 3.1* 3.6 4.7 4.5  CL 102 106 109 105  CO2 21* 23 25 25    GLUCOSE 90 172* 115* 85  BUN 18 14 19  21*  CREATININE 0.71 0.63 0.68 0.43*  CALCIUM  8.6* 8.6* 8.8* 8.7*  MG  --  2.2  --  2.4  PHOS  --   --   --  3.4   GFR: Estimated Creatinine Clearance: 128.5 mL/min (A) (by C-G formula based on SCr of 0.43 mg/dL (L)). Liver Function Tests: Recent Labs  Lab 03/01/23 0652 03/03/23 0748  AST 227* 35  ALT 370* 162*  ALKPHOS 168* 111  BILITOT 1.0 0.5  PROT 7.5 7.1  ALBUMIN 3.3* 3.0*   No results for input(s): LIPASE, AMYLASE in the last 168 hours. No results for input(s): AMMONIA in the last 168 hours. Coagulation Profile: No results for input(s): INR, PROTIME in the last 168 hours. Cardiac Enzymes: No results for input(s): CKTOTAL, CKMB, CKMBINDEX, TROPONINI in the last 168 hours. BNP (last 3 results) No results for input(s): PROBNP in the last 8760 hours. HbA1C: No results for input(s): HGBA1C in the last 72 hours. CBG: Recent Labs  Lab 02/28/23 1652 03/03/23 1056  GLUCAP 86 113*   Lipid Profile: No results for input(s): CHOL, HDL, LDLCALC, TRIG, CHOLHDL, LDLDIRECT in the last 72 hours. Thyroid Function Tests: No results for input(s): TSH, T4TOTAL, FREET4, T3FREE, THYROIDAB in the last 72 hours. Anemia Panel: No results for input(s): VITAMINB12, FOLATE, FERRITIN, TIBC, IRON, RETICCTPCT in the last 72 hours. Sepsis Labs: No results for input(s): PROCALCITON, LATICACIDVEN in the last 168 hours.  Recent Results (from the past 240 hours)  SARS Coronavirus 2 by RT PCR (hospital order, performed in [facility] hospital lab) *cepheid single result test* Anterior Nasal Swab     Status: None   Collection Time: 02/28/23  4:35 PM   Specimen: Anterior Nasal Swab  Result Value Ref Range Status   SARS Coronavirus 2 by RT PCR NEGATIVE NEGATIVE Final    Comment: (NOTE) SARS-CoV-2 target nucleic acids are NOT DETECTED.  The SARS-CoV-2 RNA is generally detectable in upper and  lower respiratory specimens during the acute phase of infection. The lowest concentration of SARS-CoV-2 viral copies this assay can detect is 250 copies / mL. A negative result does not preclude SARS-CoV-2 infection and should not be used as the sole basis for treatment or other patient management decisions.  A negative result may occur with improper specimen collection / handling, submission of specimen other than nasopharyngeal swab, presence of viral mutation(s) within the areas targeted by this assay, and inadequate number of viral copies (<250 copies / mL). A negative result must be combined with clinical observations, patient history, and epidemiological information.  Fact Sheet for Patients:   roadlaptop.co.za  Fact Sheet for Healthcare Providers: http://kim-miller.com/  This test is not yet approved or  cleared by the [place]  FDA and has been authorized for detection and/or diagnosis of SARS-CoV-2 by FDA under an Emergency Use Authorization (EUA).  This EUA will remain in effect (meaning this test can be used) for the duration of the COVID-19 declaration under Section 564(b)(1) of  the Act, 21 U.S.C. section 360bbb-3(b)(1), unless the authorization is terminated or revoked sooner.  Performed at [facility], [address] [place], [place] [address]   Resp panel by RT-PCR (RSV, Flu A&B, Covid) Anterior Nasal Swab     Status: Abnormal   Collection Time: 02/28/23  7:27 PM   Specimen: Anterior Nasal Swab  Result Value Ref Range Status   SARS Coronavirus 2 by RT PCR NEGATIVE NEGATIVE Final    Comment: (NOTE) SARS-CoV-2 target nucleic acids are NOT DETECTED.  The SARS-CoV-2 RNA is generally detectable in upper respiratory specimens during the acute phase of infection. The lowest concentration of SARS-CoV-2 viral copies this assay can detect is 138 copies/mL. A negative result does not preclude  SARS-Cov-2 infection and should not be used as the sole basis for treatment or other patient management decisions. A negative result may occur with  improper specimen collection/handling, submission of specimen other than nasopharyngeal swab, presence of viral mutation(s) within the areas targeted by this assay, and inadequate number of viral copies(<138 copies/mL). A negative result must be combined with clinical observations, patient history, and epidemiological information. The expected result is Negative.  Fact Sheet for Patients:  bloggercourse.com  Fact Sheet for Healthcare Providers:  seriousbroker.it  This test is no t yet approved or cleared by the [place]  FDA and  has been authorized for detection and/or diagnosis of SARS-CoV-2 by FDA under an Emergency Use Authorization (EUA). This EUA will remain  in effect (meaning this test can be used) for the duration of the COVID-19 declaration under Section 564(b)(1) of the Act, 21 U.S.C.section 360bbb-3(b)(1), unless the authorization is terminated  or revoked sooner.       Influenza A by PCR POSITIVE (A) NEGATIVE Final   Influenza B by PCR NEGATIVE NEGATIVE Final    Comment: (NOTE) The Xpert Xpress SARS-CoV-2/FLU/RSV plus assay is intended as an aid in the diagnosis of influenza from Nasopharyngeal swab specimens and should not be used as a sole basis for treatment. Nasal washings and aspirates are unacceptable for Xpert Xpress SARS-CoV-2/FLU/RSV testing.  Fact Sheet for Patients: bloggercourse.com  Fact Sheet for Healthcare Providers: seriousbroker.it  This test is not yet approved or cleared by the [place]  FDA and has been authorized for detection and/or diagnosis of SARS-CoV-2 by FDA under an Emergency Use Authorization (EUA). This EUA will remain in effect (meaning this test can be used) for the duration of  the COVID-19 declaration under Section 564(b)(1) of the Act, 21 U.S.C. section 360bbb-3(b)(1), unless the authorization is terminated or revoked.     Resp Syncytial Virus by PCR NEGATIVE NEGATIVE Final    Comment: (NOTE) Fact Sheet for Patients: bloggercourse.com  Fact Sheet for Healthcare Providers: seriousbroker.it  This test is not yet approved or cleared by the [place]  FDA and has been authorized for detection and/or diagnosis of SARS-CoV-2 by FDA under an Emergency Use Authorization (EUA). This EUA will remain in effect (meaning this test can be used) for the duration of the COVID-19 declaration under Section 564(b)(1) of the Act, 21 U.S.C. section 360bbb-3(b)(1), unless the authorization is terminated or revoked.  Performed at [facility], [address] [place], [place] [address]          Radiology Studies: CT ANGIO HEAD NECK W WO CM Result Date: 03/06/2023 CLINICAL DATA:  Syncope/presyncope. Cerebrovascular cause suspected. EXAM: CT ANGIOGRAPHY HEAD AND NECK WITH AND WITHOUT CONTRAST TECHNIQUE: Multidetector CT imaging of the head and neck was performed using  the standard protocol during bolus administration of intravenous contrast. Multiplanar CT image reconstructions and MIPs were obtained to evaluate the vascular anatomy. Carotid stenosis measurements (when applicable) are obtained utilizing NASCET criteria, using the distal internal carotid diameter as the denominator. RADIATION DOSE REDUCTION: This exam was performed according to the departmental dose-optimization program which includes automated exposure control, adjustment of the mA and/or kV according to patient size and/or use of iterative reconstruction technique. CONTRAST:  75mL OMNIPAQUE  IOHEXOL  350 MG/ML SOLN COMPARISON:  Head CT January 06, 2023. FINDINGS: CT HEAD FINDINGS Brain: No evidence of acute infarction, hemorrhage, hydrocephalus,  extra-axial collection or mass lesion/mass effect. Vascular: No hyperdense vessel or unexpected calcification. Skull: Normal. Negative for fracture or focal lesion. Sinuses/Orbits: Mild mucosal thickening throughout the paranasal sinuses. The orbits are maintained. Other: None. Review of the MIP images confirms the above findings CTA NECK FINDINGS Suboptimal contrast bolus. Aortic arch: Standard branching. Imaged portion shows no evidence of aneurysm or dissection. No significant stenosis of the major arch vessel origins. Right carotid system: No evidence of dissection, stenosis (50% or greater), or occlusion. Left carotid system: No evidence of dissection, stenosis (50% or greater), or occlusion. Vertebral arteries: Suboptimal contrast bolus and artifact from patient's body habitus resulting in and adequate evaluation of the vertebral arteries, particularly at the V1 and proximal V2 segments. Distal V2 and V3 segments appear patent. Skeleton: Negative. Other neck: Negative. Upper chest: Negative. Review of the MIP images confirms the above findings CTA HEAD FINDINGS Suboptimal contrast bolus. Anterior circulation: No significant stenosis, proximal occlusion, aneurysm, or vascular malformation. Posterior circulation: No significant stenosis, proximal occlusion, aneurysm, or vascular malformation. Venous sinuses: As permitted by contrast timing, patent. 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 Hypoplastic intracranial right vertebral artery. Review of the MIP images confirms the above findings IMPRESSION: 1. No acute intracranial abnormality. 2. Suboptimal contrast bolus. No occlusion of high-grade stenosis of the major neck arteries identified. 3. No significant stenosis, proximal occlusion, or aneurysm in the intracranial circulation. Electronically Signed   By: Katyucia  de Macedo Rodrigues M.D.   On: 03/06/2023 10:58        Scheduled Meds:  arformoterol   15 mcg Nebulization BID    ARIPiprazole   10 mg Oral Daily   aspirin  EC  81 mg Oral Daily   atorvastatin   40 mg Oral Daily   azithromycin   500 mg Oral Daily   benzonatate   200 mg Oral TID   budesonide  (PULMICORT ) nebulizer solution  0.25 mg Nebulization BID   enoxaparin  (LOVENOX ) injection  80 mg Subcutaneous Q24H   gabapentin   600 mg Oral TID   guaiFENesin   1,200 mg Oral BID   ipratropium-albuterol   3 mL Nebulization Q6H   LORazepam   1 mg Intravenous Once   melatonin  3 mg Oral QHS   methylPREDNISolone  (SOLU-MEDROL ) injection  40 mg Intravenous Q24H   polyethylene glycol  17 g Oral BID   prazosin   1 mg Oral QHS   senna  1 tablet Oral QHS   Continuous Infusions:   LOS: 6 days    Time spent: 35 minutes.     Owen DELENA Lore, MD [facility]   If 7PM-7AM, please contact night-coverage [email or web address]  03/07/2023, 12:55 PM

## 2023-03-07 NOTE — Progress Notes (Signed)
 Physical Therapy Treatment Patient Details Name: [name] [name] MRN: [id number] DOB: 1975/06/25 Today's Date: 03/07/2023   History of Present Illness 48 yo female presents to therapy following hospital admission on 02/28/2023 due to cough, congestion, wheezing and N and V with poor PO intake as well as syncopal episode. Pt was found to have the flu and bronchitis. Pt has had several hospitalizations in the past 6 months. Pt PMH includes but is not limited to: CAD s/p PCI, CHF, depression, seizures, bipolar, vertigo, asthma, falls, PTSD, LBP, and cancer.    PT Comments   Pt admitted with above diagnosis.  Pt currently with functional limitations due to the deficits listed below (see PT Problem List). Pt in bed when therapist arrived. Pt agreeable to therapy intervention. Pt required cues and increased time with encouragement for S for supine to sit with use of hospital bed, pt indicating need for assist with L LE to EOB however able to perform task. Pt able to sit EOB and participate with ADLs no indication of LOB or dizziness. Pt required CGA x 2 for safety with sit to stand  and SPT with RW. Pt required CGA, RW, cues and recliner close for gait tasks in personal room, pt indicated mild light headedness at end of 25 feet with gait training today. Pt left seated in recliner and all needs in place, no episodes of LOC today. Pt is scheduled for MRI today. Pt will benefit from rollator at time of d/c and continued therapy services. Pt will benefit from acute skilled PT to increase their independence and safety with mobility to allow discharge.      If plan is discharge home, recommend the following: A little help with walking and/or transfers;Assistance with cooking/housework;Assist for transportation   Can travel by private vehicle        Equipment Recommendations  Rollator (4 wheels)    Recommendations for Other Services       Precautions / Restrictions Precautions Precautions:  Fall Precaution Comments: syncopal episodes, no forwarning Restrictions Weight Bearing Restrictions Per Provider Order: No     Mobility  Bed Mobility Overal bed mobility: Needs Assistance Bed Mobility: Supine to Sit     Supine to sit: HOB elevated, Used rails, Supervision     General bed mobility comments: min cues and increased time. encouragement for LLE to EOB    Transfers Overall transfer level: Needs assistance Equipment used: Rolling walker (2 wheels) Transfers: Sit to/from Stand, Bed to chair/wheelchair/BSC Sit to Stand: Contact guard assist, +2 safety/equipment   Step pivot transfers: Contact guard assist, +2 physical assistance, +2 safety/equipment       General transfer comment: pt able to perform sit to stand from EOB, gait tasks then seated in recliner, SPT from recliner <> BSC, cues for safety, limiting trunk and cervical flexion, and maintaining eyes open    Ambulation/Gait Ambulation/Gait assistance: Contact guard assist, +2 safety/equipment Gait Distance (Feet): 25 Feet Assistive device: Rolling walker (2 wheels) Gait Pattern/deviations: Step-to pattern, Wide base of support Gait velocity: decreased     General Gait Details: min cues, no overt instabilty noted, pt reported feeling a little light headed, no room spining or LOC during intervention, close chair follow. pt is requesting to remain in room due to feeling closterphobic with mask donned   Stairs             Wheelchair Mobility     Tilt Bed    Modified Rankin (Stroke Patients Only)  Balance Overall balance assessment: Needs assistance, History of Falls Sitting-balance support: Feet supported Sitting balance-Leahy Scale: Good     Standing balance support: Bilateral upper extremity supported, During functional activity Standing balance-Leahy Scale: Fair                              Cognition Arousal: Alert Behavior During Therapy: WFL for tasks  assessed/performed, Anxious Overall Cognitive Status: Within Functional Limits for tasks assessed                                          Exercises      General Comments        Pertinent Vitals/Pain Pain Assessment Pain Assessment: No/denies pain    Home Living                          Prior Function            PT Goals (current goals can now be found in the care plan section) Acute Rehab PT Goals Patient Stated Goal: to be able to not fall PT Goal Formulation: With patient Time For Goal Achievement: 03/17/23 Potential to Achieve Goals: Fair Progress towards PT goals: Progressing toward goals    Frequency    Min 1X/week      PT Plan      Co-evaluation PT/OT/SLP Co-Evaluation/Treatment: Yes Reason for Co-Treatment: Complexity of the patient's impairments (multi-system involvement);Necessary to address cognition/behavior during functional activity;For patient/therapist safety;To address functional/ADL transfers PT goals addressed during session: Mobility/safety with mobility;Balance;Proper use of DME OT goals addressed during session: ADL's and self-care;Proper use of Adaptive equipment and DME      AM-PAC PT 6 Clicks Mobility   Outcome Measure  Help needed turning from your back to your side while in a flat bed without using bedrails?: None Help needed moving from lying on your back to sitting on the side of a flat bed without using bedrails?: A Little Help needed moving to and from a bed to a chair (including a wheelchair)?: A Little Help needed standing up from a chair using your arms (e.g., wheelchair or bedside chair)?: A Little Help needed to walk in hospital room?: A Little Help needed climbing 3-5 steps with a railing? : Total 6 Click Score: 17    End of Session Equipment Utilized During Treatment: Gait belt Activity Tolerance: Patient tolerated treatment well Patient left: in chair;with call bell/phone within  reach Nurse Communication: Mobility status PT Visit Diagnosis: History of falling (Z91.81);Repeated falls (R29.6);Other abnormalities of gait and mobility (R26.89);Difficulty in walking, not elsewhere classified (R26.2);Dizziness and giddiness (R42);Pain     Time: 9040-8955 PT Time Calculation (min) (ACUTE ONLY): 45 min  Charges:    $Gait Training: 8-22 mins $Therapeutic Activity: 8-22 mins PT General Charges $$ ACUTE PT VISIT: 1 Visit                     [place], PT Acute Rehab    [place] VEAR Drone 03/07/2023, 1:25 PM
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
## 2023-03-08 DIAGNOSIS — J101 Influenza due to other identified influenza virus with other respiratory manifestations: Secondary | ICD-10-CM | POA: Diagnosis not present

## 2023-03-08 DIAGNOSIS — F3113 Bipolar disorder, current episode manic without psychotic features, severe: Secondary | ICD-10-CM

## 2023-03-08 LAB — CREATININE, SERUM
Creatinine, Ser: 0.73 mg/dL (ref 0.44–1.00)
GFR, Estimated: >60 mL/min (ref >60)

## 2023-03-08 MED ORDER — ENSURE ENLIVE PO LIQD
237.0000 mL | Freq: Two times a day (BID) | ORAL | Status: DC
  Administered 2023-03-08 – 2023-03-10 (×5): 237 mL via ORAL

## 2023-03-08 MED ORDER — LAMOTRIGINE 25 MG PO TABS
25.0000 mg | ORAL_TABLET | Freq: Two times a day (BID) | ORAL | Status: DC
  Administered 2023-03-08 – 2023-03-10 (×4): 25 mg via ORAL
  Filled 2023-03-08 (×4): qty 1

## 2023-03-08 MED ORDER — PROSIGHT PO TABS
1.0000 | ORAL_TABLET | Freq: Every day | ORAL | Status: DC
  Administered 2023-03-08 – 2023-03-10 (×3): 1 via ORAL
  Filled 2023-03-08 (×3): qty 1

## 2023-03-08 MED ORDER — PREDNISONE 20 MG PO TABS
40.0000 mg | ORAL_TABLET | Freq: Every day | ORAL | Status: DC
  Administered 2023-03-09 – 2023-03-10 (×2): 40 mg via ORAL
  Filled 2023-03-08 (×2): qty 2

## 2023-03-08 NOTE — Progress Notes (Signed)
 Physical Therapy Treatment Patient Details Name: [name] [name] MRN: [id number] DOB: August 03, 1975 Today's Date: 03/08/2023   History of Present Illness 48 yo female presents to therapy following hospital admission on 02/28/2023 due to cough, congestion, wheezing and N and V with poor PO intake as well as syncopal episode. Pt was found to have the flu and bronchitis. Pt has had several hospitalizations in the past 6 months. Pt PMH includes but is not limited to: CAD s/p PCI, CHF, depression, seizures, bipolar, vertigo, asthma, falls, PTSD, LBP, and cancer.    PT Comments  Pt received supine in bed asleep with friend visiting. Pt arousable and agreeable to be seen. Demonstrated bed mobility at supervision level utilizing bed rails and with impulsive movement, directed pt to slow movements and allow for dizziness to subside. Pt demonstrated STS from EOB/toilet/rollator seat with CGA, no physical assist required, +2 for close recliner follow for safety. Pt ambulated with bariatric rollator and CGA for 63ft, endorsing dizziness, diaphoresis, no noted nystagmus, BP 123/69 (85), SpO2 96% on RA, HR85 in sitting immediately post ambulation, 3/4 on the Dyspnea scale (able to only speak in 1-2 phrases) despite SpO2 95% via personal pulse ox. Unable to ascertain pt's living situation, pt does mention walking to the bus and not being able to cover the distance without resting, recommending bariatric rollator at this time. Discharge PT recommendation updated to HHPT per MD request. We will continue to follow acutely.    If plan is discharge home, recommend the following: A little help with walking and/or transfers;Assistance with cooking/housework;Assist for transportation   Can travel by private vehicle        Equipment Recommendations  Rollator (4 wheels);Other (comment) (Bariatric rollator [phone].)    Recommendations for Other Services       Precautions / Restrictions Precautions Precautions: Fall;Other  (comment) Precaution Comments: syncopal episodes, no forwarning Restrictions Weight Bearing Restrictions Per Provider Order: No     Mobility  Bed Mobility Overal bed mobility: Needs Assistance Bed Mobility: Supine to Sit     Supine to sit: HOB elevated, Used rails, Supervision     General bed mobility comments: Pt highly impulsive, able to mobilize herself out of bed utilizing bed rails with HOB elevated, pt nearly rolled out of bed and PT provided CGA but no physical assist to arrest roll, pt able to do so herself, instructed pt to slow down and take her time.    Transfers Overall transfer level: Needs assistance Equipment used: Rolling walker (2 wheels) Transfers: Sit to/from Stand Sit to Stand: Contact guard assist, +2 safety/equipment           General transfer comment: pt able to perform sit to stand from EOB, gait tasks then seated in recliner, SPT from recliner/rollator/toilet, cues for safety, limiting trunk and cervical flexion, and maintaining eyes open    Ambulation/Gait Ambulation/Gait assistance: Contact guard assist, +2 safety/equipment Gait Distance (Feet): 25 Feet Assistive device: Rollator (4 wheels) (Bari rollator) Gait Pattern/deviations: Step-to pattern, Wide base of support Gait velocity: decreased     General Gait Details: min cues, no overt instabilty noted, pt reported feeling a little light headed, no room spining or LOC during intervention, close chair follow, two gait bouts of ~64ft and single bout of ~15   Stairs             Wheelchair Mobility     Tilt Bed    Modified Rankin (Stroke Patients Only)       Balance Overall balance assessment:  Needs assistance, History of Falls Sitting-balance support: Feet supported Sitting balance-Leahy Scale: Good     Standing balance support: Bilateral upper extremity supported, During functional activity Standing balance-Leahy Scale: Poor                               Cognition Arousal: Alert Behavior During Therapy: WFL for tasks assessed/performed, Anxious Overall Cognitive Status: Within Functional Limits for tasks assessed                                 General Comments: highly IMPULSIVE        Exercises      General Comments        Pertinent Vitals/Pain Pain Assessment Pain Assessment: 0-10 Pain Score: 5  Pain Location: LLE Pain Descriptors / Indicators: Aching Pain Intervention(s): Repositioned, Monitored during session, Limited activity within patient's tolerance    Home Living                          Prior Function            PT Goals (current goals can now be found in the care plan section) Acute Rehab PT Goals Patient Stated Goal: to be able to not fall PT Goal Formulation: With patient Time For Goal Achievement: 03/17/23 Potential to Achieve Goals: Fair Progress towards PT goals: Progressing toward goals    Frequency    Min 1X/week      PT Plan      Co-evaluation              AM-PAC PT 6 Clicks Mobility   Outcome Measure  Help needed turning from your back to your side while in a flat bed without using bedrails?: None Help needed moving from lying on your back to sitting on the side of a flat bed without using bedrails?: A Little Help needed moving to and from a bed to a chair (including a wheelchair)?: A Little Help needed standing up from a chair using your arms (e.g., wheelchair or bedside chair)?: A Little Help needed to walk in hospital room?: A Little Help needed climbing 3-5 steps with a railing? : Total 6 Click Score: 17    End of Session Equipment Utilized During Treatment: Gait belt Activity Tolerance: Patient tolerated treatment well Patient left: in chair;with call bell/phone within reach;with family/visitor present Nurse Communication: Mobility status PT Visit Diagnosis: History of falling (Z91.81);Repeated falls (R29.6);Other abnormalities of gait and  mobility (R26.89);Difficulty in walking, not elsewhere classified (R26.2);Dizziness and giddiness (R42);Pain Pain - Right/Left: Left Pain - part of body: Leg     Time: 1415-1431 PT Time Calculation (min) (ACUTE ONLY): 16 min  Charges:    $Gait Training: 8-22 mins PT General Charges $$ ACUTE PT VISIT: 1 Visit                     Elsie Grieves, PT, DPT [facility] Department Office: [phone]   Elsie Grieves 03/08/2023, 3:42 PM
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
## 2023-03-08 NOTE — Progress Notes (Signed)
 PROGRESS NOTE    [name] [name]  FMW:[id number] DOB: 05-04-1975 DOA: 02/28/2023 PCP: Gregary Sharper, MD   Brief Narrative: 48 year old with past medical history significant for CAD s/p PCI, CHF, depression, history of seizure , who presents with cough, congestion, wheezing admitted for severe bronchitis, found to have influenza A.  Found to have multiple episode of syncope in the hospital   Assessment & Plan:   Principal Problem:   Influenza A Active Problems:   HTN (hypertension)   MDD (major depressive disorder), recurrent severe, without psychosis (HCC)   CHF (congestive heart failure) (HCC)   Syncope   Acute bronchitis   CAD S/P percutaneous coronary angioplasty   BMI 60.0-69.9, adult (HCC)   Morbid obesity with BMI of 60.0-69.9, adult (HCC)   1-Acute bronchitis with influenza A -Did not receive Tamiflu given onset of symptoms more than 5 days. -Continue with the scheduled DuoNeb -Continue guaifenesin , Brovana  and Pulmicort  Improved. Plan to change IV solumedrol to prednisone   2-Syncope: -? vasovagal in the setting of poor oral intake dehydration and influenza, Vertigo related syncope?.  -Echo without significant abnormalities. -Reported episode of syncope observed by staff onto his last 2/3 -No events noticed on telemetry, Cardiology sign off.  -Orthostatic Vital negative.  -Discussed with Neurology, recommendation for CTA head and neck to look for large vessel occlusion, vertebrobasilar stenosis, subclavian steal syndrome.  CTA negative for large vessel occlusion. -MRI negative -If her syncopal is positional she might need follow-up with ENT  Transaminases: In the setting of viral illness  CAD status post PCI stable  Depression, bipolar, borderline schizophrenia: Psych  was consulted patient reporter auditory hallucination Appreciate psych assistance.  Started on Abilify  and prazosin   Mild hypokalemia: Resolved  Seizure: Takes gabapentin   Morbid  obesity BMI 65: Need lifestyle modification     Estimated body mass index is 65.32 kg/m as calculated from the following:   Height as of this encounter: 5' 2.25 (1.581 m).   Weight as of this encounter: 163.3 kg.   DVT prophylaxis: Lovenox  Code Status: Full code Family Communication: Care discussed with patient Disposition Plan:  Status is: Inpatient Remains inpatient appropriate because: Management of acute bronchitis and recurrent syncope Plan for discharge tomorrow , patient to continue with training for mobility   Consultants:  Cardiology  Procedures:  Echo  Antimicrobials:    Subjective: She is feeling better, breathing better. Asking for multivitamins Think she could go home tomorrow.  Objective: Vitals:   03/08/23 0511 03/08/23 1007 03/08/23 1011 03/08/23 1014  BP: 137/83     Pulse: (!) 57     Resp: 18     Temp: 98.1 F (36.7 C)     TempSrc:      SpO2: 93% 95% 95% 95%  Weight:      Height:       No intake or output data in the 24 hours ending 03/08/23 1125  Filed Weights   02/28/23 1609  Weight: (!) 163.3 kg    Examination:  General exam: NAD Respiratory system: BL air movement.  Cardiovascular system: S 1, S 2 RRR Gastrointestinal system: BS present, soft, nt Central nervous system: alert, able top move all extremities, but weaker on left chronic    Data Reviewed: I have personally reviewed following labs and imaging studies  CBC: No results for input(s): WBC, NEUTROABS, HGB, HCT, MCV, PLT in the last 168 hours.  Basic Metabolic Panel: Recent Labs  Lab 03/03/23 0748 03/05/23 0536 03/08/23 0738  NA 140 139  --  K 4.7 4.5  --   CL 109 105  --   CO2 25 25  --   GLUCOSE 115* 85  --   BUN 19 21*  --   CREATININE 0.68 0.43* 0.73  CALCIUM  8.8* 8.7*  --   MG  --  2.4  --   PHOS  --  3.4  --    GFR: Estimated Creatinine Clearance: 128.5 mL/min (by C-G formula based on SCr of 0.73 mg/dL). Liver Function Tests: Recent  Labs  Lab 03/03/23 0748  AST 35  ALT 162*  ALKPHOS 111  BILITOT 0.5  PROT 7.1  ALBUMIN 3.0*   No results for input(s): LIPASE, AMYLASE in the last 168 hours. No results for input(s): AMMONIA in the last 168 hours. Coagulation Profile: No results for input(s): INR, PROTIME in the last 168 hours. Cardiac Enzymes: No results for input(s): CKTOTAL, CKMB, CKMBINDEX, TROPONINI in the last 168 hours. BNP (last 3 results) No results for input(s): PROBNP in the last 8760 hours. HbA1C: No results for input(s): HGBA1C in the last 72 hours. CBG: Recent Labs  Lab 03/03/23 1056  GLUCAP 113*   Lipid Profile: No results for input(s): CHOL, HDL, LDLCALC, TRIG, CHOLHDL, LDLDIRECT in the last 72 hours. Thyroid Function Tests: No results for input(s): TSH, T4TOTAL, FREET4, T3FREE, THYROIDAB in the last 72 hours. Anemia Panel: No results for input(s): VITAMINB12, FOLATE, FERRITIN, TIBC, IRON, RETICCTPCT in the last 72 hours. Sepsis Labs: No results for input(s): PROCALCITON, LATICACIDVEN in the last 168 hours.  Recent Results (from the past 240 hours)  SARS Coronavirus 2 by RT PCR (hospital order, performed in [facility] hospital lab) *cepheid single result test* Anterior Nasal Swab     Status: None   Collection Time: 02/28/23  4:35 PM   Specimen: Anterior Nasal Swab  Result Value Ref Range Status   SARS Coronavirus 2 by RT PCR NEGATIVE NEGATIVE Final    Comment: (NOTE) SARS-CoV-2 target nucleic acids are NOT DETECTED.  The SARS-CoV-2 RNA is generally detectable in upper and lower respiratory specimens during the acute phase of infection. The lowest concentration of SARS-CoV-2 viral copies this assay can detect is 250 copies / mL. A negative result does not preclude SARS-CoV-2 infection and should not be used as the sole basis for treatment or other patient management decisions.  A negative result may occur with improper  specimen collection / handling, submission of specimen other than nasopharyngeal swab, presence of viral mutation(s) within the areas targeted by this assay, and inadequate number of viral copies (<250 copies / mL). A negative result must be combined with clinical observations, patient history, and epidemiological information.  Fact Sheet for Patients:   roadlaptop.co.za  Fact Sheet for Healthcare Providers: http://kim-miller.com/  This test is not yet approved or  cleared by the [place]  FDA and has been authorized for detection and/or diagnosis of SARS-CoV-2 by FDA under an Emergency Use Authorization (EUA).  This EUA will remain in effect (meaning this test can be used) for the duration of the COVID-19 declaration under Section 564(b)(1) of the Act, 21 U.S.C. section 360bbb-3(b)(1), unless the authorization is terminated or revoked sooner.  Performed at [facility], [address] [place], [place] [address]   Resp panel by RT-PCR (RSV, Flu A&B, Covid) Anterior Nasal Swab     Status: Abnormal   Collection Time: 02/28/23  7:27 PM   Specimen: Anterior Nasal Swab  Result Value Ref Range Status   SARS Coronavirus 2 by RT  PCR NEGATIVE NEGATIVE Final    Comment: (NOTE) SARS-CoV-2 target nucleic acids are NOT DETECTED.  The SARS-CoV-2 RNA is generally detectable in upper respiratory specimens during the acute phase of infection. The lowest concentration of SARS-CoV-2 viral copies this assay can detect is 138 copies/mL. A negative result does not preclude SARS-Cov-2 infection and should not be used as the sole basis for treatment or other patient management decisions. A negative result may occur with  improper specimen collection/handling, submission of specimen other than nasopharyngeal swab, presence of viral mutation(s) within the areas targeted by this assay, and inadequate number of viral copies(<138  copies/mL). A negative result must be combined with clinical observations, patient history, and epidemiological information. The expected result is Negative.  Fact Sheet for Patients:  bloggercourse.com  Fact Sheet for Healthcare Providers:  seriousbroker.it  This test is no t yet approved or cleared by the [place]  FDA and  has been authorized for detection and/or diagnosis of SARS-CoV-2 by FDA under an Emergency Use Authorization (EUA). This EUA will remain  in effect (meaning this test can be used) for the duration of the COVID-19 declaration under Section 564(b)(1) of the Act, 21 U.S.C.section 360bbb-3(b)(1), unless the authorization is terminated  or revoked sooner.       Influenza A by PCR POSITIVE (A) NEGATIVE Final   Influenza B by PCR NEGATIVE NEGATIVE Final    Comment: (NOTE) The Xpert Xpress SARS-CoV-2/FLU/RSV plus assay is intended as an aid in the diagnosis of influenza from Nasopharyngeal swab specimens and should not be used as a sole basis for treatment. Nasal washings and aspirates are unacceptable for Xpert Xpress SARS-CoV-2/FLU/RSV testing.  Fact Sheet for Patients: bloggercourse.com  Fact Sheet for Healthcare Providers: seriousbroker.it  This test is not yet approved or cleared by the [place]  FDA and has been authorized for detection and/or diagnosis of SARS-CoV-2 by FDA under an Emergency Use Authorization (EUA). This EUA will remain in effect (meaning this test can be used) for the duration of the COVID-19 declaration under Section 564(b)(1) of the Act, 21 U.S.C. section 360bbb-3(b)(1), unless the authorization is terminated or revoked.     Resp Syncytial Virus by PCR NEGATIVE NEGATIVE Final    Comment: (NOTE) Fact Sheet for Patients: bloggercourse.com  Fact Sheet for Healthcare  Providers: seriousbroker.it  This test is not yet approved or cleared by the [place]  FDA and has been authorized for detection and/or diagnosis of SARS-CoV-2 by FDA under an Emergency Use Authorization (EUA). This EUA will remain in effect (meaning this test can be used) for the duration of the COVID-19 declaration under Section 564(b)(1) of the Act, 21 U.S.C. section 360bbb-3(b)(1), unless the authorization is terminated or revoked.  Performed at [facility], [address], [place], [place] [address]          Radiology Studies: MR BRAIN WO CONTRAST Result Date: 03/07/2023 CLINICAL DATA:  Initial evaluation for acute syncope/presyncope, CVA suspected. EXAM: MRI HEAD WITHOUT CONTRAST TECHNIQUE: Multiplanar, multiecho pulse sequences of the brain and surrounding structures were obtained without intravenous contrast. COMPARISON:  CT from 03/06/2023. FINDINGS: Brain: Cerebral volume within normal limits. Scattered patchy T2/FLAIR hyperintensity involving the supratentorial cerebral white matter, most likely related chronic microvascular ischemic disease, mild in nature. No evidence for acute or subacute infarct. Gray-white matter differentiation maintained. No areas of chronic cortical infarction. No acute or chronic intracranial products. No mass lesion, midline shift or mass effect. No hydrocephalus or extra-axial fluid collection. Pituitary gland and suprasellar region within  normal limits. Vascular: Major intracranial vascular flow voids are maintained. Skull and upper cervical spine: Craniocervical junction within normal limits. Decreased T1 signal intensity noted within the visualized bone marrow, nonspecific, but most commonly related to anemia, smoking or obesity. No scalp soft tissue abnormality. Sinuses/Orbits: Globes and orbital soft tissues within normal limits. Scattered mucosal thickening noted throughout the paranasal sinuses. No  significant mastoid effusion. Other: None. IMPRESSION: 1. No acute intracranial abnormality. 2. Mild chronic microvascular ischemic disease. Electronically Signed   By: Morene Hoard M.D.   On: 03/07/2023 21:14        Scheduled Meds:  arformoterol   15 mcg Nebulization BID   ARIPiprazole   15 mg Oral Daily   aspirin  EC  81 mg Oral Daily   atorvastatin   40 mg Oral Daily   azithromycin   500 mg Oral Daily   benzonatate   200 mg Oral TID   budesonide  (PULMICORT ) nebulizer solution  0.25 mg Nebulization BID   enoxaparin  (LOVENOX ) injection  80 mg Subcutaneous Q24H   feeding supplement  237 mL Oral BID BM   gabapentin   600 mg Oral TID   guaiFENesin   1,200 mg Oral BID   ipratropium-albuterol   3 mL Nebulization TID   melatonin  3 mg Oral QHS   multivitamin  1 tablet Oral Daily   polyethylene glycol  17 g Oral BID   prazosin   1 mg Oral QHS   [START ON 03/09/2023] predniSONE   40 mg Oral Q breakfast   senna  1 tablet Oral QHS   Continuous Infusions:   LOS: 7 days    Time spent: 35 minutes.     Owen DELENA Lore, MD [facility]   If 7PM-7AM, please contact night-coverage [email or web address]  03/08/2023, 11:25 AM

## 2023-03-09 ENCOUNTER — Inpatient Hospital Stay (HOSPITAL_COMMUNITY): Payer: MEDICAID

## 2023-03-09 DIAGNOSIS — J101 Influenza due to other identified influenza virus with other respiratory manifestations: Secondary | ICD-10-CM | POA: Diagnosis not present

## 2023-03-09 DIAGNOSIS — R55 Syncope and collapse: Secondary | ICD-10-CM | POA: Diagnosis not present

## 2023-03-09 MED ORDER — ARIPIPRAZOLE ER 400 MG IM SRER
400.0000 mg | INTRAMUSCULAR | Status: AC
  Administered 2023-03-09: 400 mg via INTRAMUSCULAR
  Filled 2023-03-09: qty 2

## 2023-03-09 NOTE — Progress Notes (Signed)
 Occupational Therapy Treatment Patient Details Name: [name] [name] MRN: [id number] DOB: 18-Sep-1975 Today's Date: 03/09/2023   History of present illness 48 yo female presents to therapy following hospital admission on 02/28/2023 due to cough, congestion, wheezing and N and V with poor PO intake as well as syncopal episode. Pt was found to have the flu and bronchitis. Pt has had several hospitalizations in the past 6 months. Pt PMH includes but is not limited to: CAD s/p PCI, CHF, depression, seizures, bipolar, vertigo, asthma, falls, PTSD, LBP, and cancer.   OT comments  AE issued and education provided      If plan is discharge home, recommend the following:  A little help with walking and/or transfers;Assistance with cooking/housework;Assist for transportation;Help with stairs or ramp for entrance;A little help with bathing/dressing/bathroom   Equipment Recommendations  BSC/3in1    Recommendations for Other Services      Precautions / Restrictions Precautions Precautions: Fall;Other (comment) Precaution Comments: syncopal episodes, no forwarning Restrictions Weight Bearing Restrictions Per Provider Order: No       Mobility Bed Mobility   Bed Mobility: Supine to Sit     Supine to sit: HOB elevated, Used rails, Supervision Sit to supine: Min assist   General bed mobility comments: Pt relies heavily on rails. Pt will only have a regular bed in her pallet home.  Discussed strategies. Anticipate this will be challenging    Transfers     Transfers: Sit to/from Stand Sit to Stand: Contact guard assist     Step pivot transfers: Contact guard assist           Balance Overall balance assessment: Needs assistance, History of Falls Sitting-balance support: Feet supported Sitting balance-Leahy Scale: Good     Standing balance support: Bilateral upper extremity supported, During functional activity Standing balance-Leahy Scale: Poor                              ADL either performed or assessed with clinical judgement   ADL                       Lower Body Dressing: Minimal assistance Lower Body Dressing Details (indicate cue type and reason): issued reacher and sock aid and educated in use of both. Pt feels this will be helpful with LB dressing.               General ADL Comments: Discussed options for ADL activity in pallet comunity where pt lives. AE will be helpful.  Pt will benefit from [facility] as she has to go outside to the bathroom.    Extremity/Trunk Assessment Upper Extremity Assessment Upper Extremity Assessment: Generalized weakness             Cognition Arousal: Alert Behavior During Therapy: WFL for tasks assessed/performed, Anxious Overall Cognitive Status: Within Functional Limits for tasks assessed                                 General Comments: highly IMPULSIVE                   Pertinent Vitals/ Pain       Pain Assessment Pain Score: 3  Pain Location: LLE Pain Descriptors / Indicators: Aching Pain Intervention(s): Limited activity within patient's tolerance, Monitored during session, Repositioned   Frequency  Min 2X/week  Progress Toward Goals  OT Goals(current goals can now be found in the care plan section)  Progress towards OT goals: Progressing toward goals      AM-PAC OT 6 Clicks Daily Activity     Outcome Measure   Help from another person eating meals?: None Help from another person taking care of personal grooming?: A Little Help from another person toileting, which includes using toliet, bedpan, or urinal?: A Lot Help from another person bathing (including washing, rinsing, drying)?: A Lot Help from another person to put on and taking off regular upper body clothing?: A Little Help from another person to put on and taking off regular lower body clothing?: A Lot 6 Click Score: 16    End of Session Equipment Utilized During Treatment:  Rolling walker (2 wheels)  OT Visit Diagnosis: Unsteadiness on feet (R26.81);Other abnormalities of gait and mobility (R26.89);History of falling (Z91.81);Dizziness and giddiness (R42)   Activity Tolerance Patient tolerated treatment well   Patient Left in bed;with call bell/phone within reach   Nurse Communication          Time: 8774-8754 OT Time Calculation (min): 20 min  Charges: OT General Charges $OT Visit: 1 Visit OT Treatments $Self Care/Home Management : 8-22 mins    Teige Rountree, Norvel BIRCH 03/09/2023, 2:12 PM

## 2023-03-09 NOTE — Plan of Care (Signed)

## 2023-03-09 NOTE — Progress Notes (Signed)
 PROGRESS NOTE    [name] [name]  FMW:[id number] DOB: 1975/12/20 DOA: 02/28/2023 PCP: Gregary Sharper, MD   Brief Narrative: 48 year old with past medical history significant for CAD s/p PCI, CHF, depression, history of seizure , who presents with cough, congestion, wheezing admitted for severe bronchitis, found to have influenza A.  Found to have multiple episode of syncope in the hospital   Assessment & Plan:   Principal Problem:   Influenza A Active Problems:   HTN (hypertension)   MDD (major depressive disorder), recurrent severe, without psychosis (HCC)   CHF (congestive heart failure) (HCC)   Syncope   Acute bronchitis   CAD S/P percutaneous coronary angioplasty   BMI 60.0-69.9, adult (HCC)   Morbid obesity with BMI of 60.0-69.9, adult (HCC)   Bipolar I disorder, current or most recent episode manic, severe (HCC)   1-Acute bronchitis with influenza A -Did not receive Tamiflu given onset of symptoms more than 5 days. -Continue with the scheduled DuoNeb -Continue guaifenesin , Brovana  and Pulmicort  Improved. Plan to change IV solumedrol to prednisone   2-Syncope: -? vasovagal in the setting of poor oral intake dehydration and influenza, Vertigo related syncope?.  -Echo without significant abnormalities. -Reported episode of syncope observed by staff onto his last 2/3 -No events noticed on telemetry, Cardiology sign off.  -Orthostatic Vital negative.  -Discussed with Neurology, recommendation for CTA head and neck to look for large vessel occlusion, vertebrobasilar stenosis, subclavian steal syndrome.  CTA negative for large vessel occlusion. -MRI negative -If her syncopal is positional she might need follow-up with ENT Stable, no recent episodes.   Cough, Chocking episode.  She suddenly develops worsening dyspnea, was chocking on crackers.  After strenuous cough and palpation to her back she was feeling better.  Plan to check chest x ray  Received a nebulizer  tx  Transaminases: In the setting of viral illness  CAD status post PCI stable  Depression, bipolar, borderline schizophrenia: Psych  was consulted patient reporter auditory hallucination Appreciate psych assistance.  Started on Abilify  and prazosin   Mild hypokalemia: Resolved  Seizure: Takes gabapentin   Morbid obesity BMI 65: Need lifestyle modification     Estimated body mass index is 65.32 kg/m as calculated from the following:   Height as of this encounter: 5' 2.25 (1.581 m).   Weight as of this encounter: 163.3 kg.   DVT prophylaxis: Lovenox  Code Status: Full code Family Communication: Care discussed with patient Disposition Plan:  Status is: Inpatient Remains inpatient appropriate because: Management of acute bronchitis and recurrent syncope Plan for discharge tomorrow , patient to continue with training for mobility   Consultants:  Cardiology  Procedures:  Echo  Antimicrobials:    Subjective: She was eating cracker and got chock.  Send message to Psych to see if they can give Abilify  injection today, with goal to discharge tomorrow   Objective: Vitals:   03/09/23 0923 03/09/23 0925 03/09/23 1050 03/09/23 1141  BP:    133/75  Pulse:    75  Resp:    20  Temp:    98 F (36.7 C)  TempSrc:      SpO2: 97% 97% 96% 98%  Weight:      Height:       No intake or output data in the 24 hours ending 03/09/23 1334  Filed Weights   02/28/23 1609  Weight: (!) 163.3 kg    Examination:  General exam: NAD Respiratory system: BL air movement Cardiovascular system: S 1, S 2 RRR Gastrointestinal system: BS present,  soft,  nt Central nervous system: alert, able top move all extremities, but weaker on left chronic    Data Reviewed: I have personally reviewed following labs and imaging studies  CBC: No results for input(s): WBC, NEUTROABS, HGB, HCT, MCV, PLT in the last 168 hours.  Basic Metabolic Panel: Recent Labs  Lab 03/03/23 0748  03/05/23 0536 03/08/23 0738  NA 140 139  --   K 4.7 4.5  --   CL 109 105  --   CO2 25 25  --   GLUCOSE 115* 85  --   BUN 19 21*  --   CREATININE 0.68 0.43* 0.73  CALCIUM  8.8* 8.7*  --   MG  --  2.4  --   PHOS  --  3.4  --    GFR: Estimated Creatinine Clearance: 128.5 mL/min (by C-G formula based on SCr of 0.73 mg/dL). Liver Function Tests: Recent Labs  Lab 03/03/23 0748  AST 35  ALT 162*  ALKPHOS 111  BILITOT 0.5  PROT 7.1  ALBUMIN 3.0*   No results for input(s): LIPASE, AMYLASE in the last 168 hours. No results for input(s): AMMONIA in the last 168 hours. Coagulation Profile: No results for input(s): INR, PROTIME in the last 168 hours. Cardiac Enzymes: No results for input(s): CKTOTAL, CKMB, CKMBINDEX, TROPONINI in the last 168 hours. BNP (last 3 results) No results for input(s): PROBNP in the last 8760 hours. HbA1C: No results for input(s): HGBA1C in the last 72 hours. CBG: Recent Labs  Lab 03/03/23 1056  GLUCAP 113*   Lipid Profile: No results for input(s): CHOL, HDL, LDLCALC, TRIG, CHOLHDL, LDLDIRECT in the last 72 hours. Thyroid Function Tests: No results for input(s): TSH, T4TOTAL, FREET4, T3FREE, THYROIDAB in the last 72 hours. Anemia Panel: No results for input(s): VITAMINB12, FOLATE, FERRITIN, TIBC, IRON, RETICCTPCT in the last 72 hours. Sepsis Labs: No results for input(s): PROCALCITON, LATICACIDVEN in the last 168 hours.  Recent Results (from the past 240 hours)  SARS Coronavirus 2 by RT PCR (hospital order, performed in [facility] hospital lab) *cepheid single result test* Anterior Nasal Swab     Status: None   Collection Time: 02/28/23  4:35 PM   Specimen: Anterior Nasal Swab  Result Value Ref Range Status   SARS Coronavirus 2 by RT PCR NEGATIVE NEGATIVE Final    Comment: (NOTE) SARS-CoV-2 target nucleic acids are NOT DETECTED.  The SARS-CoV-2 RNA is generally detectable in upper and  lower respiratory specimens during the acute phase of infection. The lowest concentration of SARS-CoV-2 viral copies this assay can detect is 250 copies / mL. A negative result does not preclude SARS-CoV-2 infection and should not be used as the sole basis for treatment or other patient management decisions.  A negative result may occur with improper specimen collection / handling, submission of specimen other than nasopharyngeal swab, presence of viral mutation(s) within the areas targeted by this assay, and inadequate number of viral copies (<250 copies / mL). A negative result must be combined with clinical observations, patient history, and epidemiological information.  Fact Sheet for Patients:   roadlaptop.co.za  Fact Sheet for Healthcare Providers: http://kim-miller.com/  This test is not yet approved or  cleared by the [place]  FDA and has been authorized for detection and/or diagnosis of SARS-CoV-2 by FDA under an Emergency Use Authorization (EUA).  This EUA will remain in effect (meaning this test can be used) for the duration of the COVID-19 declaration under Section 564(b)(1) of the Act, 21 U.S.C.  section 360bbb-3(b)(1), unless the authorization is terminated or revoked sooner.  Performed at [facility], [address], [place], [place] [address]   Resp panel by RT-PCR (RSV, Flu A&B, Covid) Anterior Nasal Swab     Status: Abnormal   Collection Time: 02/28/23  7:27 PM   Specimen: Anterior Nasal Swab  Result Value Ref Range Status   SARS Coronavirus 2 by RT PCR NEGATIVE NEGATIVE Final    Comment: (NOTE) SARS-CoV-2 target nucleic acids are NOT DETECTED.  The SARS-CoV-2 RNA is generally detectable in upper respiratory specimens during the acute phase of infection. The lowest concentration of SARS-CoV-2 viral copies this assay can detect is 138 copies/mL. A negative result does not preclude  SARS-Cov-2 infection and should not be used as the sole basis for treatment or other patient management decisions. A negative result may occur with  improper specimen collection/handling, submission of specimen other than nasopharyngeal swab, presence of viral mutation(s) within the areas targeted by this assay, and inadequate number of viral copies(<138 copies/mL). A negative result must be combined with clinical observations, patient history, and epidemiological information. The expected result is Negative.  Fact Sheet for Patients:  bloggercourse.com  Fact Sheet for Healthcare Providers:  seriousbroker.it  This test is no t yet approved or cleared by the [place]  FDA and  has been authorized for detection and/or diagnosis of SARS-CoV-2 by FDA under an Emergency Use Authorization (EUA). This EUA will remain  in effect (meaning this test can be used) for the duration of the COVID-19 declaration under Section 564(b)(1) of the Act, 21 U.S.C.section 360bbb-3(b)(1), unless the authorization is terminated  or revoked sooner.       Influenza A by PCR POSITIVE (A) NEGATIVE Final   Influenza B by PCR NEGATIVE NEGATIVE Final    Comment: (NOTE) The Xpert Xpress SARS-CoV-2/FLU/RSV plus assay is intended as an aid in the diagnosis of influenza from Nasopharyngeal swab specimens and should not be used as a sole basis for treatment. Nasal washings and aspirates are unacceptable for Xpert Xpress SARS-CoV-2/FLU/RSV testing.  Fact Sheet for Patients: bloggercourse.com  Fact Sheet for Healthcare Providers: seriousbroker.it  This test is not yet approved or cleared by the [place]  FDA and has been authorized for detection and/or diagnosis of SARS-CoV-2 by FDA under an Emergency Use Authorization (EUA). This EUA will remain in effect (meaning this test can be used) for the duration of  the COVID-19 declaration under Section 564(b)(1) of the Act, 21 U.S.C. section 360bbb-3(b)(1), unless the authorization is terminated or revoked.     Resp Syncytial Virus by PCR NEGATIVE NEGATIVE Final    Comment: (NOTE) Fact Sheet for Patients: bloggercourse.com  Fact Sheet for Healthcare Providers: seriousbroker.it  This test is not yet approved or cleared by the [place]  FDA and has been authorized for detection and/or diagnosis of SARS-CoV-2 by FDA under an Emergency Use Authorization (EUA). This EUA will remain in effect (meaning this test can be used) for the duration of the COVID-19 declaration under Section 564(b)(1) of the Act, 21 U.S.C. section 360bbb-3(b)(1), unless the authorization is terminated or revoked.  Performed at [facility], [address], [place], [place] [address]          Radiology Studies: MR BRAIN WO CONTRAST Result Date: 03/07/2023 CLINICAL DATA:  Initial evaluation for acute syncope/presyncope, CVA suspected. EXAM: MRI HEAD WITHOUT CONTRAST TECHNIQUE: Multiplanar, multiecho pulse sequences of the brain and surrounding structures were obtained without intravenous contrast. COMPARISON:  CT from 03/06/2023.  FINDINGS: Brain: Cerebral volume within normal limits. Scattered patchy T2/FLAIR hyperintensity involving the supratentorial cerebral white matter, most likely related chronic microvascular ischemic disease, mild in nature. No evidence for acute or subacute infarct. Gray-white matter differentiation maintained. No areas of chronic cortical infarction. No acute or chronic intracranial products. No mass lesion, midline shift or mass effect. No hydrocephalus or extra-axial fluid collection. Pituitary gland and suprasellar region within normal limits. Vascular: Major intracranial vascular flow voids are maintained. Skull and upper cervical spine: Craniocervical junction within normal  limits. Decreased T1 signal intensity noted within the visualized bone marrow, nonspecific, but most commonly related to anemia, smoking or obesity. No scalp soft tissue abnormality. Sinuses/Orbits: Globes and orbital soft tissues within normal limits. Scattered mucosal thickening noted throughout the paranasal sinuses. No significant mastoid effusion. Other: None. IMPRESSION: 1. No acute intracranial abnormality. 2. Mild chronic microvascular ischemic disease. Electronically Signed   By: Morene Hoard M.D.   On: 03/07/2023 21:14        Scheduled Meds:  arformoterol   15 mcg Nebulization BID   ARIPiprazole   15 mg Oral Daily   aspirin  EC  81 mg Oral Daily   atorvastatin   40 mg Oral Daily   benzonatate   200 mg Oral TID   budesonide  (PULMICORT ) nebulizer solution  0.25 mg Nebulization BID   enoxaparin  (LOVENOX ) injection  80 mg Subcutaneous Q24H   feeding supplement  237 mL Oral BID BM   gabapentin   600 mg Oral TID   guaiFENesin   1,200 mg Oral BID   ipratropium-albuterol   3 mL Nebulization TID   lamoTRIgine   25 mg Oral BID   melatonin  3 mg Oral QHS   multivitamin  1 tablet Oral Daily   polyethylene glycol  17 g Oral BID   prazosin   1 mg Oral QHS   predniSONE   40 mg Oral Q breakfast   senna  1 tablet Oral QHS   Continuous Infusions:   LOS: 8 days    Time spent: 35 minutes.     Owen DELENA Lore, MD [facility]   If 7PM-7AM, please contact night-coverage [email or web address]  03/09/2023, 1:34 PM

## 2023-03-09 NOTE — Plan of Care (Signed)
  Problem: Education: Goal: Knowledge of General Education information will improve Description: Including pain rating scale, medication(s)/side effects and non-pharmacologic comfort measures Outcome: Progressing   Problem: Health Behavior/Discharge Planning: Goal: Ability to manage health-related needs will improve Outcome: Progressing   Problem: Clinical Measurements: Goal: Will remain free from infection Outcome: Progressing   Problem: Elimination: Goal: Will not experience complications related to bowel motility Outcome: Progressing   Problem: Pain Managment: Goal: General experience of comfort will improve and/or be controlled Outcome: Progressing   Problem: Safety: Goal: Ability to remain free from injury will improve Outcome: Progressing
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
## 2023-03-10 DIAGNOSIS — J101 Influenza due to other identified influenza virus with other respiratory manifestations: Secondary | ICD-10-CM | POA: Diagnosis not present

## 2023-03-10 MED ORDER — ALBUTEROL SULFATE HFA 108 (90 BASE) MCG/ACT IN AERS: 2.0000 | INHALATION_SPRAY | Freq: Four times a day (QID) | RESPIRATORY_TRACT | 0 refills | Status: DC | PRN

## 2023-03-10 MED ORDER — MECLIZINE HCL 25 MG PO TABS: 25.0000 mg | ORAL_TABLET | Freq: Three times a day (TID) | ORAL | 0 refills | Status: AC | PRN

## 2023-03-10 MED ORDER — ARIPIPRAZOLE 15 MG PO TABS: 15.0000 mg | ORAL_TABLET | Freq: Every day | ORAL | 0 refills | Status: AC

## 2023-03-10 MED ORDER — BENZONATATE 200 MG PO CAPS: 200.0000 mg | ORAL_CAPSULE | Freq: Three times a day (TID) | ORAL | 0 refills | Status: AC

## 2023-03-10 MED ORDER — ARFORMOTEROL TARTRATE 15 MCG/2ML IN NEBU: 15.0000 ug | INHALATION_SOLUTION | Freq: Two times a day (BID) | RESPIRATORY_TRACT | 0 refills | Status: DC

## 2023-03-10 MED ORDER — LOPERAMIDE HCL 2 MG PO CAPS: 2.0000 mg | ORAL_CAPSULE | ORAL | 0 refills | Status: AC | PRN

## 2023-03-10 MED ORDER — PROSIGHT PO TABS: 1.0000 | ORAL_TABLET | Freq: Every day | ORAL | 0 refills | Status: AC

## 2023-03-10 MED ORDER — PREDNISONE 20 MG PO TABS: 40.0000 mg | ORAL_TABLET | Freq: Every day | ORAL | 0 refills | Status: AC

## 2023-03-10 MED ORDER — LAMOTRIGINE 25 MG PO TABS: 25.0000 mg | ORAL_TABLET | Freq: Two times a day (BID) | ORAL | 1 refills | Status: DC

## 2023-03-10 MED ORDER — ASPIRIN 81 MG PO TBEC
81.0000 mg | DELAYED_RELEASE_TABLET | Freq: Every day | ORAL | 12 refills | Status: AC
Start: 2023-03-11 — End: ?

## 2023-03-10 MED ORDER — SACCHAROMYCES BOULARDII 250 MG PO CAPS: 250.0000 mg | ORAL_CAPSULE | Freq: Two times a day (BID) | ORAL | 0 refills | Status: AC

## 2023-03-10 MED ORDER — OXYCODONE-ACETAMINOPHEN 5-325 MG PO TABS: 1.0000 | ORAL_TABLET | Freq: Three times a day (TID) | ORAL | 0 refills | Status: AC | PRN

## 2023-03-10 MED ORDER — LOPERAMIDE HCL 2 MG PO CAPS
2.0000 mg | ORAL_CAPSULE | ORAL | Status: DC | PRN
  Administered 2023-03-10: 2 mg via ORAL
  Filled 2023-03-10: qty 1

## 2023-03-10 MED ORDER — PRAZOSIN HCL 1 MG PO CAPS: 1.0000 mg | ORAL_CAPSULE | Freq: Every day | ORAL | 2 refills | Status: DC

## 2023-03-10 MED ORDER — GUAIFENESIN ER 600 MG PO TB12: 1200.0000 mg | ORAL_TABLET | Freq: Two times a day (BID) | ORAL | 0 refills | Status: AC

## 2023-03-10 MED ORDER — LOPERAMIDE HCL 2 MG PO CAPS
2.0000 mg | ORAL_CAPSULE | Freq: Once | ORAL | Status: AC
  Administered 2023-03-10: 2 mg via ORAL
  Filled 2023-03-10: qty 1

## 2023-03-10 MED ORDER — ENSURE ENLIVE PO LIQD: 237.0000 mL | Freq: Two times a day (BID) | ORAL | 12 refills | Status: AC

## 2023-03-10 MED ORDER — IPRATROPIUM-ALBUTEROL 0.5-2.5 (3) MG/3ML IN SOLN: 3.0000 mL | Freq: Three times a day (TID) | RESPIRATORY_TRACT | 0 refills | Status: AC

## 2023-03-10 MED ORDER — BUDESONIDE 0.25 MG/2ML IN SUSP: 0.2500 mg | Freq: Two times a day (BID) | RESPIRATORY_TRACT | 12 refills | Status: DC

## 2023-03-10 MED ORDER — SACCHAROMYCES BOULARDII 250 MG PO CAPS
250.0000 mg | ORAL_CAPSULE | Freq: Two times a day (BID) | ORAL | Status: DC
  Administered 2023-03-10: 250 mg via ORAL
  Filled 2023-03-10: qty 1

## 2023-03-10 NOTE — Discharge Summary (Signed)
 Physician Discharge Summary   Patient: [name] [name] MRN: [id number] DOB: [birth year]  Admit date:     02/28/2023  Discharge date: 03/10/23  Discharge Physician: Danette Duos   PCP: Dorthy Gavia, MD   Recommendations at discharge:    Needs follow up with Psychiatrist.  Needs follow up with ENT for evaluation of Vertigo Syncope.  Might benefit from Ozempic   Discharge Diagnoses: Principal Problem:   Influenza A Active Problems:   HTN (hypertension)   MDD (major depressive disorder), recurrent severe, without psychosis (HCC)   CHF (congestive heart failure) (HCC)   Syncope   Acute bronchitis   CAD S/P percutaneous coronary angioplasty   BMI 60.0-69.9, adult (HCC)   Morbid obesity with BMI of 60.0-69.9, adult (HCC)   Bipolar I disorder, current or most recent episode manic, severe (HCC)  Resolved Problems:   * No resolved hospital problems. *  Hospital Course: 48 year old with past medical history significant for CAD s/p PCI, CHF, depression, history of seizure , who presents with cough, congestion, wheezing admitted for severe bronchitis, found to have influenza A.  Found to have multiple episode of syncope in the hospital      Assessment and Plan: 1-Acute bronchitis with influenza A -Did not receive Tamiflu given onset of symptoms more than 5 days. -Continue with the scheduled DuoNeb -Continue guaifenesin , Brovana  and Pulmicort  Improved. Plan to change IV solumedrol to prednisone    2-Syncope: -? vasovagal in the setting of poor oral intake dehydration and influenza, Vertigo related syncope?.  -Echo without significant abnormalities. -Reported episode of syncope observed by staff onto his last 2/3 -No events noticed on telemetry, Cardiology sign off.  -Orthostatic Vital negative.  -Discussed with Neurology, recommendation for CTA head and neck to look for large vessel occlusion, vertebrobasilar stenosis, subclavian steal syndrome.  CTA negative for large  vessel occlusion. -MRI negative -If her syncopal is positional she might need follow-up with ENT Stable, no recent episodes.    Cough, Chocking episode.  She suddenly develops worsening dyspnea, was chocking on crackers.  After strenuous cough and palpation to her back she was feeling better.  Plan to check chest x ray  Received a nebulizer tx   Transaminases: In the setting of viral illness   CAD status post PCI stable   Depression, bipolar, borderline schizophrenia: Psych  was consulted patient reporter auditory hallucination Appreciate psych assistance.  Started on Abilify  and prazosin  Received IM Abilify . Psych recommend 2 weeks of oral Abilify    Mild hypokalemia: Resolved   Seizure: Takes gabapentin    Morbid obesity BMI 65: Need lifestyle modification         Estimated body mass index is 65.32 kg/m as calculated from the following:   Height as of this encounter: 5' 2.25" (1.581 m).   Weight as of this encounter: 163.3 kg.           Consultants: Cardiology, Neurology )phone) Procedures performed: ECHO Disposition: Home Diet recommendation:  Discharge Diet Orders (From admission, onward)     Start     Ordered   03/10/23 0000  Diet - low sodium heart healthy        03/10/23 1023           Cardiac diet DISCHARGE MEDICATION: Allergies as of 03/10/2023       Reactions   Bee Venom Anaphylaxis, Rash   Sulfa Antibiotics Anaphylaxis   Ultram  [tramadol ] Other (See Comments)   Migraines        Medication List  STOP taking these medications    FLUoxetine  40 MG capsule Commonly known as: PROZAC    methocarbamol  500 MG tablet Commonly known as: ROBAXIN    topiramate  100 MG tablet Commonly known as: TOPAMAX    zolpidem  5 MG tablet Commonly known as: AMBIEN        TAKE these medications    albuterol  108 (90 Base) MCG/ACT inhaler Commonly known as: VENTOLIN  HFA Inhale 2 puffs into the lungs every 6 (six) hours as needed for wheezing or  shortness of breath (SOB).   arformoterol  15 MCG/2ML Nebu Commonly known as: BROVANA  Take 2 mLs (15 mcg total) by nebulization 2 (two) times daily.   ARIPiprazole  15 MG tablet Commonly known as: ABILIFY  Take 1 tablet (15 mg total) by mouth daily for 14 days.   aspirin  EC 81 MG tablet Take 1 tablet (81 mg total) by mouth daily. Swallow whole. Start taking on: March 11, 2023   atorvastatin  40 MG tablet Commonly known as: LIPITOR Take 1 tablet (40 mg total) by mouth daily.   benzonatate  200 MG capsule Commonly known as: TESSALON  Take 1 capsule (200 mg total) by mouth 3 (three) times daily.   budesonide  0.25 MG/2ML nebulizer solution Commonly known as: PULMICORT  Take 2 mLs (0.25 mg total) by nebulization 2 (two) times daily.   feeding supplement Liqd Take 237 mLs by mouth 2 (two) times daily between meals.   gabapentin  600 MG tablet Commonly known as: NEURONTIN  Take 600 mg by mouth 3 (three) times daily.   guaiFENesin  600 MG 12 hr tablet Commonly known as: MUCINEX  Take 2 tablets (1,200 mg total) by mouth 2 (two) times daily.   ipratropium-albuterol  0.5-2.5 (3) MG/3ML Soln Commonly known as: DUONEB Take 3 mLs by nebulization 3 (three) times daily.   lamoTRIgine  25 MG tablet Commonly known as: LAMICTAL  Take 1 tablet (25 mg total) by mouth 2 (two) times daily.   loperamide  2 MG capsule Commonly known as: IMODIUM  Take 1 capsule (2 mg total) by mouth as needed for diarrhea or loose stools.   meclizine  25 MG tablet Commonly known as: ANTIVERT  Take 1 tablet (25 mg total) by mouth 3 (three) times daily as needed for dizziness.   multivitamin Tabs tablet Take 1 tablet by mouth daily. Start taking on: March 11, 2023   oxyCODONE -acetaminophen  5-325 MG tablet Commonly known as: PERCOCET/ROXICET Take 1 tablet by mouth every 8 (eight) hours as needed for up to 3 days for severe pain (pain score 7-10).   pantoprazole  40 MG tablet Commonly known as: PROTONIX  Take 1  tablet (40 mg total) by mouth daily.   prazosin  1 MG capsule Commonly known as: MINIPRESS  Take 1 capsule (1 mg total) by mouth at bedtime. What changed:  medication strength how much to take   predniSONE  20 MG tablet Commonly known as: DELTASONE  Take 2 tablets (40 mg total) by mouth daily with breakfast for 3 days. Start taking on: March 11, 2023   saccharomyces boulardii 250 MG capsule Commonly known as: FLORASTOR Take 1 capsule (250 mg total) by mouth 2 (two) times daily.               Durable Medical Equipment  (From admission, onward)           Start     Ordered   03/04/23 1144  For home use only DME 4 wheeled rolling walker with seat  Once       Question:  Patient needs a walker to treat with the following condition  Answer:  Ambulatory  dysfunction   03/04/23 1143   03/03/23 1516  For home use only DME Nebulizer machine  Once       Question Answer Comment  Patient needs a nebulizer to treat with the following condition Bronchitis   Length of Need 6 Months   Additional equipment included Administration kit   Additional equipment included Filter      03/03/23 1515            Follow-up Information     Dorthy Gavia, MD Follow up in 1 week(s).   Specialty: Internal Medicine Contact information: [address] [place] [address] [phone]                Discharge Exam: Cleavon Curls Weights   02/28/23 1609  Weight: (!) 163.3 kg   General NAD  Condition at discharge: stable  The results of significant diagnostics from this hospitalization (including imaging, microbiology, ancillary and laboratory) are listed below for reference.   Imaging Studies: DG CHEST PORT 1 VIEW Result Date: 03/09/2023 CLINICAL DATA:  Cough, congestion, wheezing. EXAM: PORTABLE CHEST 1 VIEW COMPARISON:  Chest x-ray and CT chest dated February 28, 2023. FINDINGS: Stable cardiomediastinal silhouette. Low lung volumes are present, causing crowding of the pulmonary  vasculature. No focal consolidation, pleural effusion, or pneumothorax. No acute osseous abnormality. IMPRESSION: 1. Low lung volumes. No active disease. Electronically Signed   By: Aleta Anda M.D.   On: 03/09/2023 17:40   MR BRAIN WO CONTRAST Result Date: 03/07/2023 CLINICAL DATA:  Initial evaluation for acute syncope/presyncope, CVA suspected. EXAM: MRI HEAD WITHOUT CONTRAST TECHNIQUE: Multiplanar, multiecho pulse sequences of the brain and surrounding structures were obtained without intravenous contrast. COMPARISON:  CT from 03/06/2023. FINDINGS: Brain: Cerebral volume within normal limits. Scattered patchy T2/FLAIR hyperintensity involving the supratentorial cerebral white matter, most likely related chronic microvascular ischemic disease, mild in nature. No evidence for acute or subacute infarct. Gray-white matter differentiation maintained. No areas of chronic cortical infarction. No acute or chronic intracranial products. No mass lesion, midline shift or mass effect. No hydrocephalus or extra-axial fluid collection. Pituitary gland and suprasellar region within normal limits. Vascular: Major intracranial vascular flow voids are maintained. Skull and upper cervical spine: Craniocervical junction within normal limits. Decreased T1 signal intensity noted within the visualized bone marrow, nonspecific, but most commonly related to anemia, smoking or obesity. No scalp soft tissue abnormality. Sinuses/Orbits: Globes and orbital soft tissues within normal limits. Scattered mucosal thickening noted throughout the paranasal sinuses. No significant mastoid effusion. Other: None. IMPRESSION: 1. No acute intracranial abnormality. 2. Mild chronic microvascular ischemic disease. Electronically Signed   By: Virgia Griffins M.D.   On: 03/07/2023 21:14   CT ANGIO HEAD NECK W WO CM Result Date: 03/06/2023 CLINICAL DATA:  Syncope/presyncope. Cerebrovascular cause suspected. EXAM: CT ANGIOGRAPHY HEAD AND NECK WITH  AND WITHOUT CONTRAST TECHNIQUE: Multidetector CT imaging of the head and neck was performed using the standard protocol during bolus administration of intravenous contrast. Multiplanar CT image reconstructions and MIPs were obtained to evaluate the vascular anatomy. Carotid stenosis measurements (when applicable) are obtained utilizing NASCET criteria, using the distal internal carotid diameter as the denominator. RADIATION DOSE REDUCTION: This exam was performed according to the departmental dose-optimization program which includes automated exposure control, adjustment of the mA and/or kV according to patient size and/or use of iterative reconstruction technique. CONTRAST:  75mL OMNIPAQUE  IOHEXOL  350 MG/ML SOLN COMPARISON:  Head CT January 06, 2023. FINDINGS: CT HEAD FINDINGS Brain: No evidence of acute infarction, hemorrhage, hydrocephalus,  extra-axial collection or mass lesion/mass effect. Vascular: No hyperdense vessel or unexpected calcification. Skull: Normal. Negative for fracture or focal lesion. Sinuses/Orbits: Mild mucosal thickening throughout the paranasal sinuses. The orbits are maintained. Other: None. Review of the MIP images confirms the above findings CTA NECK FINDINGS Suboptimal contrast bolus. Aortic arch: Standard branching. Imaged portion shows no evidence of aneurysm or dissection. No significant stenosis of the major arch vessel origins. Right carotid system: No evidence of dissection, stenosis (50% or greater), or occlusion. Left carotid system: No evidence of dissection, stenosis (50% or greater), or occlusion. Vertebral arteries: Suboptimal contrast bolus and artifact from patient's body habitus resulting in and adequate evaluation of the vertebral arteries, particularly at the V1 and proximal V2 segments. Distal V2 and V3 segments appear patent. Skeleton: Negative. Other neck: Negative. Upper chest: Negative. Review of the MIP images confirms the above findings CTA HEAD FINDINGS  Suboptimal contrast bolus. Anterior circulation: No significant stenosis, proximal occlusion, aneurysm, or vascular malformation. Posterior circulation: No significant stenosis, proximal occlusion, aneurysm, or vascular malformation. Venous sinuses: As permitted by contrast timing, patent. 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 Hypoplastic intracranial right vertebral artery. Review of the MIP images confirms the above findings IMPRESSION: 1. No acute intracranial abnormality. 2. Suboptimal contrast bolus. No occlusion of high-grade stenosis of the major neck arteries identified. 3. No significant stenosis, proximal occlusion, or aneurysm in the intracranial circulation. Electronically Signed   By: Katyucia  de Macedo Rodrigues M.D.   On: 03/06/2023 10:58   ECHOCARDIOGRAM COMPLETE Result Date: 03/01/2023    ECHOCARDIOGRAM REPORT   Patient Name:   MELEK KLEMPNER Date of Exam: 03/01/2023 Medical Rec #:  [id number]          Height:       62.2 in Accession #:    [id number]         Weight:       360.0 lb Date of Birth:  [birth year]          BSA:          2.461 m Patient Age:    47 years           BP:           121/81 mmHg Patient Gender: F                  HR:           55 bpm. Exam Location:  Inpatient Procedure: 2D Echo, Color Doppler and Cardiac Doppler Indications:    Syncope R55  History:        Patient has no prior history of Echocardiogram examinations.                 CHF, CAD; Risk Factors:Hypertension.  Sonographer:    Hersey Lorenzo RDCS Referring Phys: [id number] ARSHAD N KAKRAKANDY IMPRESSIONS  1. Left ventricular ejection fraction, by estimation, is 60 to 65%. The left ventricle has normal function. The left ventricle has no regional wall motion abnormalities. Left ventricular diastolic parameters are indeterminate.  2. Right ventricular systolic function is normal. The right ventricular size is mildly enlarged. Tricuspid regurgitation signal is inadequate for assessing PA pressure.   3. The mitral valve is normal in structure. No evidence of mitral valve regurgitation. No evidence of mitral stenosis.  4. The aortic valve was not well visualized. Aortic valve regurgitation is trivial. No aortic stenosis is present.  5. Aortic dilatation noted. There is mild dilatation of the ascending aorta, measuring 41  mm. Comparison(s): No prior Echocardiogram. FINDINGS  Left Ventricle: Left ventricular ejection fraction, by estimation, is 60 to 65%. The left ventricle has normal function. The left ventricle has no regional wall motion abnormalities. The left ventricular internal cavity size was normal in size. There is  no left ventricular hypertrophy. Left ventricular diastolic parameters are indeterminate. Right Ventricle: The right ventricular size is mildly enlarged. No increase in right ventricular wall thickness. Right ventricular systolic function is normal. Tricuspid regurgitation signal is inadequate for assessing PA pressure. Left Atrium: Left atrial size was normal in size. Right Atrium: Right atrial size was normal in size. Pericardium: There is no evidence of pericardial effusion. Mitral Valve: The mitral valve is normal in structure. No evidence of mitral valve regurgitation. No evidence of mitral valve stenosis. Tricuspid Valve: The tricuspid valve is normal in structure. Tricuspid valve regurgitation is not demonstrated. No evidence of tricuspid stenosis. Aortic Valve: The aortic valve was not well visualized. Aortic valve regurgitation is trivial. No aortic stenosis is present. Pulmonic Valve: The pulmonic valve was not well visualized. Pulmonic valve regurgitation is not visualized. No evidence of pulmonic stenosis. Aorta: Aortic dilatation noted and the aortic root is normal in size and structure. There is mild dilatation of the ascending aorta, measuring 41 mm. Venous: The inferior vena cava was not well visualized. IAS/Shunts: The interatrial septum was not well visualized.  LEFT  VENTRICLE PLAX 2D LVIDd:         5.50 cm   Diastology LVIDs:         3.50 cm   LV e' medial:    5.11 cm/s LV PW:         1.10 cm   LV E/e' medial:  18.6 LV IVS:        1.10 cm   LV e' lateral:   7.29 cm/s LVOT diam:     2.30 cm   LV E/e' lateral: 13.0 LV SV:         111 LV SV Index:   45 LVOT Area:     4.15 cm  RIGHT VENTRICLE TAPSE (M-mode): 2.4 cm LEFT ATRIUM             Index LA diam:        3.90 cm 1.58 cm/m LA Vol (A2C):   58.1 ml 23.61 ml/m LA Vol (A4C):   52.3 ml 21.25 ml/m LA Biplane Vol: 57.6 ml 23.41 ml/m  AORTIC VALVE LVOT Vmax:   128.00 cm/s LVOT Vmean:  94.800 cm/s LVOT VTI:    0.266 m  AORTA Ao Root diam: 3.50 cm Ao Asc diam:  4.10 cm MITRAL VALVE MV Area (PHT): 2.97 cm    SHUNTS MV Decel Time: 255 msec    Systemic VTI:  0.27 m MV E velocity: 94.90 cm/s  Systemic Diam: 2.30 cm MV A velocity: 94.90 cm/s MV E/A ratio:  1.00 Vishnu Priya Mallipeddi Electronically signed by Lucetta Russel Mallipeddi Signature Date/Time: 03/01/2023/10:54:04 AM    Final    CT Angio Chest PE W and/or Wo Contrast Result Date: 02/28/2023 CLINICAL DATA:  Suspected pulmonary embolism. EXAM: CT ANGIOGRAPHY CHEST WITH CONTRAST TECHNIQUE: Multidetector CT imaging of the chest was performed using the standard protocol during bolus administration of intravenous contrast. Multiplanar CT image reconstructions and MIPs were obtained to evaluate the vascular anatomy. RADIATION DOSE REDUCTION: This exam was performed according to the departmental dose-optimization program which includes automated exposure control, adjustment of the mA and/or kV according to patient size and/or use of iterative  reconstruction technique. CONTRAST:  75mL OMNIPAQUE  IOHEXOL  350 MG/ML SOLN COMPARISON:  October 06, 2022 FINDINGS: Cardiovascular: The thoracic aorta is normal in appearance. Satisfactory opacification of the pulmonary arteries to the segmental level. No evidence of pulmonary embolism. Normal heart size. No pericardial effusion.  Mediastinum/Nodes: No enlarged mediastinal, hilar, or axillary lymph nodes. Thyroid gland, trachea, and esophagus demonstrate no significant findings. Lungs/Pleura: There is no evidence of acute infiltrate. Mild to moderate severity predominately central and perihilar peribronchial thickening is noted. No pleural effusion or pneumothorax. Upper Abdomen: Multiple surgical clips are seen within the gallbladder fossa. Musculoskeletal: No chest wall abnormality. No acute or significant osseous findings. Review of the MIP images confirms the above findings. IMPRESSION: 1. No evidence of pulmonary embolism. 2. Mild to moderate severity bronchitis. 3. Evidence of prior cholecystectomy. Electronically Signed   By: Virgle Grime M.D.   On: 02/28/2023 21:30   DG Chest Portable 1 View Result Date: 02/28/2023 CLINICAL DATA:  Shortness of breath EXAM: PORTABLE CHEST 1 VIEW COMPARISON:  12/22/2022 FINDINGS: Numerous leads and wires project over the chest. Apical lordotic positioning. Midline trachea. Cardiomegaly accentuated by AP portable technique. Mildly degraded exam due to AP portable technique and patient body habitus. No pleural effusion or pneumothorax. No congestive failure. IMPRESSION: Cardiomegaly without congestive failure. Electronically Signed   By: Lore Rode M.D.   On: 02/28/2023 17:53   DG Lumbar Spine Complete Result Date: 02/23/2023 CLINICAL DATA:  fall.  Bilateral hip and tailbone pain. EXAM: LUMBAR SPINE - COMPLETE 4+ VIEW; SACRUM AND COCCYX - 2+ VIEW COMPARISON:  None Available. FINDINGS: There are 5 nonrib-bearing lumbar vertebrae. Anatomic lumbar curvature. No spondylolysis or spondylolisthesis. Vertebral body heights are maintained. No aggressive osseous lesion. Mild multilevel degenerative changes in the form of facet arthropathy and marginal osteophyte formation. Normal and symmetric bilateral sacroiliac joints. Visualized sacral arcuate lines are intact. Visualized soft tissues are within  normal limits. IMPRESSION: *No acute osseous abnormality of the lumbar spine or sacrum/coccyx. *Mild multilevel degenerative changes. Electronically Signed   By: Beula Brunswick M.D.   On: 02/23/2023 15:41   DG Sacrum/Coccyx Result Date: 02/23/2023 CLINICAL DATA:  fall.  Bilateral hip and tailbone pain. EXAM: LUMBAR SPINE - COMPLETE 4+ VIEW; SACRUM AND COCCYX - 2+ VIEW COMPARISON:  None Available. FINDINGS: There are 5 nonrib-bearing lumbar vertebrae. Anatomic lumbar curvature. No spondylolysis or spondylolisthesis. Vertebral body heights are maintained. No aggressive osseous lesion. Mild multilevel degenerative changes in the form of facet arthropathy and marginal osteophyte formation. Normal and symmetric bilateral sacroiliac joints. Visualized sacral arcuate lines are intact. Visualized soft tissues are within normal limits. IMPRESSION: *No acute osseous abnormality of the lumbar spine or sacrum/coccyx. *Mild multilevel degenerative changes. Electronically Signed   By: Beula Brunswick M.D.   On: 02/23/2023 15:41    Microbiology: Results for orders placed or performed during the hospital encounter of 02/28/23  SARS Coronavirus 2 by RT PCR (hospital order, performed in [facility] hospital lab) *cepheid single result test* Anterior Nasal Swab     Status: None   Collection Time: 02/28/23  4:35 PM   Specimen: Anterior Nasal Swab  Result Value Ref Range Status   SARS Coronavirus 2 by RT PCR NEGATIVE NEGATIVE Final    Comment: (NOTE) SARS-CoV-2 target nucleic acids are NOT DETECTED.  The SARS-CoV-2 RNA is generally detectable in upper and lower respiratory specimens during the acute phase of infection. The lowest concentration of SARS-CoV-2 viral copies this assay can detect is 250 copies / mL. A negative  result does not preclude SARS-CoV-2 infection and should not be used as the sole basis for treatment or other patient management decisions.  A negative result may occur with improper specimen  collection / handling, submission of specimen other than nasopharyngeal swab, presence of viral mutation(s) within the areas targeted by this assay, and inadequate number of viral copies (<250 copies / mL). A negative result must be combined with clinical observations, patient history, and epidemiological information.  Fact Sheet for Patients:   RoadLapTop.co.za  Fact Sheet for Healthcare Providers: http://kim-miller.com/  This test is not yet approved or  cleared by the [place]  FDA and has been authorized for detection and/or diagnosis of SARS-CoV-2 by FDA under an Emergency Use Authorization (EUA).  This EUA will remain in effect (meaning this test can be used) for the duration of the COVID-19 declaration under Section 564(b)(1) of the Act, 21 U.S.C. section 360bbb-3(b)(1), unless the authorization is terminated or revoked sooner.  Performed at [facility], [address], [place], [place] [address]   Resp panel by RT-PCR (RSV, Flu A&B, Covid) Anterior Nasal Swab     Status: Abnormal   Collection Time: 02/28/23  7:27 PM   Specimen: Anterior Nasal Swab  Result Value Ref Range Status   SARS Coronavirus 2 by RT PCR NEGATIVE NEGATIVE Final    Comment: (NOTE) SARS-CoV-2 target nucleic acids are NOT DETECTED.  The SARS-CoV-2 RNA is generally detectable in upper respiratory specimens during the acute phase of infection. The lowest concentration of SARS-CoV-2 viral copies this assay can detect is 138 copies/mL. A negative result does not preclude SARS-Cov-2 infection and should not be used as the sole basis for treatment or other patient management decisions. A negative result may occur with  improper specimen collection/handling, submission of specimen other than nasopharyngeal swab, presence of viral mutation(s) within the areas targeted by this assay, and inadequate number of viral copies(<138 copies/mL). A  negative result must be combined with clinical observations, patient history, and epidemiological information. The expected result is Negative.  Fact Sheet for Patients:  BloggerCourse.com  Fact Sheet for Healthcare Providers:  SeriousBroker.it  This test is no t yet approved or cleared by the [place]  FDA and  has been authorized for detection and/or diagnosis of SARS-CoV-2 by FDA under an Emergency Use Authorization (EUA). This EUA will remain  in effect (meaning this test can be used) for the duration of the COVID-19 declaration under Section 564(b)(1) of the Act, 21 U.S.C.section 360bbb-3(b)(1), unless the authorization is terminated  or revoked sooner.       Influenza A by PCR POSITIVE (A) NEGATIVE Final   Influenza B by PCR NEGATIVE NEGATIVE Final    Comment: (NOTE) The Xpert Xpress SARS-CoV-2/FLU/RSV plus assay is intended as an aid in the diagnosis of influenza from Nasopharyngeal swab specimens and should not be used as a sole basis for treatment. Nasal washings and aspirates are unacceptable for Xpert Xpress SARS-CoV-2/FLU/RSV testing.  Fact Sheet for Patients: BloggerCourse.com  Fact Sheet for Healthcare Providers: SeriousBroker.it  This test is not yet approved or cleared by the [place]  FDA and has been authorized for detection and/or diagnosis of SARS-CoV-2 by FDA under an Emergency Use Authorization (EUA). This EUA will remain in effect (meaning this test can be used) for the duration of the COVID-19 declaration under Section 564(b)(1) of the Act, 21 U.S.C. section 360bbb-3(b)(1), unless the authorization is terminated or revoked.     Resp Syncytial Virus by PCR NEGATIVE NEGATIVE Final  Comment: (NOTE) Fact Sheet for Patients: BloggerCourse.com  Fact Sheet for Healthcare  Providers: SeriousBroker.it  This test is not yet approved or cleared by the [place]  FDA and has been authorized for detection and/or diagnosis of SARS-CoV-2 by FDA under an Emergency Use Authorization (EUA). This EUA will remain in effect (meaning this test can be used) for the duration of the COVID-19 declaration under Section 564(b)(1) of the Act, 21 U.S.C. section 360bbb-3(b)(1), unless the authorization is terminated or revoked.  Performed at [facility], [address], [place], [place] [address]     Labs: CBC: No results for input(s): "WBC", "NEUTROABS", "HGB", "HCT", "MCV", "PLT" in the last 168 hours. Basic Metabolic Panel: Recent Labs  Lab 03/05/23 0536 03/08/23 0738  NA 139  --   K 4.5  --   CL 105  --   CO2 25  --   GLUCOSE 85  --   BUN 21*  --   CREATININE 0.43* 0.73  CALCIUM  8.7*  --   MG 2.4  --   PHOS 3.4  --    Liver Function Tests: No results for input(s): "AST", "ALT", "ALKPHOS", "BILITOT", "PROT", "ALBUMIN" in the last 168 hours. CBG: Recent Labs  Lab 03/03/23 1056  GLUCAP 113*    Discharge time spent: greater than 30 minutes.  Signed: Danette Duos, MD [facility] 03/10/2023

## 2023-03-10 NOTE — Plan of Care (Signed)

## 2023-03-10 NOTE — TOC Transition Note (Signed)
 Transition of Care [facility]) - Discharge Note   Patient Details  Name: [name] [name] MRN: [id number] Date of Birth: [birth year]  Transition of Care [facility]) CM/SW Contact:  Amaryllis Junior, LCSW Phone Number: 03/10/2023, 2:15 PM   Clinical Narrative:     Pt d/c to return to pallet home. Outpatient PT services set up and added to AVS. Nebulizer and Rollator provided by [employer]. Resources for psychiatric services added to AVS. No further TOC needs.    Final next level of care: Homeless Shelter Barriers to Discharge: Barriers Resolved   Patient Goals and CMS Choice Patient states their goals for this hospitalization and ongoing recovery are:: Return to baseline CMS Medicare.gov Compare Post Acute Care list provided to:: Patient Choice offered to / list presented to : Patient  ownership interest in [facility].provided to:: Patient    Discharge Placement                       Discharge Plan and Services Additional resources added to the After Visit Summary for   In-house Referral: Clinical Social Work              DME Arranged: Community education officer, Environmental consultant rolling with seat DME Agency: [employer] Date DME Agency Contacted: 03/10/23 Time DME Agency Contacted: 1030 Representative spoke with at DME Agency: Zula Hitch            Social Drivers of Health (SDOH) Interventions SDOH Screenings   Food Insecurity: No Food Insecurity (03/01/2023)  Housing: High Risk (03/04/2023)  Transportation Needs: No Transportation Needs (03/04/2023)  Recent Concern: Transportation Needs - Unmet Transportation Needs (03/01/2023)  Utilities: Not At Risk (03/01/2023)  Alcohol Screen: Low Risk  (09/24/2022)  Depression (PHQ2-9): High Risk (12/31/2022)  Social Connections: Unknown (06/11/2021)   Received from [facility], [facility]  Tobacco Use: Low Risk  (03/01/2023)     Readmission Risk Interventions    03/03/2023    2:55 PM  Readmission Risk Prevention Plan   Transportation Screening Complete  Medication Review (RN Care Manager) Complete  PCP or Specialist appointment within 3-5 days of discharge Complete  HRI or Home Care Consult Complete  SW Recovery Care/Counseling Consult Complete  Palliative Care Screening Complete  Skilled Nursing Facility Complete

## 2023-03-17 ENCOUNTER — Encounter: Payer: MEDICAID | Admitting: Student

## 2023-03-19 ENCOUNTER — Ambulatory Visit: Payer: MEDICAID

## 2023-03-24 ENCOUNTER — Encounter: Payer: MEDICAID | Admitting: Student

## 2023-03-25 ENCOUNTER — Ambulatory Visit: Payer: MEDICAID | Attending: Internal Medicine | Admitting: Physical Therapy

## 2023-04-04 ENCOUNTER — Ambulatory Visit: Payer: MEDICAID | Attending: Physician Assistant | Admitting: Physician Assistant

## 2023-04-04 ENCOUNTER — Ambulatory Visit: Payer: MEDICAID | Admitting: Physician Assistant

## 2023-04-06 NOTE — Progress Notes (Signed)
 This encounter was created in error - please disregard.

## 2023-04-10 ENCOUNTER — Ambulatory Visit (INDEPENDENT_AMBULATORY_CARE_PROVIDER_SITE_OTHER): Payer: MEDICAID | Admitting: Student

## 2023-04-10 VITALS — BP 111/92 | HR 76 | Ht 62.25 in | Wt 350.9 lb

## 2023-04-10 DIAGNOSIS — R55 Syncope and collapse: Secondary | ICD-10-CM | POA: Diagnosis not present

## 2023-04-10 DIAGNOSIS — F39 Unspecified mood [affective] disorder: Secondary | ICD-10-CM

## 2023-04-10 DIAGNOSIS — J45909 Unspecified asthma, uncomplicated: Secondary | ICD-10-CM

## 2023-04-10 MED ORDER — ALBUTEROL SULFATE HFA 108 (90 BASE) MCG/ACT IN AERS
2.0000 | INHALATION_SPRAY | Freq: Four times a day (QID) | RESPIRATORY_TRACT | 0 refills | Status: DC | PRN
Start: 2023-04-10 — End: 2023-05-07

## 2023-04-10 MED ORDER — LAMOTRIGINE 25 MG PO TABS: 25.0000 mg | ORAL_TABLET | Freq: Two times a day (BID) | ORAL | 1 refills | Status: AC

## 2023-04-10 MED ORDER — PRAZOSIN HCL 1 MG PO CAPS: 1.0000 mg | ORAL_CAPSULE | Freq: Every day | ORAL | 2 refills | Status: AC

## 2023-04-10 MED ORDER — BUDESONIDE-FORMOTEROL FUMARATE 80-4.5 MCG/ACT IN AERO: 2.0000 | INHALATION_SPRAY | Freq: Every day | RESPIRATORY_TRACT | 12 refills | Status: AC

## 2023-04-10 NOTE — Patient Instructions (Signed)
 Thank you so much for coming to the clinic today!   We will see you in about one month to check in.   If you have any questions please feel free to the call the clinic at anytime at [phone]. It was a pleasure seeing you!  Best, Dr. Rayvon Char

## 2023-04-10 NOTE — Progress Notes (Signed)
 CC: Hospital follow-up  HPI: [name] is a 48 y.o. female living with a history stated below and presents today for hospital follow-up. Please see problem based assessment and plan for additional details.  Past Medical History:  Diagnosis Date   Asthma    Bipolar 1 disorder (HCC)    Cancer (HCC)    CHF (congestive heart failure) (HCC)    Coronary artery disease    Depression    Hypertension    Obesity    PTSD (post-traumatic stress disorder)    Seizures (HCC)     Current Outpatient Medications on File Prior to Visit  Medication Sig Dispense Refill   ARIPiprazole (ABILIFY) 15 MG tablet Take 1 tablet (15 mg total) by mouth daily for 14 days. 14 tablet 0   aspirin EC 81 MG tablet Take 1 tablet (81 mg total) by mouth daily. Swallow whole. 30 tablet 12   atorvastatin (LIPITOR) 40 MG tablet Take 1 tablet (40 mg total) by mouth daily. 90 tablet 3   benzonatate (TESSALON) 200 MG capsule Take 1 capsule (200 mg total) by mouth 3 (three) times daily. 20 capsule 0   feeding supplement (ENSURE ENLIVE / ENSURE PLUS) LIQD Take 237 mLs by mouth 2 (two) times daily between meals. 237 mL 12   gabapentin (NEURONTIN) 600 MG tablet Take 600 mg by mouth 3 (three) times daily.     guaiFENesin (MUCINEX) 600 MG 12 hr tablet Take 2 tablets (1,200 mg total) by mouth 2 (two) times daily. 120 tablet 0   ipratropium-albuterol (DUONEB) 0.5-2.5 (3) MG/3ML SOLN Take 3 mLs by nebulization 3 (three) times daily. 270 mL 0   loperamide (IMODIUM) 2 MG capsule Take 1 capsule (2 mg total) by mouth as needed for diarrhea or loose stools. 30 capsule 0   meclizine (ANTIVERT) 25 MG tablet Take 1 tablet (25 mg total) by mouth 3 (three) times daily as needed for dizziness. 30 tablet 0   multivitamin (PROSIGHT) TABS tablet Take 1 tablet by mouth daily. 30 tablet 0   pantoprazole (PROTONIX) 40 MG tablet Take 1 tablet (40 mg total) by mouth daily. 90 tablet 3   saccharomyces boulardii (FLORASTOR) 250 MG capsule Take  1 capsule (250 mg total) by mouth 2 (two) times daily. 60 capsule 0   No current facility-administered medications on file prior to visit.    No family history on file.  Social History   Socioeconomic History   Marital status: Single    Spouse name: Not on file   Number of children: 7   Years of education: Not on file   Highest education level: Not on file  Occupational History   Occupation: Disabled  Tobacco Use   Smoking status: Never   Smokeless tobacco: Never  Vaping Use   Vaping status: Never Used  Substance and Sexual Activity   Alcohol use: Yes    Comment: occ   Drug use: No   Sexual activity: Not on file  Other Topics Concern   Not on file  Social History Narrative   Not on file   Social Drivers of Health   Financial Resource Strain: Not on file  Food Insecurity: No Food Insecurity (03/01/2023)   Hunger Vital Sign    Worried About Running Out of Food in the Last Year: Never true    Ran Out of Food in the Last Year: Never true  Transportation Needs: No Transportation Needs (03/04/2023)   PRAPARE - Administrator, Civil Service (Medical):  No    Lack of Transportation (Non-Medical): No  Recent Concern: Transportation Needs - Unmet Transportation Needs (03/01/2023)   PRAPARE - Administrator, Civil Service (Medical): Yes    Lack of Transportation (Non-Medical): Yes  Physical Activity: Not on file  Stress: Not on file  Social Connections: Unknown (06/11/2021)   Received from [facility], [facility]   Social Network    Social Network: Not on file  Intimate Partner Violence: Not At Risk (03/01/2023)   Humiliation, Afraid, Rape, and Kick questionnaire    Fear of Current or Ex-Partner: No    Emotionally Abused: No    Physically Abused: No    Sexually Abused: No    Review of Systems: ROS negative except for what is noted on the assessment and plan.  Vitals:   04/10/23 1308 04/10/23 1316  BP: (!) 121/108 (!) 111/92  Pulse: 85 76   SpO2: 95%   Weight: (!) 350 lb 14.4 oz (159.2 kg)   Height: 5' 2.25" (1.581 m)     Physical Exam: Constitutional: well-appearing in no acute distress HENT: normocephalic atraumatic, mucous membranes moist Eyes: conjunctiva non-erythematous Neck: supple Cardiovascular: regular rate and rhythm, no m/r/g Pulmonary/Chest: normal work of breathing on room air, lungs clear to auscultation bilaterally Abdominal: soft, non-tender, non-distended MSK: normal bulk and tone Neurological: alert & oriented x 3, 5/5 strength in bilateral upper and lower extremities, normal gait Skin: warm and dry Psych: Pressured speech with tearful affect  Assessment & Plan:   Syncope Patient is presenting for hospital follow-up.  She was discharged on 03/10/2023.  This was initially thought to have been secondary from poor p.o. intake in the context of her influenza.  Echo at this time was unremarkable.  Episodes thought to have been positional in nature.   Patient endorses history of vertigo.  She also notes that these episodes occur when she moves her head suddenly from side-to-side.  Since she has been discharged, she has only had 2 episodes.  Overall, she feels that she has been improving.  Will follow-up with her in 1 month.  If her signs and symptoms do not improve, may consider an ENT referral at her next visit.  - Follow-up in 1 month (if still symptomatic, and consider ENT referral)  Mood disorder [facility]) Psychiatry consulted during hospitalization for pressured speech, hallucinations and mood swings.  She completed a 2-week dose of Abilify 50 mg.  She also started prazosin 1 mg daily and lamotrigine 25 mg twice daily.  She was unable to follow-up with psychiatry.  I will put another referral for her.  As patient is not literate, I also let the scheduling coordinator know that patient needs to be called to communicate her schedule.  - Continue lamotrigine 25 mg twice daily and prazosin 1 mg daily - Follow-up  psychiatry referral  Asthma, chronic Chronic.  Sent in a refill for her as needed albuterol.  I also sent in a prescription for Symbicort 80-4.5 daily.  Will follow-up in 1 month to reevaluate.  Patient discussed with Dr. Bary Leriche, MD  [facility] Internal Medicine, PGY-1 Date 04/10/2023 Time 3:15 PM

## 2023-04-10 NOTE — Assessment & Plan Note (Signed)
 Psychiatry consulted during hospitalization for pressured speech, hallucinations and mood swings.  She completed a 2-week dose of Abilify 50 mg.  She also started prazosin 1 mg daily and lamotrigine 25 mg twice daily.  She was unable to follow-up with psychiatry.  I will put another referral for her.  As patient is not literate, I also let the scheduling coordinator know that patient needs to be called to communicate her schedule.  - Continue lamotrigine 25 mg twice daily and prazosin 1 mg daily - Follow-up psychiatry referral

## 2023-04-10 NOTE — Assessment & Plan Note (Signed)
 Chronic.  Sent in a refill for her as needed albuterol.  I also sent in a prescription for Symbicort 80-4.5 daily.  Will follow-up in 1 month to reevaluate.

## 2023-04-10 NOTE — Assessment & Plan Note (Signed)
 Patient is presenting for hospital follow-up.  She was discharged on 03/10/2023.  This was initially thought to have been secondary from poor p.o. intake in the context of her influenza.  Echo at this time was unremarkable.  Episodes thought to have been positional in nature.   Patient endorses history of vertigo.  She also notes that these episodes occur when she moves her head suddenly from side-to-side.  Since she has been discharged, she has only had 2 episodes.  Overall, she feels that she has been improving.  Will follow-up with her in 1 month.  If her signs and symptoms do not improve, may consider an ENT referral at her next visit.  - Follow-up in 1 month (if still symptomatic, and consider ENT referral)

## 2023-04-15 NOTE — Addendum Note (Signed)
 Addended by: Burnell Blanks on: 04/15/2023 09:10 AM   Modules accepted: Level of Service

## 2023-04-15 NOTE — Progress Notes (Signed)
 Internal Medicine Clinic Attending  Case discussed with the resident at the time of the visit.  We reviewed the resident's history and exam and pertinent patient test results.  I agree with the assessment, diagnosis, and plan of care documented in the resident's note.

## 2023-04-22 ENCOUNTER — Other Ambulatory Visit: Payer: Self-pay | Admitting: Student

## 2023-04-22 NOTE — Telephone Encounter (Signed)
 Medication discontinued 03/02/2023 by a provider not with [facility].

## 2023-04-29 ENCOUNTER — Ambulatory Visit (HOSPITAL_COMMUNITY): Payer: MEDICAID | Admitting: Clinical

## 2023-04-29 ENCOUNTER — Encounter (HOSPITAL_COMMUNITY): Payer: Self-pay

## 2023-05-07 ENCOUNTER — Other Ambulatory Visit: Payer: Self-pay | Admitting: Student

## 2023-05-07 DIAGNOSIS — J45909 Unspecified asthma, uncomplicated: Secondary | ICD-10-CM

## 2023-05-12 ENCOUNTER — Encounter: Payer: MEDICAID | Admitting: Student

## 2023-05-15 ENCOUNTER — Ambulatory Visit (HOSPITAL_COMMUNITY): Payer: MEDICAID | Admitting: Clinical

## 2023-06-17 ENCOUNTER — Ambulatory Visit (HOSPITAL_COMMUNITY): Payer: MEDICAID | Admitting: Clinical

## 2023-06-17 NOTE — Progress Notes (Signed)
 Patient did not connect for virtual psychiatric medication management appointment on 06/18/23 at 1PM. Sent secure video link with no response. Left VM with callback number to reschedule.  Adell Hones, MD 06/18/23

## 2023-06-18 ENCOUNTER — Encounter (HOSPITAL_COMMUNITY): Payer: Self-pay

## 2023-06-18 ENCOUNTER — Encounter (HOSPITAL_COMMUNITY): Payer: MEDICAID | Admitting: Psychiatry

## 2023-06-19 ENCOUNTER — Encounter: Payer: Self-pay | Admitting: Cardiovascular Disease

## 2023-07-04 ENCOUNTER — Encounter: Payer: Self-pay | Admitting: *Deleted

## 8386-09-29 DEATH — deceased
